# Patient Record
Sex: Female | Born: 1945 | Race: White | Hispanic: No | Smoking: Current every day smoker
Health system: Southern US, Community
[De-identification: ages and names within clinical notes are randomized; demographics above are authoritative.]

## PROBLEM LIST (undated history)

## (undated) DIAGNOSIS — R7303 Prediabetes: Secondary | ICD-10-CM

## (undated) DIAGNOSIS — E785 Hyperlipidemia, unspecified: Secondary | ICD-10-CM

## (undated) DIAGNOSIS — K219 Gastro-esophageal reflux disease without esophagitis: Secondary | ICD-10-CM

## (undated) DIAGNOSIS — K220 Achalasia of cardia: Secondary | ICD-10-CM

## (undated) DIAGNOSIS — C49A4 Gastrointestinal stromal tumor of large intestine: Secondary | ICD-10-CM

## (undated) DIAGNOSIS — Z803 Family history of malignant neoplasm of breast: Secondary | ICD-10-CM

## (undated) DIAGNOSIS — R0600 Dyspnea, unspecified: Secondary | ICD-10-CM

## (undated) DIAGNOSIS — J40 Bronchitis, not specified as acute or chronic: Secondary | ICD-10-CM

## (undated) DIAGNOSIS — Z8049 Family history of malignant neoplasm of other genital organs: Secondary | ICD-10-CM

## (undated) DIAGNOSIS — Z800 Family history of malignant neoplasm of digestive organs: Secondary | ICD-10-CM

## (undated) DIAGNOSIS — Z801 Family history of malignant neoplasm of trachea, bronchus and lung: Secondary | ICD-10-CM

## (undated) DIAGNOSIS — M1990 Unspecified osteoarthritis, unspecified site: Secondary | ICD-10-CM

## (undated) DIAGNOSIS — N6021 Fibroadenosis of right breast: Secondary | ICD-10-CM

## (undated) HISTORY — DX: Gastrointestinal stromal tumor of large intestine: C49.A4

## (undated) HISTORY — PX: COLONOSCOPY: SHX174

## (undated) HISTORY — DX: Family history of malignant neoplasm of digestive organs: Z80.0

## (undated) HISTORY — DX: Family history of malignant neoplasm of other genital organs: Z80.49

## (undated) HISTORY — DX: Achalasia of cardia: K22.0

## (undated) HISTORY — DX: Unspecified osteoarthritis, unspecified site: M19.90

## (undated) HISTORY — PX: OTHER SURGICAL HISTORY: SHX169

## (undated) HISTORY — DX: Hyperlipidemia, unspecified: E78.5

## (undated) HISTORY — DX: Gastro-esophageal reflux disease without esophagitis: K21.9

## (undated) HISTORY — PX: UMBILICAL HERNIA REPAIR: SHX196

## (undated) HISTORY — PX: ESOPHAGOGASTRODUODENOSCOPY: SHX1529

## (undated) HISTORY — DX: Fibroadenosis of right breast: N60.21

## (undated) HISTORY — DX: Family history of malignant neoplasm of breast: Z80.3

## (undated) HISTORY — DX: Family history of malignant neoplasm of trachea, bronchus and lung: Z80.1

---

## 2013-01-28 HISTORY — PX: COLONOSCOPY: SHX174

## 2013-01-28 HISTORY — PX: ESOPHAGOGASTRODUODENOSCOPY: SHX1529

## 2017-03-14 HISTORY — PX: CATARACT EXTRACTION: SUR2

## 2017-03-28 HISTORY — PX: CATARACT EXTRACTION: SUR2

## 2018-08-30 HISTORY — PX: OTHER SURGICAL HISTORY: SHX169

## 2019-04-24 DIAGNOSIS — C49A4 Gastrointestinal stromal tumor of large intestine: Secondary | ICD-10-CM

## 2019-04-24 HISTORY — PX: UPPER ESOPHAGEAL ENDOSCOPIC ULTRASOUND (EUS): SHX6562

## 2019-04-24 HISTORY — DX: Gastrointestinal stromal tumor of large intestine: C49.A4

## 2019-04-24 HISTORY — PX: ESOPHAGOGASTRODUODENOSCOPY: SHX1529

## 2019-06-11 HISTORY — PX: OTHER SURGICAL HISTORY: SHX169

## 2019-06-14 LAB — PULMONARY FUNCTION TEST

## 2019-06-19 HISTORY — PX: HELLER MYOTOMY: SHX5259

## 2019-06-19 HISTORY — PX: OTHER SURGICAL HISTORY: SHX169

## 2019-09-06 HISTORY — PX: BREAST BIOPSY: SHX20

## 2019-11-14 ENCOUNTER — Encounter: Payer: Self-pay | Admitting: Internal Medicine

## 2020-01-01 ENCOUNTER — Encounter: Payer: Self-pay | Admitting: Gastroenterology

## 2020-01-16 NOTE — Progress Notes (Signed)
Referring Provider: Leonie Douglas, MD Primary Care Physician:  Leonie Douglas, MD Primary Gastroenterologist:  Dr. Gala Romney  Chief Complaint  Patient presents with  . Colonoscopy    HPI:   Cassandra Fowler is a 75 y.o. female presenting today at the request of Leonie Douglas, MD for consult colonoscopy, history of achalasia, and history of GIST.  Patient recently moved from [place] to [place], [place] in October 2021.  All prior GI work-up was completed in [place] either at [facility], [place] [facility], or [facility].  She reports having dysphagia to foods and liquids with sensation of items getting hung in the upper esophagus requiring regurgitation at times.  She has had EGD, swallowing study, manometry, hiatal hernia repair/Niesen fundoplication for GERD, and what sounds like esophageal myotomy.  After esophageal myotomy, she had improvement in dysphagia for couple of months, but symptoms returned.  She has never had esophageal dilation.  She was told her esophagus moves slowly.  Prior to Nissen fundoplication, she had significant acid reflux.  This is much improved with occasional breakthrough symptoms that are well controlled with Tums as needed.  Regarding history of GIST, this was an incidental finding.  States she had 2 GIST tumors in her stomach that were resected laparoscopically in June 2021 at [facility] in [place].  Reports one tumor was 7 cm and the second tumor was 2 cm.  She is following with oncology in [place] who had recommended routine "blood draws and scans" 4 times per year x1 year, 3 times per year x1 year, 2 times per year x1 year, and so long.  She was due for her first scan in October 2021, but she was moving at that time so she was unable to complete this.  She needs referral to oncology locally.  Denies nausea, vomiting, abdominal pain.  Occasional constipation which is well managed with fig bars.  No  diarrhea.  No BRBPR or melena.   Last colonoscopy was at [facility] in [place].  Thinks this may have been 4-5 years ago.  Not sure if she had polyps and does not remember when she was to have repeat colonoscopy.  No unintentional weight loss. Trying to lose weight.  Reports she has lost about 30 pounds over the last year or so.  Limiting fried foods. Eating smaller portions. States she is follow her daughters diet but is not able to give me much information on this. Not eating chocolate daily as she had been.   Past Medical History:  Diagnosis Date  . Achalasia   . Arthritis   . GERD (gastroesophageal reflux disease)   . GIST (gastrointestinal stroma tumor), malignant, colon [facility])    s/p resection of 2 tumors in June 2021 at [facility] in [place]  . HLD (hyperlipidemia)     Past Surgical History:  Procedure Laterality Date  . COLONOSCOPY    . ESOPHAGOGASTRODUODENOSCOPY    . GIST tumor resection  06/2019  . left hand surgery    . tubal ligation    . UMBILICAL HERNIA REPAIR     1970s    Current Outpatient Medications  Medication Sig Dispense Refill  . acetaminophen (TYLENOL) 500 MG tablet Take 500 mg by mouth as needed.    . Ascorbic Acid (VITAMIN C) 1000 MG tablet Take 1,000 mg by mouth daily.    . Cholecalciferol (D3) 50 MCG (2000 UT) TABS Take 1 tablet by mouth daily.    . Ibuprofen 200 MG CAPS Take by mouth  as needed.    . imatinib (GLEEVEC) 400 MG tablet Take 400 mg by mouth daily. Take with meals and large glass of water.Caution:Chemotherapy.    . rosuvastatin (CRESTOR) 10 MG tablet Take 10 mg by mouth daily.     No current facility-administered medications for this visit.    Allergies as of 01/17/2020 - Review Complete 01/17/2020  Allergen Reaction Noted  . Indocin [indomethacin]  01/17/2020  . Tolectin [tolmetin]  01/17/2020  . Wound dressing adhesive  01/17/2020  . Penicillins Swelling and Rash 01/17/2020    Family History  Problem  Relation Age of Onset  . Stomach cancer Mother   . Lung cancer Brother   . Colon cancer Paternal Grandmother        in 89s    Social History   Socioeconomic History  . Marital status: Single    Spouse name: Not on file  . Number of children: Not on file  . Years of education: Not on file  . Highest education level: Not on file  Occupational History  . Not on file  Tobacco Use  . Smoking status: Current Every Day Smoker    Types: Cigarettes  . Smokeless tobacco: Never Used  Vaping Use  . Vaping Use: Never used  Substance and Sexual Activity  . Alcohol use: Yes    Comment: glass of wine once a month   . Drug use: Never  . Sexual activity: Not on file  Other Topics Concern  . Not on file  Social History Narrative  . Not on file   Social Determinants of Health   Financial Resource Strain: Not on file  Food Insecurity: Not on file  Transportation Needs: Not on file  Physical Activity: Not on file  Stress: Not on file  Social Connections: Not on file  Intimate Partner Violence: Not on file    Review of Systems: Gen: Denies any fever, chills, cold or flulike symptoms, lightheadedness, dizziness, presyncope, syncope. CV: Denies chest pain or palpitations. Resp: Denies shortness of breath or cough. GI: See HPI GU : Denies urinary burning, urinary frequency, urinary hesitancy MS: Diffuse intermittent joint pain related to arthritis. Derm: Denies rash Heme: See HPI  Physical Exam: BP 136/80   Pulse 78   Temp (!) 97.3 F (36.3 C) (Temporal)   Ht 4' 10"  (1.473 m)   Wt 170 lb 12.8 oz (77.5 kg)   BMI 35.70 kg/m  General:   Alert and oriented. Pleasant and cooperative. Well-nourished and well-developed.  Head:  Normocephalic and atraumatic. Eyes:  Without icterus, sclera clear and conjunctiva pink.  Ears: Decreased auditory acuity. Lungs:  Clear to auscultation bilaterally. No wheezes, rales, or rhonchi. No distress.  Heart:  S1, S2 present without murmurs  appreciated.  Abdomen:  +BS, soft, non-tender and non-distended. No HSM noted. No guarding or rebound. No masses appreciated.  Rectal:  Deferred  Msk:  Symmetrical without gross deformities. Normal posture. Extremities:  Without edema. Neurologic:  Alert and  oriented x4;  grossly normal neurologically. Skin:  Intact without significant lesions or rashes. Psych: Normal mood and affect.

## 2020-01-17 ENCOUNTER — Encounter: Payer: Self-pay | Admitting: Gastroenterology

## 2020-01-17 ENCOUNTER — Ambulatory Visit (INDEPENDENT_AMBULATORY_CARE_PROVIDER_SITE_OTHER): Payer: Medicare Other | Admitting: Gastroenterology

## 2020-01-17 ENCOUNTER — Other Ambulatory Visit: Payer: Self-pay

## 2020-01-17 DIAGNOSIS — C49A4 Gastrointestinal stromal tumor of large intestine: Secondary | ICD-10-CM | POA: Diagnosis not present

## 2020-01-17 DIAGNOSIS — Z1211 Encounter for screening for malignant neoplasm of colon: Secondary | ICD-10-CM | POA: Diagnosis not present

## 2020-01-17 DIAGNOSIS — K219 Gastro-esophageal reflux disease without esophagitis: Secondary | ICD-10-CM | POA: Insufficient documentation

## 2020-01-17 DIAGNOSIS — R1310 Dysphagia, unspecified: Secondary | ICD-10-CM | POA: Diagnosis not present

## 2020-01-17 HISTORY — DX: Dysphagia, unspecified: R13.10

## 2020-01-17 NOTE — Assessment & Plan Note (Addendum)
Patient reports dysphagia to foods and liquids with history of what sounds like achalasia with prior evaluation and intervention in [place].  No records to review today.  She reports having EGD, swallowing study, esophageal manometry, hiatal hernia repair/Nissen fundoplication, and what sounds like esophageal myotomy last year.  Reports dysphagia symptoms initially improved after myotomy for couple of months, but she had return of symptoms.  Interestingly, she describes the sensation of foods or liquids getting stuck in her upper esophagus rather than lower esophagus and requiring regurgitation. No prior esophageal dilation.   Plan: BPE for further evaluation. Requesting records from [place] for review. Advised to avoid tough textures All meats should be chopped finely Eat slowly, take small bites, chew thoroughly, and drink liquids throughout meals. Do not lie down within 3 hours of eating. Further recommendations to follow.

## 2020-01-17 NOTE — Patient Instructions (Signed)
Will refer you to oncology to follow-up on history of GIST.  We will arrange for her to have a swallowing study at [facility].  I recommend you avoid tough textures. All meats should be chopped finely. Eat slowly, take small bites, chew thoroughly, and drink liquids throughout your meals. Sit upright for 3 hours after meals. If something were to get hung in your esophagus and not come up or go down, proceed to the emergency room.  Follow a GERD diet:  Avoid fried, fatty, greasy, spicy, citrus foods. Avoid caffeine and carbonated beverages. Avoid chocolate. Try eating 4-6 small meals a day rather than 3 large meals. Do not eat within 3 hours of laying down. Prop head of bed up on wood or bricks to create a 6 inch incline.  Continue using Tums as needed for acid reflux.  We are requesting records from [place].  Upon reviewing these, we will have additional recommendations.  It was nice meeting you today!  Aliene Altes, PA-C [facility]

## 2020-01-17 NOTE — Assessment & Plan Note (Addendum)
75 year old female reporting history of 2 GIST tumors found incidentally in her stomach that were resected in June 2021 at [facility] in [place].  Currently on Gleevec.  Reports she was following with oncology in [place] and advised to repeat "scans and blood draws" 4 times per year x1, 3 times per year x1, 2 times per year x1, and so on.  States she was due for her first scan in October 2021, but she was moving from [place] to [place], [place] at that time.  She needs to establish with an oncologist locally.  In general, she is doing well from a GI standpoint other than ongoing dysphagia as discussed above.   We will refer her to oncology. Also requesting records from [place].

## 2020-01-17 NOTE — Assessment & Plan Note (Addendum)
75 year old female referred to [place] today for consult colonoscopy.  Reports her last colonoscopy was a few years ago in [place].  Unclear if she had any colon polyps or when she was to have repeat exam.  Notes occasional constipation that she manages with fig bars.  No other significant lower GI symptoms.  No alarm symptoms.  Paternal grandmother with history of colon cancer in her 22s.  Plan: Request records from [place]. Discussed Benefiber or Metamucil daily to help with bowel regularity.  Patient states she is pleased with how fig bars are working for her.  Further recommendations to follow review of records.

## 2020-01-17 NOTE — Assessment & Plan Note (Signed)
History of GERD.  Patient reports undergoing hernia repair/Niesen fundoplication last year.  GERD symptoms have essentially resolved.  Occasional breakthrough symptoms that are managed well with Tums.  Advise she continue to use Tums as needed and counseled on GERD diet/lifestyle.

## 2020-01-22 ENCOUNTER — Ambulatory Visit (HOSPITAL_COMMUNITY)
Admission: RE | Admit: 2020-01-22 | Discharge: 2020-01-22 | Disposition: A | Discharge status: Home / Self Care | Payer: Medicare Other | Source: Ambulatory Visit | Attending: Gastroenterology | Admitting: Gastroenterology

## 2020-01-22 ENCOUNTER — Other Ambulatory Visit: Payer: Self-pay

## 2020-01-22 DIAGNOSIS — R1310 Dysphagia, unspecified: Secondary | ICD-10-CM | POA: Diagnosis present

## 2020-01-23 ENCOUNTER — Encounter: Payer: Self-pay | Admitting: Gastroenterology

## 2020-01-27 ENCOUNTER — Telehealth: Payer: Self-pay | Admitting: Gastroenterology

## 2020-01-27 DIAGNOSIS — K7581 Nonalcoholic steatohepatitis (NASH): Secondary | ICD-10-CM

## 2020-01-27 NOTE — Telephone Encounter (Signed)
Received and reviewed several results from [facility] in [place], [place] as described below.  EUS with fine-needle biopsy, through-the-scope balloon dilation, and cold forceps biopsy 04/24/2019. Indication: Gastric submucosal mass, dysphagia. Endoscopic findings: Esophagus: The examined esophagus was endoscopically tortuous, dilated with a through-the-scope balloon along the entire esophagus to 18 mm with no effect.  Biopsies from mid and distal esophagus for eosinophilic esophagitis.  No evidence of Barrett's esophagus on my exam. Stomach: The examined stomach had a slightly visible submucosal mass at the fundus. Duodenum: The examined duodenum had endoscopically normal mucosa.  There was a diverticulum near the ampulla. Endosonographic findings: - In the fundus, there is a 2.4 cm x 2.4 cm submucosal mass arising from the muscularis.  It abutted the left liver lobe, but did not appear to invade it.  Fine-needle biopsy was performed. -No endosonographic abnormality in the ampulla. -No sign of endosonographic abnormality in the CBD, common hepatic duct, bifurcation of the common hepatic duct, intrahepatic ducts, or cystic.  CBD measured 5 mm. -Hyperechoic, shadowing structures were seen in the gallbladder, consistent with cholelithiasis. -No sign of significant endosonographic abnormality in the visualized portion of the liver. -No sign of significant endosonographic abnormality in the pancreas head,genu of the pancreas, pancreatic body, pancreatic tail, uncinate process of the pancreas, and pancreatic neck.  Main pancreatic duct measures 1 mm in size in the head. -No lymphadenopathy. Final impression: 1.  2.4 cm x 2.4 cm gastric submucosal mass in the fundus of the stomach.  Likely a GIST.  About of the liver.  Fine-needle biopsy performed and final pathology results pending. 2.  Cholelithiasis. 3.  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  Biopsies obtained and dilation  performed.  Duodenal diverticulum. Pathology: Fine-needle aspiration of gastric submucosal mass: Gastrointestinal stromal tumor, immunohistochemical stains supportive of the diagnosis.  Cores of hepatic parenchyma with:   Steatosis, moderate to severe, predominantly macrovesicular.   Changes consistent with chronic steatohepatitis, mild.   Focal pericellular and sinusoidal fibrosis, consistent with mild steato-fibrosis. Esophagus, distal: Compatible with reflux, no EOE or intraepithelial eosinophils identified. Esophagus, mid: Compatible with reflux, no EOE or intraepithelial eosinophils identified.  Esophagram 05/20/2019: Findings: There are predominant tertiary contractions in the mid and distal esophagus.  There was relative stasis of the contrast media in the esophagus.  There is a small hiatal hernia.  There is no mass or stricture.  There was spontaneous gastroesophageal reflux. Impression: There are tertiary contractions in the mid and distal esophagus and there is a small hiatal hernia.  There is gastroesophageal reflux.  CT chest/abdomen/pelvis 05/30/2019 Impression: 1.  4 x 2 x 2 centimeter exophytic mass arising from the fundal region of the stomach. 2.  No evidence of metastatic disease within the abdomen or pelvis. 3.  There is a 6 mm solitary pulmonary nodule in the left upper lobe which is nonspecific in appearance.  Short-term follow-up of this nodule is suggested. 4.  CBD is dilated..  Recommended correlating with LFTs.  Per findings, evidence of fatty liver, normal spleen, CBD measures 10 mm.  Gallbladder is contracted.  No evidence of pancreatic mass.  States the bile duct dilation may be related to postinflammatory stricture at the ampulla.  06/19/2019: Patient underwent robotic gastric tumor resection of the stomach x2, robotic gastric mobilization with takedown of short gastrics with vessel sealer, hiatal hernia repair, Heller myotomy, dor fundoplication, and EGD.  Repeat EGD  at the end of surgery states endoscope was inserted under direct facilitation into the esophagus and  advanced to the stomach.  No appreciable hiatal hernia.  Loose dor fundoplication was intact.  Staple line was intact with no bleeding.  Mass appeared to have been completely resected. Pathology: Gastric tumor small: GIST, 0.7 cm, negative margins of resection. Gastric tumor, large: GIST, 4.7 cm, negative margins of resection.  Esophagram 06/20/2019: Findings: Esophagus is normal in course and caliber.  No obstruction or stricture.  No esophageal mucosal irregularity or diverticula.  Contrast passes unobstructed through the gastroesophageal junction into the stomach.  There is no extravasated contrast to indicate leak.  No concerning findings in the imaged portion of the proximal stomach.  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  No other findings.  Alicia: Please let patient know I have reviewed results from [facility] in regards to GIST tumor resection and achalasia.  1. Appears margins were negative at the time of GIST resection in June 2021, and she had no evidence of metastatic disease on her CT scans.  We have referred her to oncology for ongoing monitoring. No additional recommendations for history of GIST at this time.  2.  She had Heller myotomy for achalasia.  I ordered BPE at office visit which revealed esophageal dysmotility and slight dilation of lower esophagus.  I have sent message to Dr. Laural Golden to inquire about additional options for management of achalasia.  Further recommendations to follow. 3.  Appears some liver tissue was obtained at the time of biopsying GIST tumor.  Liver pathology with chronic steatohepatitis and mild fibrosis. Suspect this is likely secondary to chronic fatty liver. To follow-up on this, I would like to obtain abdominal US with elastography and update labs including CBC and CMP.  4. On her CT scan in May, her common bile duct was dilated. We will be able to  reassess this with the [place].   Alicia: Please arrange CBC and CMP. Dx: steatohepatitis and mild steato-fibrosis on liver biopsy.  [facility] Clinical Pool: Please arrange US abdomen complete with elastography. Dx: steatohepatitis and mild steato-fibrosis on liver biopsy.   Manuela Schwartz: We have not received colonoscopy records. Patient reported colonoscopy was completed at [facility] in [place]. Did we request these results?

## 2020-01-29 ENCOUNTER — Other Ambulatory Visit: Payer: Self-pay

## 2020-01-29 DIAGNOSIS — D214 Benign neoplasm of connective and other soft tissue of abdomen: Secondary | ICD-10-CM

## 2020-01-29 DIAGNOSIS — K760 Fatty (change of) liver, not elsewhere classified: Secondary | ICD-10-CM

## 2020-01-29 NOTE — Telephone Encounter (Signed)
Noted  

## 2020-01-29 NOTE — Telephone Encounter (Signed)
Spoke with pt. Pt was notified that results were reviewed from [facility]. Pt is going to complete labs as directed at [facility] hospital and will complete the U/s abd complete with electrography. Pt doesn't want to have u/s this week due to the weather. Lab orders faxed to [facility] lab

## 2020-01-29 NOTE — Telephone Encounter (Signed)
[place] scheduled for 1/25 at 10:30am, arrival 10:15am, npo midnight Called pt, left detailed message on VM with appt details. Also provided CS # to r/s if needed.

## 2020-01-29 NOTE — Addendum Note (Signed)
Addended by: Cheron Every on: 01/29/2020 12:03 PM   Modules accepted: Orders

## 2020-01-29 NOTE — Telephone Encounter (Signed)
I have faxed another release of records for her last TCS and path from [facility].

## 2020-02-04 ENCOUNTER — Other Ambulatory Visit (HOSPITAL_COMMUNITY)
Admission: RE | Admit: 2020-02-04 | Discharge: 2020-02-04 | Disposition: A | Discharge status: Home / Self Care | Payer: Medicare Other | Source: Ambulatory Visit | Attending: Gastroenterology | Admitting: Gastroenterology

## 2020-02-04 ENCOUNTER — Telehealth: Payer: Self-pay | Admitting: Gastroenterology

## 2020-02-04 ENCOUNTER — Ambulatory Visit (HOSPITAL_COMMUNITY)
Admission: RE | Admit: 2020-02-04 | Discharge: 2020-02-04 | Disposition: A | Discharge status: Home / Self Care | Payer: Medicare Other | Source: Ambulatory Visit | Attending: Gastroenterology | Admitting: Gastroenterology

## 2020-02-04 ENCOUNTER — Encounter: Payer: Self-pay | Admitting: Gastroenterology

## 2020-02-04 ENCOUNTER — Other Ambulatory Visit: Payer: Self-pay

## 2020-02-04 DIAGNOSIS — D214 Benign neoplasm of connective and other soft tissue of abdomen: Secondary | ICD-10-CM | POA: Insufficient documentation

## 2020-02-04 DIAGNOSIS — K760 Fatty (change of) liver, not elsewhere classified: Secondary | ICD-10-CM | POA: Insufficient documentation

## 2020-02-04 DIAGNOSIS — K7581 Nonalcoholic steatohepatitis (NASH): Secondary | ICD-10-CM | POA: Diagnosis not present

## 2020-02-04 LAB — COMPREHENSIVE METABOLIC PANEL
ALT: 15 U/L (ref 0–44)
AST: 14 U/L — ABNORMAL LOW (ref 15–41)
Albumin: 4.1 g/dL (ref 3.5–5.0)
Alkaline Phosphatase: 52 U/L (ref 38–126)
Anion gap: 8 (ref 5–15)
BUN: 11 mg/dL (ref 8–23)
CO2: 27 mmol/L (ref 22–32)
Calcium: 9.2 mg/dL (ref 8.9–10.3)
Chloride: 105 mmol/L (ref 98–111)
Creatinine, Ser: 0.88 mg/dL (ref 0.44–1.00)
GFR, Estimated: >60 mL/min (ref >60)
Glucose, Bld: 103 mg/dL — ABNORMAL HIGH (ref 70–99)
Potassium: 3.9 mmol/L (ref 3.5–5.1)
Sodium: 140 mmol/L (ref 135–145)
Total Bilirubin: 0.9 mg/dL (ref 0.3–1.2)
Total Protein: 6.8 g/dL (ref 6.5–8.1)

## 2020-02-04 LAB — CBC WITH DIFFERENTIAL/PLATELET
Abs Immature Granulocytes: 0.04 10*3/uL (ref 0.00–0.07)
Basophils Absolute: 0 10*3/uL (ref 0.0–0.1)
Basophils Relative: 0 %
Eosinophils Absolute: 0.2 10*3/uL (ref 0.0–0.5)
Eosinophils Relative: 2 %
HCT: 40.4 % (ref 36.0–46.0)
Hemoglobin: 13.5 g/dL (ref 12.0–15.0)
Immature Granulocytes: 0 %
Lymphocytes Relative: 18 %
Lymphs Abs: 1.7 10*3/uL (ref 0.7–4.0)
MCH: 32.8 pg (ref 26.0–34.0)
MCHC: 33.4 g/dL (ref 30.0–36.0)
MCV: 98.3 fL (ref 80.0–100.0)
Monocytes Absolute: 0.7 10*3/uL (ref 0.1–1.0)
Monocytes Relative: 7 %
Neutro Abs: 7.2 10*3/uL (ref 1.7–7.7)
Neutrophils Relative %: 73 %
Platelets: 244 10*3/uL (ref 150–400)
RBC: 4.11 MIL/uL (ref 3.87–5.11)
RDW: 14.2 % (ref 11.5–15.5)
WBC: 9.9 10*3/uL (ref 4.0–10.5)
nRBC: 0 % (ref 0.0–0.2)

## 2020-02-04 NOTE — Telephone Encounter (Signed)
Noted. Will call patient once we receive March schedule

## 2020-02-04 NOTE — Telephone Encounter (Signed)
Received and reviewed colonoscopy report from [facility] in [place], [place]  Colonoscopy 01/28/2013 by Dr. Ok Edwards Indication: Screening Summary: Normal cecum, normal transverse colon, normal descending colon, normal rectum, single flat polyp found in the mid ascending colon removed, single polyp in the mid sigmoid colon removed, 2 polyps in the distal sigmoid removed mild diverticulosis in the sigmoid colon, single polyp in the colon. Recommendations: Repeat colonoscopy in 3 years  Pathology: Sigmoid colon polyp: Hyperplastic colonic polyp Right colon polyp: Sessile serrated adenoma Sigmoid colon, sessile serrated adenoma Polyps, distal sigmoid colon: Diminutive tubular adenoma with low-grade dysplasia, sessile; excised in its entirety.  A hyperplastic colonic polyp with some features suggesting possible development of sessile serrated adenoma; however, these features are not well-developed.  EGD 01/28/2013 Indication: Intermittent pharyngeal esophageal dysphagia Summary: Normal proximal third of the esophagus, middle third of the esophagus, and distal third of the esophagus.  A hiatus hernia was found at the GE junction.  Normal antrum, body of stomach, fundus, and pylorus.  Normal duodenal bulb, second portion of the duodenum, and third portion of the duodenum.  Does not appear dilation was performed.  I discussed case with Dr. Gala Romney who states we can offer patient EGD with large bore dilation although not sure if this would be helpful and certainly not definitive therapy. Did not recommend nitrates or calcium channel blockers.  Recommended further evaluation of dysphagia [facility]. He did say she would need periodic EGDs due to increased risk of esophageal cancer and GIST surveillance.  Discussed with patient. She would like to try esophageal dilation and go from there. She is over due for repeat colonoscopy and would like to have both procedures completed at the same time.  [facility]  Clinical Pool: Please arrange EGD + dilation + colonoscopy with propofol with Dr. Gala Romney. ASA II Dx: History of colon polyps, achalasia, dysphagia, history of GIST *Patient prefers to be scheduled in mid to latter part of March 2022. * Propofol due to prior procedures being completed with propofol.

## 2020-02-05 NOTE — Telephone Encounter (Signed)
PA submitted via [facility] website. Pending review. Tracking# [id number]

## 2020-02-05 NOTE — Telephone Encounter (Signed)
Called pt. She has been scheduled for 3/24, am appt. Aware she will be told by hospital time to arrive. Advised will mail prep instructions with covid test appt. Confirmed mailing address.

## 2020-02-06 NOTE — Telephone Encounter (Signed)
PA approved. Auth# [id number] DOS 04/02/2020-07/01/20

## 2020-02-10 ENCOUNTER — Inpatient Hospital Stay (HOSPITAL_COMMUNITY): Payer: Medicare Other | Admitting: Hematology

## 2020-03-05 ENCOUNTER — Other Ambulatory Visit: Payer: Self-pay

## 2020-03-05 ENCOUNTER — Inpatient Hospital Stay (HOSPITAL_COMMUNITY): Payer: Medicare Other | Attending: Hematology | Admitting: Hematology

## 2020-03-05 VITALS — BP 154/83 | HR 70 | Temp 98.1°F | Resp 17 | Ht <= 58 in | Wt 166.2 lb

## 2020-03-05 DIAGNOSIS — E785 Hyperlipidemia, unspecified: Secondary | ICD-10-CM | POA: Diagnosis not present

## 2020-03-05 DIAGNOSIS — N911 Secondary amenorrhea: Secondary | ICD-10-CM

## 2020-03-05 DIAGNOSIS — K219 Gastro-esophageal reflux disease without esophagitis: Secondary | ICD-10-CM

## 2020-03-05 DIAGNOSIS — Z79899 Other long term (current) drug therapy: Secondary | ICD-10-CM | POA: Diagnosis not present

## 2020-03-05 DIAGNOSIS — N6091 Unspecified benign mammary dysplasia of right breast: Secondary | ICD-10-CM | POA: Diagnosis not present

## 2020-03-05 DIAGNOSIS — F17210 Nicotine dependence, cigarettes, uncomplicated: Secondary | ICD-10-CM

## 2020-03-05 DIAGNOSIS — Z800 Family history of malignant neoplasm of digestive organs: Secondary | ICD-10-CM | POA: Insufficient documentation

## 2020-03-05 DIAGNOSIS — R911 Solitary pulmonary nodule: Secondary | ICD-10-CM | POA: Diagnosis not present

## 2020-03-05 DIAGNOSIS — C49A2 Gastrointestinal stromal tumor of stomach: Secondary | ICD-10-CM | POA: Insufficient documentation

## 2020-03-05 DIAGNOSIS — Z803 Family history of malignant neoplasm of breast: Secondary | ICD-10-CM | POA: Diagnosis not present

## 2020-03-05 DIAGNOSIS — Z801 Family history of malignant neoplasm of trachea, bronchus and lung: Secondary | ICD-10-CM | POA: Diagnosis not present

## 2020-03-05 DIAGNOSIS — Z8509 Personal history of malignant neoplasm of other digestive organs: Secondary | ICD-10-CM

## 2020-03-05 NOTE — Patient Instructions (Signed)
[place] at [facility] Discharge Instructions  You were seen and examined today by Dr. Delton Coombes. Dr. Delton Coombes is a medical oncologist, meaning he specializes in the management of cancer diagnoses with medication. Dr. Delton Coombes discussed your past medical history, family history of cancer and the events that led to you being here today. You were referred to Dr. Delton Coombes to establish ongoing oncology care for your known GIST.  Dr. Delton Coombes has recommended at CT of your chest, abdomen and pelvis just for surveillance. Dr. Delton Coombes also discussed genetic testing with you. This involves meeting with a genetic counselor and a blood test to see if there are any genetic markers that place you at an increased risk for cancer development. This could be helpful for future treatment options as well as helpful for your family.  Follow-up as scheduled.   Thank you for choosing [place] at [facility] to provide your oncology and hematology care.  To afford each patient quality time with our provider, please arrive at least 15 minutes before your scheduled appointment time.   If you have a lab appointment with the [facility] please come in thru the Main Entrance and check in at the main information desk.  You need to re-schedule your appointment should you arrive 10 or more minutes late.  We strive to give you quality time with our providers, and arriving late affects you and other patients whose appointments are after yours.  Also, if you no show three or more times for appointments you may be dismissed from the clinic at the providers discretion.     Again, thank you for choosing [facility].  Our hope is that these requests will decrease the amount of time that you wait before being seen by our physicians.       _____________________________________________________________  Should you have questions after your visit to [facility], please contact our office at [phone] and follow the prompts.  Our office hours are 8:00 a.m. and 4:30 p.m. Monday - Friday.  Please note that voicemails left after 4:00 p.m. may not be returned until the following business day.  We are closed weekends and major holidays.  You do have access to a nurse 24-7, just call the main number to the clinic [phone] and do not press any options, hold on the line and a nurse will answer the phone.    For prescription refill requests, have your pharmacy contact our office and allow 72 hours.    Due to Covid, you will need to wear a mask upon entering the hospital. If you do not have a mask, a mask will be given to you at the Main Entrance upon arrival. For doctor visits, patients may have 1 support person age 60 or older with them. For treatment visits, patients can not have anyone with them due to social distancing guidelines and our immunocompromised population.

## 2020-03-05 NOTE — Progress Notes (Signed)
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 75 y.o. 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 Manus Rudd. She is scheduled to have a colonoscopy with Dr. Gala Romney on 03/24. She had gastric resection in 06/2019 in [facility] in [place], subsequently followed by oncology in [place] and is currently taking Gleevec.  Today she reports feeling well. She is taking Gleevec 400 mg which she started around 06/2019 and tolerating it well. She notes having occasional leg swelling but denies having nausea. She denies having any changes in her overall health. She denies having orthopnea, PND, history of MI's or CVA. She did not go through genetic testing.  She moved to [facility] in 10/2019. She is currently retired; she used to work as a Quarry manager in [employer]. She quit smoking in 2019 and started back up on 10/2019 smoking 1 PPD. Mother had colon cancer; brother died from lung cancer; 2 maternal aunts had breast cancer; PGM had colon cancer; daughter had vaginal and uterine cancer and breast cancer.  MEDICAL HISTORY:  Past Medical History:  Diagnosis Date  . Achalasia   . Arthritis   . GERD (gastroesophageal reflux disease)   . GIST (gastrointestinal stroma tumor), malignant, colon ([place]) 04/24/2019   s/p resection of 2 tumors in June 2021 at [facility] in [place]  . HLD (hyperlipidemia)     SURGICAL HISTORY: Past Surgical History:  Procedure Laterality Date  . BREAST BIOPSY Right 09/06/2019   [facility]; evaluation of suspicious calcification of right breast; pathology with atypical ductal hyperplasia, focal cystic  dilation of ducts with columnar cell change of epithelium, presence of microcalcifications.  Marland Kitchen CATARACT EXTRACTION Right 03/28/2017   [facility]  . CATARACT EXTRACTION Left 03/14/2017   [facility]  . COLONOSCOPY  01/28/2013   Dr. Ok Edwards, [place], [place]; Hyperplastic polyp x2, sessile serrated adenoma x2, tubular adenoma with low-grade dysplasia x1.  Recommend repeat colonoscopy in 3 years.  . ESOPHAGOGASTRODUODENOSCOPY  04/24/2019   [place]; 2.4 cm gastric submucosal mass in the fundus consistent with GIST.  FNA consistent with GIST.  Marland Kitchen ESOPHAGOGASTRODUODENOSCOPY  01/28/2013   Dr. Gwen Her Devers in [place], [place]; normal esophagus, hernia at GE junction, normal examined stomach, normal examined duodenum.  Does not appear dilation was performed.  Marland Kitchen GIST tumor resection  06/19/2019   [facility], robotic assisted laparoscopic partial gastrectomy x2.  Pathology consistent with GIST  . HELLER MYOTOMY  06/19/2019   [facility]; esophageal myotomy with hiatal hernia repair and dor fundoplication  . left hand surgery    . tubal ligation    . UMBILICAL HERNIA REPAIR     1970s  . UPPER ESOPHAGEAL ENDOSCOPIC ULTRASOUND (EUS)  04/24/2019   [facility]; endoscopic findings: Tortuous esophagus s/p balloon dilation to 18 mm with no effect s/p biopsy, submucosal mass in the fundus, normal duodenum; endosonographic findings: 2.4 cm x 2.4 cm submucosal mass in the fundus s/p FNA, cholelithiasis, no hepatic, CBD, or pancreatic abnormalities.  Pathology: GIST, hepatic parenchyma with mild steato-fibrosis, reflux esophagitis  . YAG laser posterior capsulotomy Left 08/30/2018   [facility], left eye.  SOCIAL HISTORY: Social History   Socioeconomic History  . Marital status: Single    Spouse name: Not on file  . Number of children: Not on file  . Years of education: Not on file  .  Highest education level: Not on file  Occupational History  . Not on file  Tobacco Use  . Smoking status: Current Every Day Smoker    Types: Cigarettes  . Smokeless tobacco: Never Used  Vaping Use  . Vaping Use: Never used  Substance and Sexual Activity  . Alcohol use: Yes    Comment: glass of wine once a month   . Drug use: Never  . Sexual activity: Not on file  Other Topics Concern  . Not on file  Social History Narrative  . Not on file   Social Determinants of Health   Financial Resource Strain: Not on file  Food Insecurity: Not on file  Transportation Needs: No Transportation Needs  . Lack of Transportation (Medical): No  . Lack of Transportation (Non-Medical): No  Physical Activity: Inactive  . Days of Exercise per Week: 0 days  . Minutes of Exercise per Session: 0 min  Stress: Not on file  Social Connections: Not on file  Intimate Partner Violence: Not At Risk  . Fear of Current or Ex-Partner: No  . Emotionally Abused: No  . Physically Abused: No  . Sexually Abused: No    FAMILY HISTORY: Family History  Problem Relation Age of Onset  . Stomach cancer Mother   . Lung cancer Brother   . Colon cancer Paternal Grandmother        in 90s    ALLERGIES:  is allergic to tape, indomethacin, tolectin [tolmetin], wound dressing adhesive, and penicillins.  MEDICATIONS:  Current Outpatient Medications  Medication Sig Dispense Refill  . acetaminophen (TYLENOL) 500 MG tablet Take 500 mg by mouth as needed.    . calcium carbonate (OSCAL) 1500 (600 Ca) MG TABS tablet 1 tablet with meals    . Cholecalciferol (D3) 50 MCG (2000 UT) TABS Take 1 tablet by mouth daily.    . Ibuprofen 200 MG CAPS Take by mouth as needed.    . imatinib (GLEEVEC) 400 MG tablet Take 400 mg by mouth daily. Take with meals and large glass of water.Caution:Chemotherapy.    . rosuvastatin (CRESTOR) 10 MG tablet Take 10 mg by mouth daily.    . Ascorbic Acid (VITAMIN C) 1000 MG tablet Take 1,000 mg by  mouth daily. (Patient not taking: Reported on 03/05/2020)     No current facility-administered medications for this visit.    REVIEW OF SYSTEMS:   Review of Systems  Constitutional: Positive for fatigue (50%). Negative for appetite change.  Cardiovascular: Positive for leg swelling (occasional).  Gastrointestinal: Negative for nausea.  Psychiatric/Behavioral: Positive for depression.  All other systems reviewed and are negative.    PHYSICAL EXAMINATION: ECOG PERFORMANCE STATUS: 0 - Asymptomatic  Vitals:   03/05/20 1355  BP: (!) 154/83  Pulse: 70  Resp: 17  Temp: 98.1 F (36.7 C)  SpO2: 99%   Filed Weights   03/05/20 1355  Weight: 166 lb 3 oz (75.4 kg)   Physical Exam Vitals reviewed.  Constitutional:      Appearance: Normal appearance. She is obese.  Cardiovascular:     Rate and Rhythm: Normal rate and regular rhythm.     Pulses: Normal pulses.     Heart sounds: Normal heart sounds.  Pulmonary:     Effort: Pulmonary effort is normal.  Breath sounds: Normal breath sounds.  Chest:  Breasts:     Right: No axillary adenopathy or supraclavicular adenopathy.     Left: No axillary adenopathy or supraclavicular adenopathy.    Abdominal:     Palpations: Abdomen is soft. There is no hepatomegaly, splenomegaly or mass.     Tenderness: There is no abdominal tenderness.     Hernia: No hernia is present.  Musculoskeletal:     Right lower leg: No edema.     Left lower leg: No edema.  Lymphadenopathy:     Cervical: No cervical adenopathy.     Upper Body:     Right upper body: No supraclavicular, axillary or pectoral adenopathy.     Left upper body: No supraclavicular, axillary or pectoral adenopathy.  Neurological:     General: No focal deficit present.     Mental Status: She is alert and oriented to person, place, and time.  Psychiatric:        Mood and Affect: Mood normal.        Behavior: Behavior normal.      LABORATORY DATA:  I have reviewed the data as  listed CBC Latest Ref Rng & Units 02/04/2020  WBC 4.0 - 10.5 K/uL 9.9  Hemoglobin 12.0 - 15.0 g/dL 13.5  Hematocrit 36.0 - 46.0 % 40.4  Platelets 150 - 400 K/uL 244   CMP Latest Ref Rng & Units 02/04/2020  Glucose 70 - 99 mg/dL 103(H)  BUN 8 - 23 mg/dL 11  Creatinine 0.44 - 1.00 mg/dL 0.88  Sodium 135 - 145 mmol/L 140  Potassium 3.5 - 5.1 mmol/L 3.9  Chloride 98 - 111 mmol/L 105  CO2 22 - 32 mmol/L 27  Calcium 8.9 - 10.3 mg/dL 9.2  Total Protein 6.5 - 8.1 g/dL 6.8  Total Bilirubin 0.3 - 1.2 mg/dL 0.9  Alkaline Phos 38 - 126 U/L 52  AST 15 - 41 U/L 14(L)  ALT 0 - 44 U/L 15    RADIOGRAPHIC STUDIES: I have personally reviewed the radiological images as listed and agreed with the findings in the report. No results found.  ASSESSMENT:  1.  Multifocal gastric GIST tumors: -[facility] CAP [facility] [place] shows 4 x 2 x 2 cm mass in fundus with no metastatic disease.  6 mm solitary pulmonary nodule in the left upper lobe. -On 06/19/2019-partial gastrectomy x2, hiatal hernia repair, esophageal myotomy -Pathology shows 2 gastrointestinal stromal tumors, smaller lesion measuring 0.7 cm and larger lesion measuring 4.7 cm, mitotic rate less than 5/33m2 for both lesions, low-grade, very low risk, no lymph nodes found (PT 1 and PT 2 N0). -[facility] 400 mg daily.  2.  Social/family history: -She is a retired [employer] manager  She quit smoking 3 years ago, smoked 1 pack/day. -Mother had colon cancer.  Brother had lung cancer.  2 maternal aunts had breast cancer.  Paternal grandmother had colon cancer.  Daughter had breast cancer, vaginal and uterine cancers.  3.  Right breast atypical ductal hyperplasia: -Status post biopsy on 09/06/2019 of indeterminate microcalcifications 7-8:00 consistent with atypical ductal hyperplasia.   PLAN:  1.  Multifocal gastric GIST tumors: -[facility] -I do not have the mutational status reports. -She appears to be in the very low risk range with a 1.9% metastatic rate.  This prognostic assessment  applies best to KIT negative or PDGFR positive GISTs, whereas SDH deficient GISTs are more unpredictable.  Unsure why she was started on imatinib.  It is possible she was part of a clinical trial. -She seems to be tolerating Gleevec without any major problems. -I have reviewed her recent labs from 02/04/2020.  I have recommended CT CAP with contrast for restaging. -Because of her extensive family history, I have also recommended genetic testing. -We will see her back after the scans.   All questions were answered. The patient knows to call the clinic with any problems, questions or concerns.   Derek Jack, MD, 03/05/20 2:35 PM  [place] [phone]   I, Milinda Antis, am acting as a scribe for Dr. Sanda Linger.  I, Derek Jack MD, have reviewed the above documentation for accuracy and completeness, and I agree with the above.

## 2020-03-10 ENCOUNTER — Other Ambulatory Visit: Payer: Self-pay

## 2020-03-10 ENCOUNTER — Encounter (HOSPITAL_COMMUNITY): Payer: Self-pay | Admitting: Emergency Medicine

## 2020-03-10 ENCOUNTER — Inpatient Hospital Stay (HOSPITAL_COMMUNITY)
Admission: EM | Admit: 2020-03-10 | Discharge: 2020-03-13 | DRG: 446 | Disposition: A | Discharge status: Home / Self Care | Payer: Medicare Other | Attending: Internal Medicine | Admitting: Internal Medicine

## 2020-03-10 ENCOUNTER — Emergency Department (HOSPITAL_COMMUNITY): Payer: Medicare Other

## 2020-03-10 DIAGNOSIS — Z8719 Personal history of other diseases of the digestive system: Secondary | ICD-10-CM

## 2020-03-10 DIAGNOSIS — F17210 Nicotine dependence, cigarettes, uncomplicated: Secondary | ICD-10-CM | POA: Diagnosis present

## 2020-03-10 DIAGNOSIS — Z880 Allergy status to penicillin: Secondary | ICD-10-CM

## 2020-03-10 DIAGNOSIS — Z800 Family history of malignant neoplasm of digestive organs: Secondary | ICD-10-CM | POA: Diagnosis not present

## 2020-03-10 DIAGNOSIS — R109 Unspecified abdominal pain: Secondary | ICD-10-CM | POA: Diagnosis present

## 2020-03-10 DIAGNOSIS — Z91048 Other nonmedicinal substance allergy status: Secondary | ICD-10-CM

## 2020-03-10 DIAGNOSIS — Z20822 Contact with and (suspected) exposure to covid-19: Secondary | ICD-10-CM | POA: Diagnosis present

## 2020-03-10 DIAGNOSIS — Z903 Acquired absence of stomach [part of]: Secondary | ICD-10-CM | POA: Diagnosis not present

## 2020-03-10 DIAGNOSIS — K220 Achalasia of cardia: Secondary | ICD-10-CM | POA: Diagnosis present

## 2020-03-10 DIAGNOSIS — K5730 Diverticulosis of large intestine without perforation or abscess without bleeding: Secondary | ICD-10-CM | POA: Diagnosis present

## 2020-03-10 DIAGNOSIS — E785 Hyperlipidemia, unspecified: Secondary | ICD-10-CM | POA: Diagnosis present

## 2020-03-10 DIAGNOSIS — K838 Other specified diseases of biliary tract: Principal | ICD-10-CM

## 2020-03-10 DIAGNOSIS — Z6834 Body mass index (BMI) 34.0-34.9, adult: Secondary | ICD-10-CM | POA: Diagnosis not present

## 2020-03-10 DIAGNOSIS — Z801 Family history of malignant neoplasm of trachea, bronchus and lung: Secondary | ICD-10-CM | POA: Diagnosis not present

## 2020-03-10 DIAGNOSIS — R1310 Dysphagia, unspecified: Secondary | ICD-10-CM | POA: Diagnosis present

## 2020-03-10 DIAGNOSIS — E669 Obesity, unspecified: Secondary | ICD-10-CM | POA: Diagnosis present

## 2020-03-10 DIAGNOSIS — R1011 Right upper quadrant pain: Secondary | ICD-10-CM

## 2020-03-10 DIAGNOSIS — M1990 Unspecified osteoarthritis, unspecified site: Secondary | ICD-10-CM | POA: Diagnosis present

## 2020-03-10 DIAGNOSIS — M62838 Other muscle spasm: Secondary | ICD-10-CM | POA: Diagnosis present

## 2020-03-10 DIAGNOSIS — K2100 Gastro-esophageal reflux disease with esophagitis, without bleeding: Secondary | ICD-10-CM | POA: Diagnosis present

## 2020-03-10 DIAGNOSIS — R10A1 Flank pain, right side: Secondary | ICD-10-CM | POA: Diagnosis present

## 2020-03-10 DIAGNOSIS — D490 Neoplasm of unspecified behavior of digestive system: Secondary | ICD-10-CM | POA: Diagnosis present

## 2020-03-10 DIAGNOSIS — Z9221 Personal history of antineoplastic chemotherapy: Secondary | ICD-10-CM | POA: Diagnosis not present

## 2020-03-10 DIAGNOSIS — Z888 Allergy status to other drugs, medicaments and biological substances status: Secondary | ICD-10-CM | POA: Diagnosis not present

## 2020-03-10 DIAGNOSIS — C49A4 Gastrointestinal stromal tumor of large intestine: Secondary | ICD-10-CM | POA: Diagnosis present

## 2020-03-10 DIAGNOSIS — R001 Bradycardia, unspecified: Secondary | ICD-10-CM | POA: Diagnosis present

## 2020-03-10 DIAGNOSIS — Z85831 Personal history of malignant neoplasm of soft tissue: Secondary | ICD-10-CM

## 2020-03-10 HISTORY — DX: Unspecified abdominal pain: R10.9

## 2020-03-10 HISTORY — DX: Hyperlipidemia, unspecified: E78.5

## 2020-03-10 HISTORY — DX: Other specified diseases of biliary tract: K83.8

## 2020-03-10 LAB — CBC WITH DIFFERENTIAL/PLATELET
Abs Immature Granulocytes: 0.03 10*3/uL (ref 0.00–0.07)
Basophils Absolute: 0 10*3/uL (ref 0.0–0.1)
Basophils Relative: 0 %
Eosinophils Absolute: 0.2 10*3/uL (ref 0.0–0.5)
Eosinophils Relative: 2 %
HCT: 43.4 % (ref 36.0–46.0)
Hemoglobin: 14.4 g/dL (ref 12.0–15.0)
Immature Granulocytes: 0 %
Lymphocytes Relative: 17 %
Lymphs Abs: 1.8 10*3/uL (ref 0.7–4.0)
MCH: 32.4 pg (ref 26.0–34.0)
MCHC: 33.2 g/dL (ref 30.0–36.0)
MCV: 97.5 fL (ref 80.0–100.0)
Monocytes Absolute: 0.8 10*3/uL (ref 0.1–1.0)
Monocytes Relative: 7 %
Neutro Abs: 8.2 10*3/uL — ABNORMAL HIGH (ref 1.7–7.7)
Neutrophils Relative %: 74 %
Platelets: 262 10*3/uL (ref 150–400)
RBC: 4.45 MIL/uL (ref 3.87–5.11)
RDW: 13.8 % (ref 11.5–15.5)
WBC: 11.1 10*3/uL — ABNORMAL HIGH (ref 4.0–10.5)
nRBC: 0 % (ref 0.0–0.2)

## 2020-03-10 LAB — URINALYSIS, ROUTINE W REFLEX MICROSCOPIC
Bilirubin Urine: NEGATIVE
Glucose, UA: NEGATIVE mg/dL
Hgb urine dipstick: NEGATIVE
Ketones, ur: NEGATIVE mg/dL
Leukocytes,Ua: NEGATIVE
Nitrite: NEGATIVE
Protein, ur: NEGATIVE mg/dL
Specific Gravity, Urine: 1.002 — ABNORMAL LOW (ref 1.005–1.030)
pH: 8 (ref 5.0–8.0)

## 2020-03-10 LAB — COMPREHENSIVE METABOLIC PANEL
ALT: 15 U/L (ref 0–44)
AST: 17 U/L (ref 15–41)
Albumin: 4.5 g/dL (ref 3.5–5.0)
Alkaline Phosphatase: 57 U/L (ref 38–126)
Anion gap: 9 (ref 5–15)
BUN: 11 mg/dL (ref 8–23)
CO2: 27 mmol/L (ref 22–32)
Calcium: 9.5 mg/dL (ref 8.9–10.3)
Chloride: 105 mmol/L (ref 98–111)
Creatinine, Ser: 0.76 mg/dL (ref 0.44–1.00)
GFR, Estimated: >60 mL/min (ref >60)
Glucose, Bld: 104 mg/dL — ABNORMAL HIGH (ref 70–99)
Potassium: 4 mmol/L (ref 3.5–5.1)
Sodium: 141 mmol/L (ref 135–145)
Total Bilirubin: 1 mg/dL (ref 0.3–1.2)
Total Protein: 7.4 g/dL (ref 6.5–8.1)

## 2020-03-10 LAB — RESP PANEL BY RT-PCR (FLU A&B, COVID) ARPGX2
Influenza A by PCR: NEGATIVE
Influenza B by PCR: NEGATIVE
SARS Coronavirus 2 by RT PCR: NEGATIVE

## 2020-03-10 LAB — LIPASE, BLOOD: Lipase: 92 U/L — ABNORMAL HIGH (ref 11–51)

## 2020-03-10 MED ORDER — IOHEXOL 300 MG/ML  SOLN
100.0000 mL | Freq: Once | INTRAMUSCULAR | Status: AC | PRN
  Administered 2020-03-10: 100 mL via INTRAVENOUS

## 2020-03-10 MED ORDER — ONDANSETRON HCL 4 MG/2ML IJ SOLN: 4.0000 mg | Freq: Four times a day (QID) | INTRAMUSCULAR | Status: DC | PRN

## 2020-03-10 MED ORDER — MORPHINE SULFATE (PF) 2 MG/ML IV SOLN
2.0000 mg | Freq: Once | INTRAVENOUS | Status: AC
  Administered 2020-03-10: 2 mg via INTRAVENOUS
  Filled 2020-03-10: qty 1

## 2020-03-10 MED ORDER — LACTATED RINGERS IV SOLN: INTRAVENOUS | Status: DC

## 2020-03-10 MED ORDER — ACETAMINOPHEN 650 MG RE SUPP: 650.0000 mg | Freq: Four times a day (QID) | RECTAL | Status: DC | PRN

## 2020-03-10 MED ORDER — POLYETHYLENE GLYCOL 3350 17 G PO PACK: 17.0000 g | PACK | Freq: Every day | ORAL | Status: DC | PRN

## 2020-03-10 MED ORDER — ROSUVASTATIN CALCIUM 10 MG PO TABS
10.0000 mg | ORAL_TABLET | Freq: Every day | ORAL | Status: DC
  Administered 2020-03-11 – 2020-03-13 (×3): 10 mg via ORAL
  Filled 2020-03-10 (×3): qty 1

## 2020-03-10 MED ORDER — HYDROCODONE-ACETAMINOPHEN 5-325 MG PO TABS: 1.0000 | ORAL_TABLET | ORAL | Status: DC | PRN

## 2020-03-10 MED ORDER — SODIUM CHLORIDE 0.9 % IV BOLUS
500.0000 mL | Freq: Once | INTRAVENOUS | Status: AC
  Administered 2020-03-10: 500 mL via INTRAVENOUS

## 2020-03-10 MED ORDER — BISACODYL 5 MG PO TBEC: 5.0000 mg | DELAYED_RELEASE_TABLET | Freq: Every day | ORAL | Status: DC | PRN

## 2020-03-10 MED ORDER — MORPHINE SULFATE (PF) 2 MG/ML IV SOLN: 2.0000 mg | INTRAVENOUS | Status: DC | PRN

## 2020-03-10 MED ORDER — ACETAMINOPHEN 325 MG PO TABS
650.0000 mg | ORAL_TABLET | Freq: Four times a day (QID) | ORAL | Status: DC | PRN
  Administered 2020-03-11: 650 mg via ORAL
  Filled 2020-03-10: qty 2

## 2020-03-10 MED ORDER — HYDRALAZINE HCL 20 MG/ML IJ SOLN: 5.0000 mg | INTRAMUSCULAR | Status: DC | PRN

## 2020-03-10 MED ORDER — ONDANSETRON HCL 4 MG PO TABS: 4.0000 mg | ORAL_TABLET | Freq: Four times a day (QID) | ORAL | Status: DC | PRN

## 2020-03-10 MED ORDER — HEPARIN SODIUM (PORCINE) 5000 UNIT/ML IJ SOLN
5000.0000 [IU] | Freq: Three times a day (TID) | INTRAMUSCULAR | Status: DC
  Administered 2020-03-10 – 2020-03-13 (×8): 5000 [IU] via SUBCUTANEOUS
  Filled 2020-03-10 (×8): qty 1

## 2020-03-10 MED ORDER — DOCUSATE SODIUM 100 MG PO CAPS
100.0000 mg | ORAL_CAPSULE | Freq: Two times a day (BID) | ORAL | Status: DC
  Administered 2020-03-10 – 2020-03-13 (×6): 100 mg via ORAL
  Filled 2020-03-10 (×6): qty 1

## 2020-03-10 NOTE — ED Triage Notes (Signed)
Pt c/o right sided flank pain that started this morning.

## 2020-03-10 NOTE — ED Notes (Signed)
ED TO INPATIENT HANDOFF REPORT  ED Nurse Name and Phone #:   S Name/Age/Gender Cassandra Fowler 75 y.o. female Room/Bed: APFT21/APFT21  Code Status   Code Status: Not on file  Home/SNF/Other Home Patient oriented to: self, place, time and situation Is this baseline? Yes   Triage Complete: Triage complete  Chief Complaint Common bile duct dilatation [K83.8]  Triage Note Pt c/o right sided flank pain that started this morning.     Allergies Allergies  Allergen Reactions  . Tape     Causes Rash  . Indomethacin Hives  . Tolectin [Tolmetin]   . Wound Dressing Adhesive     Adhesive tape   . Penicillins Swelling and Rash    Level of Care/Admitting Diagnosis ED Disposition    ED Disposition Condition [place] [facility]: [facility] [[id number]]  Level of Care: Med-Surg [16]  Covid Evaluation: Asymptomatic Screening Protocol (No Symptoms)  Diagnosis: Common bile duct dilatation [016010]  Admitting Physician: Karmen Bongo [[id number]]  Attending Physician: Karmen Bongo [[id number]]  Estimated length of stay: past midnight tomorrow  Certification:: I certify this patient will need inpatient services for at least 2 midnights       B Medical/Surgery History Past Medical History:  Diagnosis Date  . Achalasia   . Arthritis   . GERD (gastroesophageal reflux disease)   . GIST (gastrointestinal stroma tumor), malignant, colon ([place]) 04/24/2019   s/p resection of 2 tumors in June 2021 at [facility] in [place]  . HLD (hyperlipidemia)    Past Surgical History:  Procedure Laterality Date  . BREAST BIOPSY Right 09/06/2019   [facility]; evaluation of suspicious calcification of right breast; pathology with atypical ductal hyperplasia, focal cystic dilation of ducts with columnar cell change of epithelium, presence of microcalcifications.  Marland Kitchen CATARACT EXTRACTION Right 03/28/2017   [facility]  . CATARACT EXTRACTION Left  03/14/2017   [facility]  . COLONOSCOPY  01/28/2013   Dr. Ok Edwards, [place], [place]; Hyperplastic polyp x2, sessile serrated adenoma x2, tubular adenoma with low-grade dysplasia x1.  Recommend repeat colonoscopy in 3 years.  . ESOPHAGOGASTRODUODENOSCOPY  04/24/2019   [place]; 2.4 cm gastric submucosal mass in the fundus consistent with GIST.  FNA consistent with GIST.  Marland Kitchen ESOPHAGOGASTRODUODENOSCOPY  01/28/2013   Dr. Gwen Her Devers in [place], [place]; normal esophagus, hernia at GE junction, normal examined stomach, normal examined duodenum.  Does not appear dilation was performed.  Marland Kitchen GIST tumor resection  06/19/2019   [facility], robotic assisted laparoscopic partial gastrectomy x2.  Pathology consistent with GIST  . HELLER MYOTOMY  06/19/2019   [facility]; esophageal myotomy with hiatal hernia repair and dor fundoplication  . left hand surgery    . tubal ligation    . UMBILICAL HERNIA REPAIR     1970s  . UPPER ESOPHAGEAL ENDOSCOPIC ULTRASOUND (EUS)  04/24/2019   [facility]; endoscopic findings: Tortuous esophagus s/p balloon dilation to 18 mm with no effect s/p biopsy, submucosal mass in the fundus, normal duodenum; endosonographic findings: 2.4 cm x 2.4 cm submucosal mass in the fundus s/p FNA, cholelithiasis, no hepatic, CBD, or pancreatic abnormalities.  Pathology: GIST, hepatic parenchyma with mild steato-fibrosis, reflux esophagitis  . YAG laser posterior capsulotomy Left 08/30/2018   [facility], left eye.     A IV Location/Drains/Wounds Patient Lines/Drains/Airways Status    Active Line/Drains/Airways    Name Placement date Placement time Site Days   Peripheral IV 03/10/20 Right 03/10/20  1327  --  less than 1          Intake/Output Last 24 hours  Intake/Output Summary (Last 24 hours) at 03/10/2020 1900 Last data filed at 03/10/2020 1713 Gross per 24 hour  Intake 500 ml  Output  --  Net 500 ml    Labs/Imaging Results for orders placed or performed during the hospital encounter of 03/10/20 (from the past 48 hour(s))  CBC with Differential     Status: Abnormal   Collection Time: 03/10/20  1:28 PM  Result Value Ref Range   WBC 11.1 (H) 4.0 - 10.5 K/uL   RBC 4.45 3.87 - 5.11 MIL/uL   Hemoglobin 14.4 12.0 - 15.0 g/dL   HCT 43.4 36.0 - 46.0 %   MCV 97.5 80.0 - 100.0 fL   MCH 32.4 26.0 - 34.0 pg   MCHC 33.2 30.0 - 36.0 g/dL   RDW 13.8 11.5 - 15.5 %   Platelets 262 150 - 400 K/uL   nRBC 0.0 0.0 - 0.2 %   Neutrophils Relative % 74 %   Neutro Abs 8.2 (H) 1.7 - 7.7 K/uL   Lymphocytes Relative 17 %   Lymphs Abs 1.8 0.7 - 4.0 K/uL   Monocytes Relative 7 %   Monocytes Absolute 0.8 0.1 - 1.0 K/uL   Eosinophils Relative 2 %   Eosinophils Absolute 0.2 0.0 - 0.5 K/uL   Basophils Relative 0 %   Basophils Absolute 0.0 0.0 - 0.1 K/uL   Immature Granulocytes 0 %   Abs Immature Granulocytes 0.03 0.00 - 0.07 K/uL    Comment: Performed at [facility], [address] [place], [place] [address]  Comprehensive metabolic panel     Status: Abnormal   Collection Time: 03/10/20  1:28 PM  Result Value Ref Range   Sodium 141 135 - 145 mmol/L   Potassium 4.0 3.5 - 5.1 mmol/L   Chloride 105 98 - 111 mmol/L   CO2 27 22 - 32 mmol/L   Glucose, Bld 104 (H) 70 - 99 mg/dL    Comment: Glucose reference range applies only to samples taken after fasting for at least 8 hours.   BUN 11 8 - 23 mg/dL   Creatinine, Ser 0.76 0.44 - 1.00 mg/dL   Calcium 9.5 8.9 - 10.3 mg/dL   Total Protein 7.4 6.5 - 8.1 g/dL   Albumin 4.5 3.5 - 5.0 g/dL   AST 17 15 - 41 U/L   ALT 15 0 - 44 U/L   Alkaline Phosphatase 57 38 - 126 U/L   Total Bilirubin 1.0 0.3 - 1.2 mg/dL   GFR, Estimated >60 >60 mL/min    Comment: (NOTE) Calculated using the CKD-EPI Creatinine Equation (2021)    Anion gap 9 5 - 15    Comment: Performed at [facility], [address], [place], [place] [address]  Lipase, blood     Status:  Abnormal   Collection Time: 03/10/20  1:28 PM  Result Value Ref Range   Lipase 92 (H) 11 - 51 U/L    Comment: Performed at [facility], [address], [place], [place] [address]  Urinalysis, Routine w reflex microscopic Urine, Clean Catch     Status: Abnormal   Collection Time: 03/10/20  1:28 PM  Result Value Ref Range   Color, Urine STRAW (A) YELLOW   APPearance CLEAR CLEAR   Specific Gravity, Urine 1.002 (L) 1.005 - 1.030   pH 8.0 5.0 - 8.0   Glucose, UA NEGATIVE NEGATIVE mg/dL  Hgb urine dipstick NEGATIVE NEGATIVE   Bilirubin Urine NEGATIVE NEGATIVE   Ketones, ur NEGATIVE NEGATIVE mg/dL   Protein, ur NEGATIVE NEGATIVE mg/dL   Nitrite NEGATIVE NEGATIVE   Leukocytes,Ua NEGATIVE NEGATIVE    Comment: Performed at [facility], [address] [place], [place] [address]  Resp Panel by RT-PCR (Flu A&B, Covid) Nasopharyngeal Swab     Status: None   Collection Time: 03/10/20  6:07 PM   Specimen: Nasopharyngeal Swab; Nasopharyngeal(NP) swabs in vial transport medium  Result Value Ref Range   SARS Coronavirus 2 by RT PCR NEGATIVE NEGATIVE    Comment: (NOTE) SARS-CoV-2 target nucleic acids are NOT DETECTED.  The SARS-CoV-2 RNA is generally detectable in upper respiratory specimens during the acute phase of infection. The lowest concentration of SARS-CoV-2 viral copies this assay can detect is 138 copies/mL. A negative result does not preclude SARS-Cov-2 infection and should not be used as the sole basis for treatment or other patient management decisions. A negative result may occur with  improper specimen collection/handling, submission of specimen other than nasopharyngeal swab, presence of viral mutation(s) within the areas targeted by this assay, and inadequate number of viral copies(<138 copies/mL). A negative result must be combined with clinical observations, patient history, and epidemiological information. The expected result is Negative.  Fact Sheet for Patients:   EntrepreneurPulse.com.au  Fact Sheet for Healthcare Providers:  IncredibleEmployment.be  This test is no t yet approved or cleared by the [place] FDA and  has been authorized for detection and/or diagnosis of SARS-CoV-2 by FDA under an Emergency Use Authorization (EUA). This EUA will remain  in effect (meaning this test can be used) for the duration of the COVID-19 declaration under Section 564(b)(1) of the Act, 21 U.S.C.section 360bbb-3(b)(1), unless the authorization is terminated  or revoked sooner.       Influenza A by PCR NEGATIVE NEGATIVE   Influenza B by PCR NEGATIVE NEGATIVE    Comment: (NOTE) The Xpert Xpress SARS-CoV-2/FLU/RSV plus assay is intended as an aid in the diagnosis of influenza from Nasopharyngeal swab specimens and should not be used as a sole basis for treatment. Nasal washings and aspirates are unacceptable for Xpert Xpress SARS-CoV-2/FLU/RSV testing.  Fact Sheet for Patients: EntrepreneurPulse.com.au  Fact Sheet for Healthcare Providers: IncredibleEmployment.be  This test is not yet approved or cleared by the [place] FDA and has been authorized for detection and/or diagnosis of SARS-CoV-2 by FDA under an Emergency Use Authorization (EUA). This EUA will remain in effect (meaning this test can be used) for the duration of the COVID-19 declaration under Section 564(b)(1) of the Act, 21 U.S.C. section 360bbb-3(b)(1), unless the authorization is terminated or revoked.  Performed at [facility], [address] [place], [place] [address]    CT ABDOMEN PELVIS W CONTRAST  Result Date: 03/10/2020 CLINICAL DATA:  Right lower quadrant pain. EXAM: CT ABDOMEN AND PELVIS WITH CONTRAST TECHNIQUE: Multidetector CT imaging of the abdomen and pelvis was performed using the standard protocol following bolus administration of intravenous contrast. CONTRAST:  132m OMNIPAQUE IOHEXOL 300  MG/ML  SOLN COMPARISON:  None. FINDINGS: Lower chest: Unremarkable. Hepatobiliary: No suspicious focal abnormality within the liver parenchyma. Despite lack of substantial intrahepatic biliary duct dilatation. The extrahepatic common bile duct in the head of pancreas is dilated up to 11 mm. Pancreas: No focal mass lesion. No dilatation of the main duct. No intraparenchymal cyst. No peripancreatic edema. Spleen: No splenomegaly. No focal mass lesion. Adrenals/Urinary Tract: No adrenal nodule or mass. 4.2 cm cyst  noted upper pole left kidney additional scattered tiny hypodensities in the left kidney are too small to characterize but likely benign cysts. No evidence for hydroureter. The urinary bladder appears normal for the degree of distention. Stomach/Bowel: Surgical changes are noted in the region of the proximal stomach. Duodenum is normally positioned as is the ligament of Treitz. No small bowel wall thickening. No small bowel dilatation. The terminal ileum is normal. The appendix is not well visualized, but there is no edema or inflammation in the region of the cecum. No gross colonic mass. No colonic wall thickening. Diverticular changes are noted in the left colon without evidence of diverticulitis. Vascular/Lymphatic: There is abdominal aortic atherosclerosis without aneurysm. There is no gastrohepatic or hepatoduodenal ligament lymphadenopathy. No retroperitoneal or mesenteric lymphadenopathy. No pelvic sidewall lymphadenopathy. Reproductive: The uterus is surgically absent. There is no adnexal mass. Other: No intraperitoneal free fluid. Musculoskeletal: No worrisome lytic or sclerotic osseous abnormality. IMPRESSION: 1. No acute findings in the abdomen or pelvis. Specifically, no findings to explain the patient's history of right lower quadrant pain. 2. Common bile duct dilatation up to 11 mm diameter. No obstructing stone or mass lesion evident and no associated dilatation of the main pancreatic duct. MRCP  could be used to further evaluate as clinically warranted. 3. Left colonic diverticulosis without diverticulitis. 4. Aortic Atherosclerosis (ICD10-I70.0). Electronically Signed   By: Misty Stanley M.D.   On: 03/10/2020 15:38   US Abdomen Limited RUQ (LIVER/GB)  Result Date: 03/10/2020 CLINICAL DATA:  Right upper quadrant pain EXAM: ULTRASOUND ABDOMEN LIMITED RIGHT UPPER QUADRANT COMPARISON:  CT 03/10/2020.  Ultrasound 02/04/2020. FINDINGS: Gallbladder: Gallbladder wall calcifications again noted along the anterior gallbladder wall with comet tail artifact. Common bile duct: Diameter: Prominent measuring up to 11 mm as seen on earlier CT. No visible ductal stones. The distal duct is obscured by overlying bowel gas. Liver: No focal lesion identified. Within normal limits in parenchymal echogenicity. Portal vein is patent on color Doppler imaging with normal direction of blood flow towards the liver. Other: None. IMPRESSION: Prominent common bile duct measuring up to 11 mm. No visible ductal stones although the distal duct is obscured by overlying bowel gas. 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 Electronically Signed   By: Rolm Baptise M.D.   On: 03/10/2020 16:59    Pending Labs Unresulted Labs (From admission, onward)         None      Vitals/Pain Today's Vitals   03/10/20 1233 03/10/20 1329 03/10/20 1350 03/10/20 1611  BP: (!) 143/118  (!) 126/99 130/79  Pulse: 71  68 64  Resp: 20  18 16   Temp: 97.6 F (36.4 C)     TempSrc: Oral     SpO2: 98%  99% 96%  Weight:      Height:      PainSc:  5   9     Isolation Precautions No active isolations  Medications Medications  morphine 2 MG/ML injection 2 mg (2 mg Intravenous Given 03/10/20 1343)  sodium chloride 0.9 % bolus 500 mL (0 mLs Intravenous Stopped 03/10/20 1713)  iohexol (OMNIPAQUE) 300 MG/ML solution  100 mL (100 mLs Intravenous Contrast Given 03/10/20 1443)    Mobility walks Low fall risk   Focused Assessments    R Recommendations: See Admitting Provider Note  Report given to:   Additional Notes:

## 2020-03-10 NOTE — ED Notes (Signed)
Ultrasound at bedside

## 2020-03-10 NOTE — ED Notes (Signed)
Attempted report x1. 

## 2020-03-10 NOTE — ED Notes (Signed)
Patient transported to CT 

## 2020-03-10 NOTE — ED Provider Notes (Signed)
[place] Provider Note   CSN: [id number] Arrival date & time: 03/10/20  1225     History Chief Complaint  Patient presents with  . Flank Pain    Cassandra Fowler is a 75 y.o. female history of achalasia, GERD, colon tumor, hyperlipidemia  Patient presents today for right flank pain.  Symptoms have been intermittent for past several months, no clear inciting event.  She reports right flank pain occasionally radiates to her umbilical area intermittent sharp/throbbing moderate intensity no clear aggravating or alleviating factors.  She reports one episode of nausea without vomiting  Denies fever/chills, chest pain/shortness of breath, cough, vomiting, diarrhea, dysuria/hematuria, numbness/weakness, tingling or any additional concerns HPI     Past Medical History:  Diagnosis Date  . Achalasia   . Arthritis   . GERD (gastroesophageal reflux disease)   . 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 HLD (hyperlipidemia)     Patient Active Problem List   Diagnosis Date Noted  . Dysphagia 01/17/2020  . GIST (gastrointestinal stroma tumor), malignant, colon ([place]) 01/17/2020  . Colon cancer screening 01/17/2020  . GERD (gastroesophageal reflux disease) 01/17/2020    Past Surgical History:  Procedure Laterality Date  . BREAST BIOPSY Right 09/06/2019   [facility]; evaluation of suspicious calcification of right breast; pathology with atypical ductal hyperplasia, focal cystic dilation of ducts with columnar cell change of epithelium, presence of microcalcifications.  Marland Kitchen CATARACT EXTRACTION Right 03/28/2017   [facility]  . CATARACT EXTRACTION Left 03/14/2017   [facility]  . COLONOSCOPY  01/28/2013   Dr. Ok Edwards, [place], [place]; Hyperplastic polyp x2, sessile serrated adenoma x2, tubular adenoma with  low-grade dysplasia x1.  Recommend repeat colonoscopy in 3 years.  . ESOPHAGOGASTRODUODENOSCOPY  04/24/2019   [facility]; 2.4 cm gastric submucosal mass in the fundus consistent with GIST.  FNA consistent with GIST.  Marland Kitchen ESOPHAGOGASTRODUODENOSCOPY  01/28/2013   Dr. Gwen Her Devers in [place], [place]; normal esophagus, hernia at GE junction, normal examined stomach, normal examined duodenum.  Does not appear dilation was performed.  Marland Kitchen GIST tumor resection  06/19/2019   [facility], robotic assisted laparoscopic partial gastrectomy x2.  Pathology consistent with GIST  . HELLER MYOTOMY  06/19/2019   [facility]; esophageal myotomy with hiatal hernia repair and dor fundoplication  . left hand surgery    . tubal ligation    . UMBILICAL HERNIA REPAIR     1970s  . UPPER ESOPHAGEAL ENDOSCOPIC ULTRASOUND (EUS)  04/24/2019   [facility]; endoscopic findings: Tortuous esophagus s/p balloon dilation to 18 mm with no effect s/p biopsy, submucosal mass in the fundus, normal duodenum; endosonographic findings: 2.4 cm x 2.4 cm submucosal mass in the fundus s/p FNA, cholelithiasis, no hepatic, CBD, or pancreatic abnormalities.  Pathology: GIST, hepatic parenchyma with mild steato-fibrosis, reflux esophagitis  . YAG laser posterior capsulotomy Left 08/30/2018   [facility], left eye.     OB History   No obstetric history on file.     Family History  Problem Relation Age of Onset  . Stomach cancer Mother   . Lung cancer Brother   . Colon cancer Paternal Grandmother        in 71s    Social History   Tobacco Use  . Smoking status: Current Every Day Smoker    Types: Cigarettes  . Smokeless tobacco: Never Used  Vaping Use  . Vaping Use: Never used  Substance Use Topics  . Alcohol use: Yes    Comment: glass of wine once a month   . Drug use: Never    Home Medications Prior to Admission medications   Medication Sig Start Date  End Date Taking? Authorizing Provider  acetaminophen (TYLENOL) 500 MG tablet Take 500 mg by mouth as needed.    [provider]  Ascorbic Acid (VITAMIN C) 1000 MG tablet Take 1,000 mg by mouth daily. Patient not taking: Reported on 03/05/2020    [provider]  calcium carbonate (OSCAL) 1500 (600 Ca) MG TABS tablet 1 tablet with meals    [provider]  Cholecalciferol (D3) 50 MCG (2000 UT) TABS Take 1 tablet by mouth daily.    [provider]  Ibuprofen 200 MG CAPS Take by mouth as needed.    [provider]  imatinib (GLEEVEC) 400 MG tablet Take 400 mg by mouth daily. Take with meals and large glass of water.Caution:Chemotherapy.    [provider]  rosuvastatin (CRESTOR) 10 MG tablet Take 10 mg by mouth daily.    [provider]    Allergies    Tape, Indomethacin, Tolectin [tolmetin], Wound dressing adhesive, and Penicillins  Review of Systems   Review of Systems Ten systems are reviewed and are negative for acute change except as noted in the HPI  Physical Exam Updated Vital Signs BP 130/79 (BP Location: Right Arm)   Pulse 64   Temp 97.6 F (36.4 C) (Oral)   Resp 16   Ht 4' 10"  (1.473 m)   Wt 75.4 kg   SpO2 96%   BMI 34.74 kg/m   Physical Exam Constitutional:      General: She is not in acute distress.    Appearance: Normal appearance. She is well-developed. She is not ill-appearing or diaphoretic.  HENT:     Head: Normocephalic and atraumatic.  Eyes:     General: Vision grossly intact. Gaze aligned appropriately.     Pupils: Pupils are equal, round, and reactive to light.  Neck:     Trachea: Trachea and phonation normal.  Pulmonary:     Effort: Pulmonary effort is normal. No respiratory distress.  Abdominal:     General: There is no distension.     Palpations: Abdomen is soft.     Tenderness: There is abdominal tenderness in the right upper quadrant, right lower quadrant and periumbilical area. There is  no guarding or rebound. Positive signs include McBurney's sign. Negative signs include Murphy's sign.  Musculoskeletal:        General: Normal range of motion.     Cervical back: Normal range of motion.  Skin:    General: Skin is warm and dry.  Neurological:     Mental Status: She is alert.     GCS: GCS eye subscore is 4. GCS verbal subscore is 5. GCS motor subscore is 6.     Comments: Speech is clear and goal oriented, follows commands Major Cranial nerves without deficit, no facial droop Moves extremities without ataxia, coordination intact  Psychiatric:        Behavior: Behavior normal.     ED Results / Procedures / Treatments   Labs (all labs ordered are listed, but only abnormal results are displayed) Labs Reviewed  CBC WITH DIFFERENTIAL/PLATELET - Abnormal; Notable for the following components:      Result Value   WBC 11.1 (*)    Neutro Abs 8.2 (*)  All other components within normal limits  COMPREHENSIVE METABOLIC PANEL - Abnormal; Notable for the following components:   Glucose, Bld 104 (*)    All other components within normal limits  LIPASE, BLOOD - Abnormal; Notable for the following components:   Lipase 92 (*)    All other components within normal limits  URINALYSIS, ROUTINE W REFLEX MICROSCOPIC - Abnormal; Notable for the following components:   Color, Urine STRAW (*)    Specific Gravity, Urine 1.002 (*)    All other components within normal limits  RESP PANEL BY RT-PCR (FLU A&B, COVID) [id number]    EKG None  Radiology CT ABDOMEN PELVIS W CONTRAST  Result Date: 03/10/2020 CLINICAL DATA:  Right lower quadrant pain. EXAM: CT ABDOMEN AND PELVIS WITH CONTRAST TECHNIQUE: Multidetector CT imaging of the abdomen and pelvis was performed using the standard protocol following bolus administration of intravenous contrast. CONTRAST:  156m OMNIPAQUE IOHEXOL 300 MG/ML  SOLN COMPARISON:  None. FINDINGS: Lower chest: Unremarkable. Hepatobiliary: No suspicious focal  abnormality within the liver parenchyma. Despite lack of substantial intrahepatic biliary duct dilatation. The extrahepatic common bile duct in the head of pancreas is dilated up to 11 mm. Pancreas: No focal mass lesion. No dilatation of the main duct. No intraparenchymal cyst. No peripancreatic edema. Spleen: No splenomegaly. No focal mass lesion. Adrenals/Urinary Tract: No adrenal nodule or mass. 4.2 cm cyst noted upper pole left kidney additional scattered tiny hypodensities in the left kidney are too small to characterize but likely benign cysts. No evidence for hydroureter. The urinary bladder appears normal for the degree of distention. Stomach/Bowel: Surgical changes are noted in the region of the proximal stomach. Duodenum is normally positioned as is the ligament of Treitz. No small bowel wall thickening. No small bowel dilatation. The terminal ileum is normal. The appendix is not well visualized, but there is no edema or inflammation in the region of the cecum. No gross colonic mass. No colonic wall thickening. Diverticular changes are noted in the left colon without evidence of diverticulitis. Vascular/Lymphatic: There is abdominal aortic atherosclerosis without aneurysm. There is no gastrohepatic or hepatoduodenal ligament lymphadenopathy. No retroperitoneal or mesenteric lymphadenopathy. No pelvic sidewall lymphadenopathy. Reproductive: The uterus is surgically absent. There is no adnexal mass. Other: No intraperitoneal free fluid. Musculoskeletal: No worrisome lytic or sclerotic osseous abnormality. IMPRESSION: 1. No acute findings in the abdomen or pelvis. Specifically, no findings to explain the patient's history of right lower quadrant pain. 2. Common bile duct dilatation up to 11 mm diameter. No obstructing stone or mass lesion evident and no associated dilatation of the main pancreatic duct. MRCP could be used to further evaluate as clinically warranted. 3. Left colonic diverticulosis without  diverticulitis. 4. Aortic Atherosclerosis (ICD10-I70.0). Electronically Signed   By: EMisty StanleyM.D.   On: 03/10/2020 15:38   [place]dom[place] Limited RUQ (LIVER/GB)  Result Date: 03/10/2020 CLINICAL DATA:  Right upper quadrant pain EXAM: ULTRASOUND ABDOMEN LIMITED RIGHT UPPER QUADRANT COMPARISON:  CT 03/10/2020.  Ultrasound 02/04/2020. FINDINGS: Gallbladder: Gallbladder wall calcifications again noted along the anterior gallbladder wall with comet tail artifact. Common bile duct: Diameter: Prominent measuring up to 11 mm as seen on earlier CT. No visible ductal stones. The distal duct is obscured by overlying bowel gas. Liver: No focal lesion identified. Within normal limits in parenchymal echogenicity. Portal vein is patent on color Doppler imaging with normal direction of blood flow towards the liver. Other: None. IMPRESSION: Prominent common bile duct measuring up to 11 mm. No visible ductal stones  although the distal duct is obscured by overlying bowel gas. 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 Electronically Signed   By: Rolm Baptise M.D.   On: 03/10/2020 16:59    Procedures Procedures   Medications Ordered in ED Medications  morphine 2 MG/ML injection 2 mg (2 mg Intravenous Given 03/10/20 1343)  sodium chloride 0.9 % bolus 500 mL (0 mLs Intravenous Stopped 03/10/20 1713)  iohexol (OMNIPAQUE) 300 MG/ML solution 100 mL (100 mLs Intravenous Contrast Given 03/10/20 1443)    ED Course  I have reviewed the triage vital signs and the nursing notes.  Pertinent labs & imaging results that were available during my care of the patient were reviewed by me and considered in my medical decision making (see chart for details).  Clinical Course as of 03/10/20 1730  Tue Mar 10, 2020  1715 Dr. Jenetta Downer [BM]    Clinical Course User Index [BM] Gari Crown   MDM  Rules/Calculators/A&P                         Additional history obtained from: 1. Nursing notes from this visit. 2. EMR. ---------------  75 year old female presented for intermittent right side pain rating down to her abdomen.  Pain present for several months but worsening since last night.  She is significantly tender with palpation of the right upper quadrant right lower quadrant and periumbilical area.  Will obtain blood work and CT abdomen pelvis.  Pain medication and IV fluids ordered - I ordered, reviewed and interpreted labs which include: CBC shows mild leukocytosis of 11.1, no anemia. CMP shows no emergent electrolyte derangement, AKI, LFT elevations or gap. Urinalysis without evidence of infection or hemoglobin to suggest kidney stone disease Lipase elevated 92, no priors to compare.  CTAP:  IMPRESSION:  1. No acute findings in the abdomen or pelvis. Specifically, no  findings to explain the patient's history of right lower quadrant  pain.  2. Common bile duct dilatation up to 11 mm diameter. No obstructing  stone or mass lesion evident and no associated dilatation of the  main pancreatic duct. MRCP could be used to further evaluate as  clinically warranted.  3. Left colonic diverticulosis without diverticulitis.  4. Aortic Atherosclerosis (ICD10-I70.0).  - Consult was called to gastroenterology, Dr. Abbey Chatters who recommended right upper quadrant ultrasound and then call back with results to on-call GI as Dr. Abbey Chatters is not on-call today. - Patient was reassessed she is sleeping comfortably after receiving morphine, no acute distress.  She is arousable to voice, her daughter is at bedside.  They say understanding of care plan and are agreeable to ultrasound.  RUQ [place]:  IMPRESSION:  Prominent common bile duct measuring up to 11 mm. No visible ductal  stones although the distal duct is obscured by overlying bowel gas.    Gallbladder wall calcifications along the anterior  gallbladder wall  again noted as seen on prior ultrasound., tail artifact from the  calcifications suggests adenomyomatosis.  - Consult with gastroenterologist Dr. Jenetta Downer, advises his team will see patient after medical admission for MRCP - Patient reassessed, no acute distress vital signs stable patient and her daughter are agreeable for admission.  5:42 PM: Consult with Dr. Lorin Mercy, patient accepted to hospitalist service.   Note: Portions of this report may have been transcribed using voice recognition software. Every effort was made to ensure accuracy;  however, inadvertent computerized transcription errors may still be present. Final Clinical Impression(s) / ED Diagnoses Final diagnoses:  RUQ abdominal pain  Common bile duct dilation    Rx / DC Orders ED Discharge Orders    None       Gari Crown 03/10/20 1742    Fredia Sorrow, MD 03/18/20 [phone]

## 2020-03-10 NOTE — H&P (Signed)
History and Physical    Nohemi Nicklaus RWE:[id number] DOB: 13-Oct-1945 DOA: 03/10/2020  PCP: Leonie Douglas, MD Consultants:  Delton Coombes - oncology; Rourk - GI Patient coming from:  Home - lives with daughter; Donald Prose:  Daughter, [phone]  Chief Complaint:  R flank pain  HPI: Neeya Prigmore is a 75 y.o. female with medical history significant of GIST; HLD; and GERD presenting with R flank pain.  She reports that her side hurt.  Her R side has been bothering her for over a year.  This AM, it caused sharp pains and she had difficulty standing up.  It radiated to her umbilicus.  Nothing makes it worse or better.  Does not change with eating.  No N/V/D.  No chills or fever.  Last BM was yesterday and was normal.  She had GIST in June.  She had 2 tumors at that time.  She had partial gastrectomy.  She is supposed to have 4 scans and 4 blood tests a year.  She is still on chemotherapy (Gleevac).  She is due for colonoscopy on 3/24.  She was last seen by Dr. Delton Coombes for this on 2/24.  Partial gastrectomy was done on 06/19/19 and pathology showed low-grade, very low risk GIST with no + lymph nodes - which means a 1.9% metastatic rate.  She is scheduled for repeat C/A/P with contrast CT.   ED Course: R flank pain for months, worse yesterday.  Nausea without vomiting.  CT with dilated CBD.  GI consulted, recommended US - GB wall calcifications, adenomyomatosis, CBD dilation.  Pain controlled with morphine.  Recommend admission with MRCP and GI consult.  Review of Systems: As per HPI; otherwise review of systems reviewed and negative.   Ambulatory Status:  Ambulates without assistance  COVID Vaccine Status:  Complete plus booster  Past Medical History:  Diagnosis Date  . Achalasia   . Arthritis   . GERD (gastroesophageal reflux disease)   . GIST (gastrointestinal stroma tumor), malignant, colon ([place]) 04/24/2019   s/p resection of 2 tumors in June 2021 at [facility] in [place]   . HLD (hyperlipidemia)     Past Surgical History:  Procedure Laterality Date  . BREAST BIOPSY Right 09/06/2019   [facility]; evaluation of suspicious calcification of right breast; pathology with atypical ductal hyperplasia, focal cystic dilation of ducts with columnar cell change of epithelium, presence of microcalcifications.  Marland Kitchen CATARACT EXTRACTION Right 03/28/2017   [facility]  . CATARACT EXTRACTION Left 03/14/2017   [facility]  . COLONOSCOPY  01/28/2013   Dr. Ok Edwards, [place], [place]; Hyperplastic polyp x2, sessile serrated adenoma x2, tubular adenoma with low-grade dysplasia x1.  Recommend repeat colonoscopy in 3 years.  . ESOPHAGOGASTRODUODENOSCOPY  04/24/2019   [place]; 2.4 cm gastric submucosal mass in the fundus consistent with GIST.  FNA consistent with GIST.  Marland Kitchen ESOPHAGOGASTRODUODENOSCOPY  01/28/2013   Dr. Gwen Her Devers in [place], [place]; normal esophagus, hernia at GE junction, normal examined stomach, normal examined duodenum.  Does not appear dilation was performed.  Marland Kitchen GIST tumor resection  06/19/2019   [facility], robotic assisted laparoscopic partial gastrectomy x2.  Pathology consistent with GIST  . HELLER MYOTOMY  06/19/2019   [facility]; esophageal myotomy with hiatal hernia repair and dor fundoplication  . left hand surgery    . tubal ligation    . UMBILICAL HERNIA REPAIR     1970s  . UPPER ESOPHAGEAL ENDOSCOPIC ULTRASOUND (EUS)  04/24/2019   [facility]; endoscopic  findings: Tortuous esophagus s/p balloon dilation to 18 mm with no effect s/p biopsy, submucosal mass in the fundus, normal duodenum; endosonographic findings: 2.4 cm x 2.4 cm submucosal mass in the fundus s/p FNA, cholelithiasis, no hepatic, CBD, or pancreatic abnormalities.  Pathology: GIST, hepatic parenchyma with mild steato-fibrosis, reflux esophagitis  . YAG laser posterior capsulotomy Left  08/30/2018   [facility], left eye.    Social History   Socioeconomic History  . Marital status: Single    Spouse name: Not on file  . Number of children: Not on file  . Years of education: Not on file  . Highest education level: Not on file  Occupational History  . Occupation: retired  Tobacco Use  . Smoking status: Current Every Day Smoker    Packs/day: 1.00    Years: 47.00    Pack years: 47.00    Types: Cigarettes  . Smokeless tobacco: Never Used  Vaping Use  . Vaping Use: Never used  Substance and Sexual Activity  . Alcohol use: Yes    Comment: glass of wine once or twice a week  . Drug use: Never  . Sexual activity: Not on file  Other Topics Concern  . Not on file  Social History Narrative  . Not on file   Social Determinants of Health   Financial Resource Strain: Not on file  Food Insecurity: Not on file  Transportation Needs: No Transportation Needs  . Lack of Transportation (Medical): No  . Lack of Transportation (Non-Medical): No  Physical Activity: Inactive  . Days of Exercise per Week: 0 days  . Minutes of Exercise per Session: 0 min  Stress: Not on file  Social Connections: Not on file  Intimate Partner Violence: Not At Risk  . Fear of Current or Ex-Partner: No  . Emotionally Abused: No  . Physically Abused: No  . Sexually Abused: No    Allergies  Allergen Reactions  . Tape     Causes Rash  . Indomethacin Hives  . Tolectin [Tolmetin]   . Wound Dressing Adhesive     Adhesive tape   . Penicillins Swelling and Rash    Family History  Problem Relation Age of Onset  . Stomach cancer Mother   . Lung cancer Brother   . Colon cancer Paternal Grandmother        in 18s    Prior to Admission medications   Medication Sig Start Date End Date Taking? Authorizing Provider  acetaminophen (TYLENOL) 500 MG tablet Take 500 mg by mouth as needed.    [provider]  Ascorbic Acid (VITAMIN C) 1000 MG tablet Take 1,000 mg by  mouth daily. Patient not taking: Reported on 03/05/2020    [provider]  calcium carbonate (OSCAL) 1500 (600 Ca) MG TABS tablet 1 tablet with meals    [provider]  Cholecalciferol (D3) 50 MCG (2000 UT) TABS Take 1 tablet by mouth daily.    [provider]  Ibuprofen 200 MG CAPS Take by mouth as needed.    [provider]  imatinib (GLEEVEC) 400 MG tablet Take 400 mg by mouth daily. Take with meals and large glass of water.Caution:Chemotherapy.    [provider]  rosuvastatin (CRESTOR) 10 MG tablet Take 10 mg by mouth daily.    [provider]    Physical Exam: Vitals:   03/10/20 1231 03/10/20 1233 03/10/20 1350 03/10/20 1611  BP:  (!) 143/118 (!) 126/99 130/79  Pulse:  71 68 64  Resp:  20 18 16   Temp:  97.6 F (36.4 C)    TempSrc:  Oral    SpO2:  98% 99% 96%  Weight: 75.4 kg     Height: 4' 10"  (1.473 m)        . General:  Appears calm and comfortable and is in NAD . Eyes:  PERRL, EOMI, normal lids, iris . ENT:  grossly normal hearing, lips & tongue, mmm; edentulous . Neck:  no LAD, masses or thyromegaly . Cardiovascular:  RRR, no m/r/g. No LE edema.  Marland Kitchen Respiratory:   CTA bilaterally with no wheezes/rales/rhonchi.  Normal respiratory effort. . Abdomen:  soft, TTP in RLQ, ND . Back:   normal alignment, R flank TTP with radiation along RLQ . Skin:  no rash or induration seen on limited exam . Musculoskeletal:  grossly normal tone BUE/BLE, good ROM, no bony abnormality . Psychiatric:  grossly normal mood and affect, speech fluent and appropriate, AOx3 . Neurologic:  CN 2-12 grossly intact, moves all extremities in coordinated fashion    Radiological Exams on Admission: Independently reviewed - see discussion in A/P where applicable  CT ABDOMEN PELVIS W CONTRAST  Result Date: 03/10/2020 CLINICAL DATA:  Right lower quadrant pain. EXAM: CT ABDOMEN AND PELVIS WITH CONTRAST TECHNIQUE: Multidetector CT imaging of the  abdomen and pelvis was performed using the standard protocol following bolus administration of intravenous contrast. CONTRAST:  142m OMNIPAQUE IOHEXOL 300 MG/ML  SOLN COMPARISON:  None. FINDINGS: Lower chest: Unremarkable. Hepatobiliary: No suspicious focal abnormality within the liver parenchyma. Despite lack of substantial intrahepatic biliary duct dilatation. The extrahepatic common bile duct in the head of pancreas is dilated up to 11 mm. Pancreas: No focal mass lesion. No dilatation of the main duct. No intraparenchymal cyst. No peripancreatic edema. Spleen: No splenomegaly. No focal mass lesion. Adrenals/Urinary Tract: No adrenal nodule or mass. 4.2 cm cyst noted upper pole left kidney additional scattered tiny hypodensities in the left kidney are too small to characterize but likely benign cysts. No evidence for hydroureter. The urinary bladder appears normal for the degree of distention. Stomach/Bowel: Surgical changes are noted in the region of the proximal stomach. Duodenum is normally positioned as is the ligament of Treitz. No small bowel wall thickening. No small bowel dilatation. The terminal ileum is normal. The appendix is not well visualized, but there is no edema or inflammation in the region of the cecum. No gross colonic mass. No colonic wall thickening. Diverticular changes are noted in the left colon without evidence of diverticulitis. Vascular/Lymphatic: There is abdominal aortic atherosclerosis without aneurysm. There is no gastrohepatic or hepatoduodenal ligament lymphadenopathy. No retroperitoneal or mesenteric lymphadenopathy. No pelvic sidewall lymphadenopathy. Reproductive: The uterus is surgically absent. There is no adnexal mass. Other: No intraperitoneal free fluid. Musculoskeletal: No worrisome lytic or sclerotic osseous abnormality. IMPRESSION: 1. No acute findings in the abdomen or pelvis. Specifically, no findings to explain the patient's history of right lower quadrant pain. 2.  Common bile duct dilatation up to 11 mm diameter. No obstructing stone or mass lesion evident and no associated dilatation of the main pancreatic duct. MRCP could be used to further evaluate as clinically warranted. 3. Left colonic diverticulosis without diverticulitis. 4. Aortic Atherosclerosis (ICD10-I70.0). Electronically Signed   By: EMisty StanleyM.D.   On: 03/10/2020 15:38   [place]domen Limited RUQ (LIVER/GB)  Result Date: 03/10/2020 CLINICAL DATA:  Right upper quadrant pain EXAM: ULTRASOUND ABDOMEN LIMITED RIGHT UPPER QUADRANT COMPARISON:  CT 03/10/2020.  Ultrasound 02/04/2020. FINDINGS: Gallbladder: Gallbladder wall  calcifications again noted along the anterior gallbladder wall with comet tail artifact. Common bile duct: Diameter: Prominent measuring up to 11 mm as seen on earlier CT. No visible ductal stones. The distal duct is obscured by overlying bowel gas. Liver: No focal lesion identified. Within normal limits in parenchymal echogenicity. Portal vein is patent on color Doppler imaging with normal direction of blood flow towards the liver. Other: None. IMPRESSION: Prominent common bile duct measuring up to 11 mm. No visible ductal stones although the distal duct is obscured by overlying bowel gas. 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 Electronically Signed   By: Rolm Baptise M.D.   On: 03/10/2020 16:59    EKG: not done   Labs on Admission: I have personally reviewed the available labs and imaging studies at the time of the admission.  Pertinent labs:   Glucose 104 Lipase 92 WBC 11.1 UA unremarkable   Assessment/Plan Principal Problem:   Acute right flank pain Active Problems:   GIST (gastrointestinal stroma tumor), malignant, colon ([facility])   Common bile duct dilatation   Dyslipidemia   Acute R flank pain -Patient presenting with acute onset of R  flank pain -Labs and imaging unremarkable other than CBD dilatation (see below) -That may be related or may be an incidental finding -If incidental, this pain may be MSK related -Pain is improved with morphine, will continue for now  CBD Dilatation  -Patient with h/o recent GIST tumors s/p resection -Now with possible incidental finding of CBD dilatation to 11 mm -Needs further evaluation -MRCP ordered -Based on results of MRCP, ERCP may be needed with brushings -GI consult is pending  GIST -Patient with 2 tumors that were resected last June with clear margins, nodes negative -Anticipated metastatic rate is 1.9% -She is on Gleevac -There are lots of GI side effects noted with this medication, so it is possible that CBD dilatation is related to this -Will hold Gleevac for now pending further evaluation -Oncology consult may be needed depending on results of MRCP  HLD -Continue Crestor  Obesity Body mass index is 34.74 kg/m.  -Weight loss should be encouraged -Outpatient PCP/bariatric medicine f/u encouraged     Note: This patient has been tested and is negative for the novel coronavirus COVID-19. The patient has been fully vaccinated against COVID-19.   Level of care: Med-Surg DVT prophylaxis: Heparin SQ Code Status:  Full - confirmed with patient/family Family Communication: Daughter was present throughout evaluation Disposition Plan:  The patient is from: home  Anticipated d/c is to: home without [facility] services  Anticipated d/c date will depend on clinical response to treatment, but likely 2-3 days  Patient is currently: acutely ill Consults called: GI Admission status:  Admit - It is my clinical opinion that admission to [place] is reasonable and necessary because of the expectation that this patient will require hospital care that crosses at least 2 midnights to treat this condition based on the medical complexity of the problems presented.  Given the aforementioned  information, the predictability of an adverse outcome is felt to be significant.    Karmen Bongo MD [facility]   How to contact the [facility] Attending or Consulting provider [place] or covering provider during after hours [place], for this patient?  1. Check the care team in [facility] and look for a) attending/consulting [facility] provider listed and b) the [facility] team listed 2. Log into  [email or web address] and use [place]'s universal password to access. If you do not have the password, please contact the hospital operator. 3. Locate the [facility] provider you are looking for under [facility] and page to a number that you can be directly reached. 4. If you still have difficulty reaching the provider, please page the [facility] (Director on Call) for the Hospitalists listed on amion for assistance.   03/10/2020, 7:35 PM

## 2020-03-11 ENCOUNTER — Inpatient Hospital Stay (HOSPITAL_COMMUNITY): Payer: Medicare Other

## 2020-03-11 DIAGNOSIS — K838 Other specified diseases of biliary tract: Principal | ICD-10-CM

## 2020-03-11 DIAGNOSIS — R109 Unspecified abdominal pain: Secondary | ICD-10-CM | POA: Diagnosis not present

## 2020-03-11 DIAGNOSIS — R1011 Right upper quadrant pain: Secondary | ICD-10-CM

## 2020-03-11 DIAGNOSIS — C49A4 Gastrointestinal stromal tumor of large intestine: Secondary | ICD-10-CM | POA: Diagnosis not present

## 2020-03-11 LAB — CBC
HCT: 36.3 % (ref 36.0–46.0)
Hemoglobin: 11.9 g/dL — ABNORMAL LOW (ref 12.0–15.0)
MCH: 32.2 pg (ref 26.0–34.0)
MCHC: 32.8 g/dL (ref 30.0–36.0)
MCV: 98.1 fL (ref 80.0–100.0)
Platelets: 219 10*3/uL (ref 150–400)
RBC: 3.7 MIL/uL — ABNORMAL LOW (ref 3.87–5.11)
RDW: 13.8 % (ref 11.5–15.5)
WBC: 11.8 10*3/uL — ABNORMAL HIGH (ref 4.0–10.5)
nRBC: 0 % (ref 0.0–0.2)

## 2020-03-11 LAB — BASIC METABOLIC PANEL
Anion gap: 8 (ref 5–15)
BUN: 14 mg/dL (ref 8–23)
CO2: 25 mmol/L (ref 22–32)
Calcium: 8.6 mg/dL — ABNORMAL LOW (ref 8.9–10.3)
Chloride: 107 mmol/L (ref 98–111)
Creatinine, Ser: 0.67 mg/dL (ref 0.44–1.00)
GFR, Estimated: >60 mL/min (ref >60)
Glucose, Bld: 105 mg/dL — ABNORMAL HIGH (ref 70–99)
Potassium: 3.3 mmol/L — ABNORMAL LOW (ref 3.5–5.1)
Sodium: 140 mmol/L (ref 135–145)

## 2020-03-11 LAB — GLUCOSE, CAPILLARY
Glucose-Capillary: 90 mg/dL (ref 70–99)
Glucose-Capillary: 90 mg/dL (ref 70–99)
Glucose-Capillary: 82 mg/dL (ref 70–99)

## 2020-03-11 MED ORDER — POTASSIUM CHLORIDE CRYS ER 20 MEQ PO TBCR
40.0000 meq | EXTENDED_RELEASE_TABLET | Freq: Once | ORAL | Status: AC
  Administered 2020-03-11: 40 meq via ORAL
  Filled 2020-03-11: qty 2

## 2020-03-11 MED ORDER — GADOBUTROL 1 MMOL/ML IV SOLN
7.0000 mL | Freq: Once | INTRAVENOUS | Status: AC | PRN
  Administered 2020-03-11: 7 mL via INTRAVENOUS

## 2020-03-11 MED ORDER — GABAPENTIN 100 MG PO CAPS
100.0000 mg | ORAL_CAPSULE | Freq: Two times a day (BID) | ORAL | Status: DC
  Administered 2020-03-11 – 2020-03-12 (×2): 100 mg via ORAL
  Filled 2020-03-11 (×2): qty 1

## 2020-03-11 NOTE — Consult Note (Addendum)
Referring Provider: [facility] Primary Care Physician:  Leonie Douglas, MD Primary Gastroenterologist:  Dr. Gala Romney   Date of Admission: 03/10/20 Date of Consultation: 03/11/20  Reason for Consultation:  CBD dilation   HPI:  Cassandra Fowler is a 75 y.o. year old female with a history of multifocal gastric GIST, undergoing  resection in June 2021 in [place], achalasia, colon polyps in 2015, presenting to the ED yesterday with worsening acute on chronic right flank pain. CT yesterday with contrast showed CBD dilation up to 11 mm, US abdomen limited with gallbladder wall calcifications along anterior gallbladder, suggestion of adenomyomatosis. LFTs normal. Lipase non-specifically elevated at 92. Hgb 11.9 today, down from 14.4. MRCP pending. As of note, colonoscopy/EGD/dilation had been scheduled as outpatient 04/02/20 ([place]).    States she has had at least a year of right flank pain, radiating at times around to front of abdomen, triggered by movement such as getting in the car, lifting her legs, going upstairs. No association with bowel movements. No overt GI bleeding. No N/V. Good appetite. Intermittent dysphagia.   Underwent Heller myotomy last year.   Past Medical History:  Diagnosis Date  . Achalasia   . Arthritis   . GERD (gastroesophageal reflux disease)   . GIST (gastrointestinal stroma tumor), malignant, colon ([place]) 04/24/2019   s/p resection of 2 tumors in June 2021 at [facility] in [place]  . HLD (hyperlipidemia)     Past Surgical History:  Procedure Laterality Date  . BREAST BIOPSY Right 09/06/2019   [facility]; evaluation of suspicious calcification of right breast; pathology with atypical ductal hyperplasia, focal cystic dilation of ducts with columnar cell change of epithelium, presence of microcalcifications.  Marland Kitchen CATARACT EXTRACTION Right 03/28/2017   [facility]  . CATARACT EXTRACTION Left 03/14/2017   [facility]  . COLONOSCOPY  01/28/2013   Dr. Ok Edwards, [place], [place]; Hyperplastic polyp x2, sessile serrated adenoma x2, tubular adenoma with low-grade dysplasia x1.  Recommend repeat colonoscopy in 3 years.  . ESOPHAGOGASTRODUODENOSCOPY  04/24/2019   [place]; 2.4 cm gastric submucosal mass in the fundus consistent with GIST.  FNA consistent with GIST.  Marland Kitchen ESOPHAGOGASTRODUODENOSCOPY  01/28/2013   Dr. Gwen Her Devers in [place], [place]; normal esophagus, hernia at GE junction, normal examined stomach, normal examined duodenum.  Does not appear dilation was performed.  Marland Kitchen GIST tumor resection  06/19/2019   [facility], robotic assisted laparoscopic partial gastrectomy x2.  Pathology consistent with GIST  . HELLER MYOTOMY  06/19/2019   [facility]; esophageal myotomy with hiatal hernia repair and dor fundoplication  . left hand surgery    . tubal ligation    . UMBILICAL HERNIA REPAIR     1970s  . UPPER ESOPHAGEAL ENDOSCOPIC ULTRASOUND (EUS)  04/24/2019   [facility]; endoscopic findings: Tortuous esophagus s/p balloon dilation to 18 mm with no effect s/p biopsy, submucosal mass in the fundus, normal duodenum; endosonographic findings: 2.4 cm x 2.4 cm submucosal mass in the fundus s/p FNA, cholelithiasis, no hepatic, CBD, or pancreatic abnormalities.  Pathology: GIST, hepatic parenchyma with mild steato-fibrosis, reflux esophagitis  . YAG laser posterior capsulotomy Left 08/30/2018   [facility], left eye.    Prior to Admission medications   Medication Sig Start Date End Date Taking? Authorizing Provider  acetaminophen (TYLENOL) 500 MG tablet Take 500 mg by mouth as needed.   Yes [provider]  Ascorbic Acid (VITAMIN C) 100 MG tablet Take 100 mg by mouth daily.  Yes [provider]  calcium carbonate (OSCAL) 1500 (600 Ca) MG TABS tablet Take 1,500 mg by mouth daily with breakfast.   Yes [provider]  Cholecalciferol (D3) 50 MCG (2000 UT) TABS Take 1 tablet by mouth daily.   Yes [provider]  Ibuprofen 200 MG CAPS Take by mouth as needed.   Yes [provider]  imatinib (GLEEVEC) 400 MG tablet Take 400 mg by mouth daily. Take with meals and large glass of water.Caution:Chemotherapy.   Yes [provider]  rosuvastatin (CRESTOR) 10 MG tablet Take 10 mg by mouth daily.   Yes [provider]    Current Facility-Administered Medications  Medication Dose Route Frequency Provider Last Rate Last Admin  . acetaminophen (TYLENOL) tablet 650 mg  650 mg Oral Q6H PRN Karmen Bongo, MD       Or  . acetaminophen (TYLENOL) suppository 650 mg  650 mg Rectal Q6H PRN Karmen Bongo, MD      . bisacodyl (DULCOLAX) EC tablet 5 mg  5 mg Oral Daily PRN Karmen Bongo, MD      . docusate sodium (COLACE) capsule 100 mg  100 mg Oral BID Karmen Bongo, MD   100 mg at 03/11/20 9528  . heparin injection 5,000 Units  5,000 Units Subcutaneous Lynne Logan, MD   5,000 Units at 03/11/20 4132  . hydrALAZINE (APRESOLINE) injection 5 mg  5 mg Intravenous Q4H PRN Karmen Bongo, MD      . HYDROcodone-acetaminophen (NORCO/VICODIN) 5-325 MG per tablet 1-2 tablet  1-2 tablet Oral Q4H PRN Karmen Bongo, MD      . lactated ringers infusion   Intravenous Continuous Karmen Bongo, MD 75 mL/hr at 03/11/20 0700 Infusion Verify at 03/11/20 0700  . morphine 2 MG/ML injection 2 mg  2 mg Intravenous Q2H PRN Karmen Bongo, MD      . ondansetron [facility]) tablet 4 mg  4 mg Oral Q6H PRN Karmen Bongo, MD       Or  . ondansetron [facility]) injection 4 mg  4 mg Intravenous Q6H PRN Karmen Bongo, MD      . polyethylene glycol (MIRALAX / GLYCOLAX) packet 17 g  17 g Oral Daily PRN Karmen Bongo, MD      . rosuvastatin (CRESTOR) tablet 10 mg  10 mg Oral Daily Karmen Bongo, MD   10 mg at 03/11/20 0955    Allergies as of 03/10/2020 - Review Complete 03/10/2020   Allergen Reaction Noted  . Tape  03/05/2020  . Indomethacin Hives 01/17/2020  . Tolectin [tolmetin]  01/17/2020  . Wound dressing adhesive  01/17/2020  . Penicillins Swelling and Rash 01/17/2020    Family History  Problem Relation Age of Onset  . Stomach cancer Mother   . Lung cancer Brother   . Colon cancer Paternal Grandmother        in 78s    Social History   Socioeconomic History  . Marital status: Single    Spouse name: Not on file  . Number of children: Not on file  . Years of education: Not on file  . Highest education level: Not on file  Occupational History  . Occupation: retired  Tobacco Use  . Smoking status: Current Every Day Smoker    Packs/day: 1.00    Years: 47.00    Pack years: 47.00    Types: Cigarettes  . Smokeless tobacco: Never Used  Vaping Use  . Vaping Use: Never used  Substance and Sexual Activity  . Alcohol use:  Yes    Comment: glass of wine once or twice a week  . Drug use: Never  . Sexual activity: Not on file  Other Topics Concern  . Not on file  Social History Narrative  . Not on file   Social Determinants of Health   Financial Resource Strain: Not on file  Food Insecurity: Not on file  Transportation Needs: No Transportation Needs  . Lack of Transportation (Medical): No  . Lack of Transportation (Non-Medical): No  Physical Activity: Inactive  . Days of Exercise per Week: 0 days  . Minutes of Exercise per Session: 0 min  Stress: Not on file  Social Connections: Not on file  Intimate Partner Violence: Not At Risk  . Fear of Current or Ex-Partner: No  . Emotionally Abused: No  . Physically Abused: No  . Sexually Abused: No    Review of Systems: Gen: Denies fever, chills, loss of appetite, change in weight or weight loss CV: Denies chest pain, heart palpitations, syncope, edema  Resp: Denies shortness of breath with rest, cough, wheezing GI: see HPI GU : Denies urinary burning, urinary frequency, urinary incontinence.   MS: see HPI Derm: Denies rash, itching, dry skin Psych: Denies depression, anxiety,confusion, or memory loss Heme: Denies bruising, bleeding, and enlarged lymph nodes.  Physical Exam: Vital signs in last 24 hours: Temp:  [97.6 F (36.4 C)-98.3 F (36.8 C)] 98.3 F (36.8 C) (03/02 0802) Pulse Rate:  [59-76] 64 (03/02 0802) Resp:  [16-20] 20 (03/02 0802) BP: (98-145)/(64-118) 117/67 (03/02 0802) SpO2:  [93 %-99 %] 93 % (03/02 0802) FiO2 (%):  [21 %] 21 % (03/01 1926) Weight:  [75.4 kg] 75.4 kg (03/02 0119) Last BM Date: 03/09/20 General:   Alert,  Well-developed, well-nourished, pleasant and cooperative in NAD Head:  Normocephalic and atraumatic. Eyes:  Sclera clear, no icterus.   Conjunctiva pink. Ears:  Normal auditory acuity. Nose:  No deformity, discharge,  or lesions. Mouth:  No deformity or lesions, dentition normal. Lungs:  Scattered rhonchi Heart:  S1 S2 present Abdomen:  Soft, +BS, point TTP RLQ specifically over right hip, no rebound or guarding Rectal:  Deferred  Msk:  Symmetrical without gross deformities. Normal posture. Extremities:  Without  edema. Neurologic:  Alert and  oriented x4 Psych:  Alert and cooperative. Normal mood and affect.  Intake/Output from previous day: 03/01 0701 - 03/02 0700 In: 1580 [P.O.:480; I.V.:600; IV Piggyback:500] Out: -  Intake/Output this shift: No intake/output data recorded.  Lab Results: Recent Labs    03/10/20 1328 03/11/20 0546  WBC 11.1* 11.8*  HGB 14.4 11.9*  HCT 43.4 36.3  PLT 262 219   BMET Recent Labs    03/10/20 1328 03/11/20 0546  NA 141 140  K 4.0 3.3*  CL 105 107  CO2 27 25  GLUCOSE 104* 105*  BUN 11 14  CREATININE 0.76 0.67  CALCIUM 9.5 8.6*   LFT Recent Labs    03/10/20 1328  PROT 7.4  ALBUMIN 4.5  AST 17  ALT 15  ALKPHOS 57  BILITOT 1.0   Studies/Results: CT ABDOMEN PELVIS W CONTRAST  Result Date: 03/10/2020 CLINICAL DATA:  Right lower quadrant pain. EXAM: CT ABDOMEN AND PELVIS  WITH CONTRAST TECHNIQUE: Multidetector CT imaging of the abdomen and pelvis was performed using the standard protocol following bolus administration of intravenous contrast. CONTRAST:  174m OMNIPAQUE IOHEXOL 300 MG/ML  SOLN COMPARISON:  None. FINDINGS: Lower chest: Unremarkable. Hepatobiliary: No suspicious focal abnormality within the liver parenchyma. Despite lack of  substantial intrahepatic biliary duct dilatation. The extrahepatic common bile duct in the head of pancreas is dilated up to 11 mm. Pancreas: No focal mass lesion. No dilatation of the main duct. No intraparenchymal cyst. No peripancreatic edema. Spleen: No splenomegaly. No focal mass lesion. Adrenals/Urinary Tract: No adrenal nodule or mass. 4.2 cm cyst noted upper pole left kidney additional scattered tiny hypodensities in the left kidney are too small to characterize but likely benign cysts. No evidence for hydroureter. The urinary bladder appears normal for the degree of distention. Stomach/Bowel: Surgical changes are noted in the region of the proximal stomach. Duodenum is normally positioned as is the ligament of Treitz. No small bowel wall thickening. No small bowel dilatation. The terminal ileum is normal. The appendix is not well visualized, but there is no edema or inflammation in the region of the cecum. No gross colonic mass. No colonic wall thickening. Diverticular changes are noted in the left colon without evidence of diverticulitis. Vascular/Lymphatic: There is abdominal aortic atherosclerosis without aneurysm. There is no gastrohepatic or hepatoduodenal ligament lymphadenopathy. No retroperitoneal or mesenteric lymphadenopathy. No pelvic sidewall lymphadenopathy. Reproductive: The uterus is surgically absent. There is no adnexal mass. Other: No intraperitoneal free fluid. Musculoskeletal: No worrisome lytic or sclerotic osseous abnormality. IMPRESSION: 1. No acute findings in the abdomen or pelvis. Specifically, no findings to  explain the patient's history of right lower quadrant pain. 2. Common bile duct dilatation up to 11 mm diameter. No obstructing stone or mass lesion evident and no associated dilatation of the main pancreatic duct. MRCP could be used to further evaluate as clinically warranted. 3. Left colonic diverticulosis without diverticulitis. 4. Aortic Atherosclerosis (ICD10-I70.0). Electronically Signed   By: Misty Stanley M.D.   On: 03/10/2020 15:38   US Abdomen Limited RUQ (LIVER/GB)  Result Date: 03/10/2020 CLINICAL DATA:  Right upper quadrant pain EXAM: ULTRASOUND ABDOMEN LIMITED RIGHT UPPER QUADRANT COMPARISON:  CT 03/10/2020.  Ultrasound 02/04/2020. FINDINGS: Gallbladder: Gallbladder wall calcifications again noted along the anterior gallbladder wall with comet tail artifact. Common bile duct: Diameter: Prominent measuring up to 11 mm as seen on earlier CT. No visible ductal stones. The distal duct is obscured by overlying bowel gas. Liver: No focal lesion identified. Within normal limits in parenchymal echogenicity. Portal vein is patent on color Doppler imaging with normal direction of blood flow towards the liver. Other: None. IMPRESSION: Prominent common bile duct measuring up to 11 mm. No visible ductal stones although the distal duct is obscured by overlying bowel gas. 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 Electronically Signed   By: Rolm Baptise M.D.   On: 03/10/2020 16:59    Impression: Very pleasant 75 year old female presenting with at least a year long history of right flank/RLQ pain worsening, precipitated by movement/exertion but no other associated precipitating or relieving factors, with incidental finding of CBD dilation up to 11 mm diameter. MRCP ordered for today and final read pending. LFTs normal. Lipase nonspecifically mild elevation, not indicative of  pancreatitis.   Prior GI history significant for multifocal GIST s/p gastric resection June 2021 in [place], achalasia, colon polyps in 2015 and surveillance due, prior Heller myotomy last year, and plans for colonoscopy/EGD/dilation as outpatient upcoming. Dysphagia at baseline and noted intermittently. Keep plans for outpatient procedures as planned.  Query musculoskeletal etiology but awaiting MRI/MRCP final report.   Normocytic anemia: Hgb 14.4 on admission, down to 11.9 now. No overt GI  bleeding. Follow serially.    Plan: Follow-up on MRI/MRCP Keep outpatient colonoscopy/EGD/dilaton for 3/24 [place] with suggestion of adenomyomatosis: serial follow-up recommended Further recommendations to follow   Annitta Needs, PhD, ANP-BC [facility]     LOS: 1 day    03/11/2020, 10:10 AM

## 2020-03-11 NOTE — Progress Notes (Signed)
PROGRESS NOTE    Cassandra Fowler  WKG:[id number] DOB: 1946-01-05 DOA: 03/10/2020 PCP: Leonie Douglas, MD    Brief Narrative:  Cassandra Fowler is a 75 y.o. female with medical history significant of GIST; HLD; and GERD presenting with R flank pain.  She reports that her side hurt.  Her R side has been bothering her for over a year.  This AM, it caused sharp pains and she had difficulty standing up.  It radiated to her umbilicus.  Nothing makes it worse or better.  Does not change with eating.  No N/V/D.  No chills or fever.  Last BM was yesterday and was normal.  She had GIST in June.  She had 2 tumors at that time.  She had partial gastrectomy.  She is supposed to have 4 scans and 4 blood tests a year.  She is still on chemotherapy (Gleevac).  She is due for colonoscopy on 3/24.  She was last seen by Dr. Delton Coombes for this on 2/24.  Partial gastrectomy was done on 06/19/19 and pathology showed low-grade, very low risk GIST with no + lymph nodes - which means a 1.9% metastatic rate.  She is scheduled for repeat C/A/P with contrast CT.   Assessment & Plan:   Principal Problem:   Acute right flank pain Active Problems:   GIST (gastrointestinal stroma tumor), malignant, colon (HCC)   Common bile duct dilatation   Dyslipidemia   Right flank pain -Unclear etiology at this time -Does not appear to be an obvious infectious process -MRI of the abdomen unrevealing -Concerns for underlying neuropathic pain -Check MRI of thoracic and lumbar spine  CBD dilatation -Appreciate GI input -Transaminases are not elevated -She will need repeat LFTs in the next 2 to 3 months for surveillance  Prior history of GIST -Status post resection of 2 tumors in the past -Currently on Gleevec -Continue to follow-up with oncology  Hyperlipidemia -Continue statin   DVT prophylaxis: heparin injection 5,000 Units Start: 03/10/20 2200  Code Status: Full code Family Communication: Discussed with  daughter at the bedside Disposition Plan: Status is: Inpatient  Remains inpatient appropriate because:Ongoing active pain requiring inpatient pain management   Dispo: The patient is from: Home              Anticipated d/c is to: Home              Patient currently is not medically stable to d/c.   Difficult to place patient No         Consultants:   Gastroenterology  Procedures:     Antimicrobials:       Subjective: Continues to have pain in her right side, right right lower quadrant.  Describes as a sharp shooting pain, worse with motion  Objective: Vitals:   03/11/20 0430 03/11/20 0802 03/11/20 1411 03/11/20 2013  BP: 98/64 117/67 94/76 (!) 144/71  Pulse: 76 64 75 64  Resp:  20  18  Temp: 97.9 F (36.6 C) 98.3 F (36.8 C) 98.4 F (36.9 C) 98.4 F (36.9 C)  TempSrc: Oral Oral Oral Oral  SpO2: 94% 93% 96% 98%  Weight:      Height:        Intake/Output Summary (Last 24 hours) at 03/11/2020 2131 Last data filed at 03/11/2020 1700 Gross per 24 hour  Intake 1560 ml  Output -  Net 1560 ml   Filed Weights   03/10/20 1231 03/11/20 0119  Weight: 75.4 kg 75.4 kg    Examination:  General  exam: Appears calm and comfortable  Respiratory system: Clear to auscultation. Respiratory effort normal. Cardiovascular system: S1 & S2 heard, RRR. No JVD, murmurs, rubs, gallops or clicks. No pedal edema. Gastrointestinal system: Abdomen is nondistended, soft and tender in right flank and rlq. No organomegaly or masses felt. Normal bowel sounds heard. Central nervous system: Alert and oriented. No focal neurological deficits. Extremities: Symmetric 5 x 5 power. Skin: No rashes, lesions or ulcers Psychiatry: Judgement and insight appear normal. Mood & affect appropriate.     Data Reviewed: I have personally reviewed following labs and imaging studies  CBC: Recent Labs  Lab 03/10/20 1328 03/11/20 0546  WBC 11.1* 11.8*  NEUTROABS 8.2*  --   HGB 14.4 11.9*  HCT  43.4 36.3  MCV 97.5 98.1  PLT 262 182   Basic Metabolic Panel: Recent Labs  Lab 03/10/20 1328 03/11/20 0546  NA 141 140  K 4.0 3.3*  CL 105 107  CO2 27 25  GLUCOSE 104* 105*  BUN 11 14  CREATININE 0.76 0.67  CALCIUM 9.5 8.6*   GFR: Estimated Creatinine Clearance: 54.1 mL/min (by C-G formula based on SCr of 0.67 mg/dL). Liver Function Tests: Recent Labs  Lab 03/10/20 1328  AST 17  ALT 15  ALKPHOS 57  BILITOT 1.0  PROT 7.4  ALBUMIN 4.5   Recent Labs  Lab 03/10/20 1328  LIPASE 92*   No results for input(s): AMMONIA in the last 168 hours. Coagulation Profile: No results for input(s): INR, PROTIME in the last 168 hours. Cardiac Enzymes: No results for input(s): CKTOTAL, CKMB, CKMBINDEX, TROPONINI in the last 168 hours. BNP (last 3 results) No results for input(s): PROBNP in the last 8760 hours. HbA1C: No results for input(s): HGBA1C in the last 72 hours. CBG: Recent Labs  Lab 03/11/20 0733 03/11/20 1116 03/11/20 1601  GLUCAP 90 90 82   Lipid Profile: No results for input(s): CHOL, HDL, LDLCALC, TRIG, CHOLHDL, LDLDIRECT in the last 72 hours. Thyroid Function Tests: No results for input(s): TSH, T4TOTAL, FREET4, T3FREE, THYROIDAB in the last 72 hours. Anemia Panel: No results for input(s): VITAMINB12, FOLATE, FERRITIN, TIBC, IRON, RETICCTPCT in the last 72 hours. Sepsis Labs: No results for input(s): PROCALCITON, LATICACIDVEN in the last 168 hours.  Recent Results (from the past 240 hour(s))  Resp Panel by RT-PCR (Flu A&B, Covid) Nasopharyngeal Swab     Status: None   Collection Time: 03/10/20  6:07 PM   Specimen: Nasopharyngeal Swab; Nasopharyngeal(NP) swabs in vial transport medium  Result Value Ref Range Status   SARS Coronavirus 2 by RT PCR NEGATIVE NEGATIVE Final    Comment: (NOTE) SARS-CoV-2 target nucleic acids are NOT DETECTED.  The SARS-CoV-2 RNA is generally detectable in upper respiratory specimens during the acute phase of infection. The  lowest concentration of SARS-CoV-2 viral copies this assay can detect is 138 copies/mL. A negative result does not preclude SARS-Cov-2 infection and should not be used as the sole basis for treatment or other patient management decisions. A negative result may occur with  improper specimen collection/handling, submission of specimen other than nasopharyngeal swab, presence of viral mutation(s) within the areas targeted by this assay, and inadequate number of viral copies(<138 copies/mL). A negative result must be combined with clinical observations, patient history, and epidemiological information. The expected result is Negative.  Fact Sheet for Patients:  EntrepreneurPulse.com.au  Fact Sheet for Healthcare Providers:  IncredibleEmployment.be  This test is no t yet approved or cleared by the [place] FDA and  has been  authorized for detection and/or diagnosis of SARS-CoV-2 by FDA under an Emergency Use Authorization (EUA). This EUA will remain  in effect (meaning this test can be used) for the duration of the COVID-19 declaration under Section 564(b)(1) of the Act, 21 U.S.C.section 360bbb-3(b)(1), unless the authorization is terminated  or revoked sooner.       Influenza A by PCR NEGATIVE NEGATIVE Final   Influenza B by PCR NEGATIVE NEGATIVE Final    Comment: (NOTE) The Xpert Xpress SARS-CoV-2/FLU/RSV plus assay is intended as an aid in the diagnosis of influenza from Nasopharyngeal swab specimens and should not be used as a sole basis for treatment. Nasal washings and aspirates are unacceptable for Xpert Xpress SARS-CoV-2/FLU/RSV testing.  Fact Sheet for Patients: EntrepreneurPulse.com.au  Fact Sheet for Healthcare Providers: IncredibleEmployment.be  This test is not yet approved or cleared by the [place] FDA and has been authorized for detection and/or diagnosis of SARS-CoV-2 by FDA under  an Emergency Use Authorization (EUA). This EUA will remain in effect (meaning this test can be used) for the duration of the COVID-19 declaration under Section 564(b)(1) of the Act, 21 U.S.C. section 360bbb-3(b)(1), unless the authorization is terminated or revoked.  Performed at [facility], [address], [place], [place] [address]          Radiology Studies: CT ABDOMEN PELVIS W CONTRAST  Result Date: 03/10/2020 CLINICAL DATA:  Right lower quadrant pain. EXAM: CT ABDOMEN AND PELVIS WITH CONTRAST TECHNIQUE: Multidetector CT imaging of the abdomen and pelvis was performed using the standard protocol following bolus administration of intravenous contrast. CONTRAST:  118m OMNIPAQUE IOHEXOL 300 MG/ML  SOLN COMPARISON:  None. FINDINGS: Lower chest: Unremarkable. Hepatobiliary: No suspicious focal abnormality within the liver parenchyma. Despite lack of substantial intrahepatic biliary duct dilatation. The extrahepatic common bile duct in the head of pancreas is dilated up to 11 mm. Pancreas: No focal mass lesion. No dilatation of the main duct. No intraparenchymal cyst. No peripancreatic edema. Spleen: No splenomegaly. No focal mass lesion. Adrenals/Urinary Tract: No adrenal nodule or mass. 4.2 cm cyst noted upper pole left kidney additional scattered tiny hypodensities in the left kidney are too small to characterize but likely benign cysts. No evidence for hydroureter. The urinary bladder appears normal for the degree of distention. Stomach/Bowel: Surgical changes are noted in the region of the proximal stomach. Duodenum is normally positioned as is the ligament of Treitz. No small bowel wall thickening. No small bowel dilatation. The terminal ileum is normal. The appendix is not well visualized, but there is no edema or inflammation in the region of the cecum. No gross colonic mass. No colonic wall thickening. Diverticular changes are noted in the left colon without evidence of diverticulitis.  Vascular/Lymphatic: There is abdominal aortic atherosclerosis without aneurysm. There is no gastrohepatic or hepatoduodenal ligament lymphadenopathy. No retroperitoneal or mesenteric lymphadenopathy. No pelvic sidewall lymphadenopathy. Reproductive: The uterus is surgically absent. There is no adnexal mass. Other: No intraperitoneal free fluid. Musculoskeletal: No worrisome lytic or sclerotic osseous abnormality. IMPRESSION: 1. No acute findings in the abdomen or pelvis. Specifically, no findings to explain the patient's history of right lower quadrant pain. 2. Common bile duct dilatation up to 11 mm diameter. No obstructing stone or mass lesion evident and no associated dilatation of the main pancreatic duct. MRCP could be used to further evaluate as clinically warranted. 3. Left colonic diverticulosis without diverticulitis. 4. Aortic Atherosclerosis (ICD10-I70.0). Electronically Signed   By: EMisty StanleyM.D.   On: 03/10/2020 15:38  MR 3D Recon At Scanner  Result Date: 03/11/2020 CLINICAL DATA:  Right upper quadrant abdominal pain. EXAM: MRI ABDOMEN WITHOUT AND WITH CONTRAST (INCLUDING MRCP) TECHNIQUE: Multiplanar multisequence MR imaging of the abdomen was performed both before and after the administration of intravenous contrast. Heavily T2-weighted images of the biliary and pancreatic ducts were obtained, and three-dimensional MRCP images were rendered by post processing. CONTRAST:  18m GADAVIST GADOBUTROL 1 MMOL/ML IV SOLN COMPARISON:  Multiple exams, including ultrasound and CT examinations from 03/10/2020 FINDINGS: Lower chest: Unremarkable Hepatobiliary: No significant abnormal enhancing liver lesion. Somewhat low position of the cystic duct attachment to the common hepatic duct. The common hepatic duct is up to 1.1 cm in diameter, with the common bile duct up to 0.9 cm in diameter tapering distally in a conical fashion without obvious filling defect. No abnormal wall enhancement in the biliary tree.  There is only borderline prominence of the intrahepatic biliary tree. The region of adenomyosis along the gallbladder is poorly seen on today's MRI. Pancreas:  Unremarkable Spleen:  Unremarkable Adrenals/Urinary Tract: Benign-appearing left renal cysts. Adrenal glands unremarkable. Stomach/Bowel: Descending and sigmoid colon diverticulosis. Vascular/Lymphatic:  Aortoiliac atherosclerotic vascular disease. Other:  No supplemental non-categorized findings. Musculoskeletal: Lumbar spondylosis and degenerative disc disease. IMPRESSION: 1. There is mild extrahepatic biliary dilatation with borderline prominence of the intrahepatic biliary tree. No filling defect in the common bile duct to suggest choledocholithiasis. 2. The region of adenomyosis along the gallbladder is poorly seen on today's MRI. 3. Lumbar spondylosis and degenerative disc disease. 4. Descending and sigmoid colon diverticulosis. 5. Benign-appearing left renal cysts. Electronically Signed   By: WVan ClinesM.D.   On: 03/11/2020 10:42   MR ABDOMEN MRCP W WO CONTAST  Result Date: 03/11/2020 CLINICAL DATA:  Right upper quadrant abdominal pain. EXAM: MRI ABDOMEN WITHOUT AND WITH CONTRAST (INCLUDING MRCP) TECHNIQUE: Multiplanar multisequence MR imaging of the abdomen was performed both before and after the administration of intravenous contrast. Heavily T2-weighted images of the biliary and pancreatic ducts were obtained, and three-dimensional MRCP images were rendered by post processing. CONTRAST:  762mGADAVIST GADOBUTROL 1 MMOL/ML IV SOLN COMPARISON:  Multiple exams, including ultrasound and CT examinations from 03/10/2020 FINDINGS: Lower chest: Unremarkable Hepatobiliary: No significant abnormal enhancing liver lesion. Somewhat low position of the cystic duct attachment to the common hepatic duct. The common hepatic duct is up to 1.1 cm in diameter, with the common bile duct up to 0.9 cm in diameter tapering distally in a conical fashion without  obvious filling defect. No abnormal wall enhancement in the biliary tree. There is only borderline prominence of the intrahepatic biliary tree. The region of adenomyosis along the gallbladder is poorly seen on today's MRI. Pancreas:  Unremarkable Spleen:  Unremarkable Adrenals/Urinary Tract: Benign-appearing left renal cysts. Adrenal glands unremarkable. Stomach/Bowel: Descending and sigmoid colon diverticulosis. Vascular/Lymphatic:  Aortoiliac atherosclerotic vascular disease. Other:  No supplemental non-categorized findings. Musculoskeletal: Lumbar spondylosis and degenerative disc disease. IMPRESSION: 1. There is mild extrahepatic biliary dilatation with borderline prominence of the intrahepatic biliary tree. No filling defect in the common bile duct to suggest choledocholithiasis. 2. The region of adenomyosis along the gallbladder is poorly seen on today's MRI. 3. Lumbar spondylosis and degenerative disc disease. 4. Descending and sigmoid colon diverticulosis. 5. Benign-appearing left renal cysts. Electronically Signed   By: WaVan Clines.D.   On: 03/11/2020 10:42   USKoreabdomen Limited RUQ (LIVER/GB)  Result Date: 03/10/2020 CLINICAL DATA:  Right upper quadrant pain EXAM: ULTRASOUND ABDOMEN LIMITED RIGHT UPPER QUADRANT  COMPARISON:  CT 03/10/2020.  Ultrasound 02/04/2020. FINDINGS: Gallbladder: Gallbladder wall calcifications again noted along the anterior gallbladder wall with comet tail artifact. Common bile duct: Diameter: Prominent measuring up to 11 mm as seen on earlier CT. No visible ductal stones. The distal duct is obscured by overlying bowel gas. Liver: No focal lesion identified. Within normal limits in parenchymal echogenicity. Portal vein is patent on color Doppler imaging with normal direction of blood flow towards the liver. Other: None. IMPRESSION: Prominent common bile duct measuring up to 11 mm. No visible ductal stones although the distal duct is obscured by overlying bowel gas.  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 Electronically Signed   By: Rolm Baptise M.D.   On: 03/10/2020 16:59        Scheduled Meds: . docusate sodium  100 mg Oral BID  . gabapentin  100 mg Oral BID  . heparin  5,000 Units Subcutaneous Q8H  . potassium chloride  40 mEq Oral Once  . rosuvastatin  10 mg Oral Daily   Continuous Infusions: . lactated ringers 75 mL/hr at 03/11/20 0700     LOS: 1 day    Time spent: 24mns    JKathie Dike MD [facility]   If 7PM-7AM, please contact night-coverage [email or web address]  03/11/2020, 9:31 PM

## 2020-03-11 NOTE — Progress Notes (Signed)
Initial Nutrition Assessment  DOCUMENTATION CODES:   Obesity unspecified  INTERVENTION:  Follow diet advancement and make further recommendations   NUTRITION DIAGNOSIS:   Inadequate oral intake related to dysphagia as evidenced by NPO status.  GOAL:   Patient will meet greater than or equal to 90% of their needs  MONITOR:   Diet advancement,Labs,Weight trends,PO intake  REASON FOR ASSESSMENT:   Consult     ASSESSMENT: Patient is a 75 yo female with a history of GIST and is s/p gastric resection 6/21. Patient is hard of hearing-bilaterally. She presents with right flank pain and nausea. GI consulted and MRCP pending.  Currently NPO. Complains that sometimes food gets stuck in her throat. Avoids spicy foods and takes a daily vitamin D+ calcium and C supplement. Drinks slim fast to support nutrition intake.   Medications reviewed and include: colace, crestor.  IV lactated ringers @ 75 ml/hr  Current wt 75.4 kg. Patient reports weight in June at ~200 lb  Labs: BMP Latest Ref Rng & Units 03/11/2020 03/10/2020 02/04/2020  Glucose 70 - 99 mg/dL 105(H) 104(H) 103(H)  BUN 8 - 23 mg/dL 14 11 11   Creatinine 0.44 - 1.00 mg/dL 0.67 0.76 0.88  Sodium 135 - 145 mmol/L 140 141 140  Potassium 3.5 - 5.1 mmol/L 3.3(L) 4.0 3.9  Chloride 98 - 111 mmol/L 107 105 105  CO2 22 - 32 mmol/L 25 27 27   Calcium 8.9 - 10.3 mg/dL 8.6(L) 9.5 9.2     Diet Order:   Diet Order            Diet NPO time specified  Diet effective midnight                 EDUCATION NEEDS:  Education needs have been addressed  Skin:  Skin Assessment: Reviewed RN Assessment  Last BM:  2/28  Height:   Ht Readings from Last 1 Encounters:  03/11/20 4' 10"  (1.473 m)    Weight:   Wt Readings from Last 1 Encounters:  03/11/20 75.4 kg    Ideal Body Weight:   45 kg  BMI:  Body mass index is 34.74 kg/m.  Estimated Nutritional Needs:   Kcal:  1400-1600  Protein:  70-80 gr  Fluid:  >1400 ml  daily   Colman Cater MS,RD,CSG,LDN Pager: Shea Evans

## 2020-03-11 NOTE — Progress Notes (Signed)
Referral from Nursing today regarding assisting patient in creating her Living Will and HCPOA. Made visit to her and her daughter Cassandra Fowler and had a long conversation about her life, relationships, spiritual heritage, illness story and EOL wishes. She did fill out the majority of the paperwork today but wanted to think more about adding a second person to her document before completing it. PC gave [name] information on how to have document notarized and witnessed outside of the hospital due to her possible discharge in the next 24 hours. Chaplain provided prayer bedside and spiritual support. Will remain available as needed or requested in order to provide spiritual support and to assess for spiritual need.

## 2020-03-12 ENCOUNTER — Encounter: Payer: Self-pay | Admitting: Internal Medicine

## 2020-03-12 ENCOUNTER — Inpatient Hospital Stay (HOSPITAL_COMMUNITY): Payer: Medicare Other

## 2020-03-12 ENCOUNTER — Telehealth: Payer: Self-pay | Admitting: Gastroenterology

## 2020-03-12 DIAGNOSIS — E785 Hyperlipidemia, unspecified: Secondary | ICD-10-CM | POA: Diagnosis not present

## 2020-03-12 DIAGNOSIS — R109 Unspecified abdominal pain: Secondary | ICD-10-CM | POA: Diagnosis not present

## 2020-03-12 DIAGNOSIS — C49A4 Gastrointestinal stromal tumor of large intestine: Secondary | ICD-10-CM | POA: Diagnosis not present

## 2020-03-12 DIAGNOSIS — K838 Other specified diseases of biliary tract: Secondary | ICD-10-CM | POA: Diagnosis not present

## 2020-03-12 MED ORDER — METHYLPREDNISOLONE SODIUM SUCC 125 MG IJ SOLR
60.0000 mg | Freq: Every day | INTRAMUSCULAR | Status: DC
  Administered 2020-03-12 – 2020-03-13 (×2): 60 mg via INTRAVENOUS
  Filled 2020-03-12 (×2): qty 2

## 2020-03-12 MED ORDER — BACLOFEN 10 MG PO TABS
5.0000 mg | ORAL_TABLET | Freq: Two times a day (BID) | ORAL | Status: DC
  Administered 2020-03-12 – 2020-03-13 (×2): 5 mg via ORAL
  Filled 2020-03-12 (×2): qty 1

## 2020-03-12 MED ORDER — PREGABALIN 25 MG PO CAPS: 25.0000 mg | ORAL_CAPSULE | Freq: Two times a day (BID) | ORAL | Status: DC

## 2020-03-12 NOTE — Telephone Encounter (Signed)
Needs hospital follow up with Cyril Mourning or Vicente Males in 2 months (recheck LFTs at that time).

## 2020-03-12 NOTE — Progress Notes (Addendum)
PT Cancellation Note  Patient Details Name: Cassandra Fowler MRN: [id number] DOB: [birth year]   Cancelled Treatment:    Reason Eval/Treat Not Completed: PT screened, no needs identified, will sign off. Patient on commode in bathroom when PT arrived stating she walked into bathroom by herself with IV pole. Stated she had been going to the bathroom by herself for 2 days now. Patient managed hygiene including washing her hands without upper extremity support, independently. Patient able to get into bed and arrange bedding in standing position independently. PT signing off. Thank you for the referral.  Floria Raveling. Hartnett-Rands, MS, PT Per [facility] #[id number] 03/12/2020, 12:38 PM

## 2020-03-12 NOTE — TOC Initial Note (Signed)
Transition of Care [facility]) - Initial/Assessment Note   Patient Details  Name: Cassandra Fowler MRN: [id number] Date of Birth: 03-28-1945  [facility] CM/SW Contact:    Sherie Don, LCSW Phone Number: 03/12/2020, 4:15 PM  Clinical Narrative: Patient is a 75 year old female who was admitted for acute right flank pain. TOC notified patient's daughter, Concepcion Elk, had questions about help at home for the patient. CSW spoke with patient's daughter. CSW explained PT did not recommend HH or OPPT services, so the family would need to consider private pay for [facility] or a sitter while the patient is home alone. Per daughter, the patient has Medicaid. CSW recommended daughter called the patient's Medicaid caseworker to determine how many, if any, hours of PCS she can be approved for per week. Daughter to reach out to [facility] caseworker.  Expected Discharge Plan: Home/Self Care Barriers to Discharge: Continued Medical Work up  Patient Goals and CMS Choice Patient states their goals for this hospitalization and ongoing recovery are:: Return home CMS Medicare.gov Compare Post Acute Care list provided to:: Patient Represenative (must comment) Choice offered to / list presented to : Adult Children,Patient  Expected Discharge Plan and Services Expected Discharge Plan: Home/Self Care In-house Referral: Clinical Social Work Post Acute Care Choice: NA Living arrangements for the past 2 months: Single Family Home  Prior Living Arrangements/Services Living arrangements for the past 2 months: Single Family Home Lives with:: Adult Children Patient language and need for interpreter reviewed:: Yes Do you feel safe going back to the place where you live?: Yes      Need for Family Participation in Patient Care: Yes (Comment) Care giver support system in place?: Yes (comment) Criminal Activity/Legal Involvement Pertinent to Current Situation/Hospitalization: No - Comment as needed  Activities  of Daily Living Home Assistive Devices/Equipment: Eyeglasses,Hearing aid ADL Screening (condition at time of admission) Patient's cognitive ability adequate to safely complete daily activities?: Yes Is the patient deaf or have difficulty hearing?: Yes Does the patient have difficulty seeing, even when wearing glasses/contacts?: Yes Does the patient have difficulty concentrating, remembering, or making decisions?: No Patient able to express need for assistance with ADLs?: Yes Does the patient have difficulty dressing or bathing?: No Independently performs ADLs?: Yes (appropriate for developmental age) Does the patient have difficulty walking or climbing stairs?: No Weakness of Legs: None Weakness of Arms/Hands: Both  Emotional Assessment Appearance:: Appears stated age Orientation: : Oriented to Self,Oriented to Place,Oriented to  Time,Oriented to Situation Alcohol / Substance Use: Not Applicable Psych Involvement: No (comment)  Admission diagnosis:  RUQ abdominal pain [R10.11] Common bile duct dilatation [K83.8] Common bile duct dilation [K83.8] Patient Active Problem List   Diagnosis Date Noted  . Common bile duct dilatation 03/10/2020  . Dyslipidemia 03/10/2020  . Acute right flank pain 03/10/2020  . Dysphagia 01/17/2020  . GIST (gastrointestinal stroma tumor), malignant, colon ([place]) 01/17/2020  . Colon cancer screening 01/17/2020  . GERD (gastroesophageal reflux disease) 01/17/2020   PCP:  Leonie Douglas, MD Pharmacy:   [facility] [place], [place] [address] [place] [address] Phone: [phone] Fax: [phone]  Readmission Risk Interventions No flowsheet data found.

## 2020-03-12 NOTE — Progress Notes (Signed)
PROGRESS NOTE    Collen Hostler  SHF:[id number] DOB: 13-Nov-1945 DOA: 03/10/2020 PCP: Leonie Douglas, MD    Brief Narrative:  Cassandra Fowler is a 75 y.o. female with medical history significant of GIST; HLD; and GERD presenting with R flank pain.  She reports that her side hurt.  Her R side has been bothering her for over a year.  This AM, it caused sharp pains and she had difficulty standing up.  It radiated to her umbilicus.  Nothing makes it worse or better.  Does not change with eating.  No N/V/D.  No chills or fever.  Last BM was yesterday and was normal.  She had GIST in June.  She had 2 tumors at that time.  She had partial gastrectomy.  She is supposed to have 4 scans and 4 blood tests a year.  She is still on chemotherapy (Gleevac).  She is due for colonoscopy on 3/24.  She was last seen by Dr. Delton Coombes for this on 2/24.  Partial gastrectomy was done on 06/19/19 and pathology showed low-grade, very low risk GIST with no + lymph nodes - which means a 1.9% metastatic rate.  She is scheduled for repeat C/A/P with contrast CT.   Assessment & Plan:   Principal Problem:   Acute right flank pain Active Problems:   GIST (gastrointestinal stroma tumor), malignant, colon (HCC)   Common bile duct dilatation   Dyslipidemia   Right flank pain -Unclear etiology at this time -Does not appear to be an obvious infectious process -MRI of the abdomen unrevealing -Concerns for underlying neuropathic pain -MRI of the thoracic spine unrevealing -MRI of the lumbar spine shows severe spinal canal stenosis at multiple levels with mild to moderate bilateral neural foraminal narrowing -Discussed with neurosurgery, it was felt that it would be reasonable for her to follow-up in clinic -She does not have any sciatic type pain, and therefore steroids may not be very helpful -We will give a trial of baclofen for any component of muscle spasm  CBD dilatation -Appreciate GI  input -Transaminases are not elevated -She will need repeat LFTs in the next 2 to 3 months for surveillance  Prior history of GIST -Status post resection of 2 tumors in the past -Currently on Gleevec -Continue to follow-up with oncology  Hyperlipidemia -Continue statin   DVT prophylaxis: heparin injection 5,000 Units Start: 03/10/20 2200  Code Status: Full code Family Communication: Discussed with daughter at the bedside Disposition Plan: Status is: Inpatient  Remains inpatient appropriate because:Ongoing active pain requiring inpatient pain management   Dispo: The patient is from: Home              Anticipated d/c is to: Home              Patient currently is not medically stable to d/c.   Difficult to place patient No         Consultants:   Gastroenterology  Procedures:     Antimicrobials:       Subjective: Continues to have pain in her right side, right lower abdomen  Objective: Vitals:   03/12/20 0500 03/12/20 1105 03/12/20 1415 03/12/20 2026  BP: (!) 148/119  (!) 108/50 (!) 149/76  Pulse: 64 71 72 67  Resp: 15  20 17   Temp: 98.1 F (36.7 C)  98.1 F (36.7 C) 98 F (36.7 C)  TempSrc: Oral  Oral   SpO2: 91% 95%  95%  Weight:      Height:  Intake/Output Summary (Last 24 hours) at 03/12/2020 2046 Last data filed at 03/12/2020 1300 Gross per 24 hour  Intake 960 ml  Output -  Net 960 ml   Filed Weights   03/10/20 1231 03/11/20 0119  Weight: 75.4 kg 75.4 kg    Examination:  General exam: Alert, awake, oriented x 3 Respiratory system: Clear to auscultation. Respiratory effort normal. Cardiovascular system:RRR. No murmurs, rubs, gallops. Gastrointestinal system: Abdomen is nondistended, soft and nontender. No organomegaly or masses felt. Normal bowel sounds heard. Central nervous system: Alert and oriented. No focal neurological deficits. Extremities: No C/C/E, +pedal pulses Skin: No rashes, lesions or ulcers Psychiatry: Judgement  and insight appear normal. Mood & affect appropriate.    Data Reviewed: I have personally reviewed following labs and imaging studies  CBC: Recent Labs  Lab 03/10/20 1328 03/11/20 0546  WBC 11.1* 11.8*  NEUTROABS 8.2*  --   HGB 14.4 11.9*  HCT 43.4 36.3  MCV 97.5 98.1  PLT 262 324   Basic Metabolic Panel: Recent Labs  Lab 03/10/20 1328 03/11/20 0546  NA 141 140  K 4.0 3.3*  CL 105 107  CO2 27 25  GLUCOSE 104* 105*  BUN 11 14  CREATININE 0.76 0.67  CALCIUM 9.5 8.6*   GFR: Estimated Creatinine Clearance: 54.1 mL/min (by C-G formula based on SCr of 0.67 mg/dL). Liver Function Tests: Recent Labs  Lab 03/10/20 1328  AST 17  ALT 15  ALKPHOS 57  BILITOT 1.0  PROT 7.4  ALBUMIN 4.5   Recent Labs  Lab 03/10/20 1328  LIPASE 92*   No results for input(s): AMMONIA in the last 168 hours. Coagulation Profile: No results for input(s): INR, PROTIME in the last 168 hours. Cardiac Enzymes: No results for input(s): CKTOTAL, CKMB, CKMBINDEX, TROPONINI in the last 168 hours. BNP (last 3 results) No results for input(s): PROBNP in the last 8760 hours. HbA1C: No results for input(s): HGBA1C in the last 72 hours. CBG: Recent Labs  Lab 03/11/20 0733 03/11/20 1116 03/11/20 1601  GLUCAP 90 90 82   Lipid Profile: No results for input(s): CHOL, HDL, LDLCALC, TRIG, CHOLHDL, LDLDIRECT in the last 72 hours. Thyroid Function Tests: No results for input(s): TSH, T4TOTAL, FREET4, T3FREE, THYROIDAB in the last 72 hours. Anemia Panel: No results for input(s): VITAMINB12, FOLATE, FERRITIN, TIBC, IRON, RETICCTPCT in the last 72 hours. Sepsis Labs: No results for input(s): PROCALCITON, LATICACIDVEN in the last 168 hours.  Recent Results (from the past 240 hour(s))  Resp Panel by RT-PCR (Flu A&B, Covid) Nasopharyngeal Swab     Status: None   Collection Time: 03/10/20  6:07 PM   Specimen: Nasopharyngeal Swab; Nasopharyngeal(NP) swabs in vial transport medium  Result Value Ref  Range Status   SARS Coronavirus 2 by RT PCR NEGATIVE NEGATIVE Final    Comment: (NOTE) SARS-CoV-2 target nucleic acids are NOT DETECTED.  The SARS-CoV-2 RNA is generally detectable in upper respiratory specimens during the acute phase of infection. The lowest concentration of SARS-CoV-2 viral copies this assay can detect is 138 copies/mL. A negative result does not preclude SARS-Cov-2 infection and should not be used as the sole basis for treatment or other patient management decisions. A negative result may occur with  improper specimen collection/handling, submission of specimen other than nasopharyngeal swab, presence of viral mutation(s) within the areas targeted by this assay, and inadequate number of viral copies(<138 copies/mL). A negative result must be combined with clinical observations, patient history, and epidemiological information. The expected result is Negative.  Fact Sheet for Patients:  EntrepreneurPulse.com.au  Fact Sheet for Healthcare Providers:  IncredibleEmployment.be  This test is no t yet approved or cleared by the [place] FDA and  has been authorized for detection and/or diagnosis of SARS-CoV-2 by FDA under an Emergency Use Authorization (EUA). This EUA will remain  in effect (meaning this test can be used) for the duration of the COVID-19 declaration under Section 564(b)(1) of the Act, 21 U.S.C.section 360bbb-3(b)(1), unless the authorization is terminated  or revoked sooner.       Influenza A by PCR NEGATIVE NEGATIVE Final   Influenza B by PCR NEGATIVE NEGATIVE Final    Comment: (NOTE) The Xpert Xpress SARS-CoV-2/FLU/RSV plus assay is intended as an aid in the diagnosis of influenza from Nasopharyngeal swab specimens and should not be used as a sole basis for treatment. Nasal washings and aspirates are unacceptable for Xpert Xpress SARS-CoV-2/FLU/RSV testing.  Fact Sheet for  Patients: EntrepreneurPulse.com.au  Fact Sheet for Healthcare Providers: IncredibleEmployment.be  This test is not yet approved or cleared by the [place] FDA and has been authorized for detection and/or diagnosis of SARS-CoV-2 by FDA under an Emergency Use Authorization (EUA). This EUA will remain in effect (meaning this test can be used) for the duration of the COVID-19 declaration under Section 564(b)(1) of the Act, 21 U.S.C. section 360bbb-3(b)(1), unless the authorization is terminated or revoked.  Performed at [facility], [address] [place], [place] [address]          Radiology Studies: MR THORACIC SPINE WO CONTRAST  Result Date: 03/12/2020 CLINICAL DATA:  Mid back pain. Low back pain. EXAM: MRI THORACIC AND LUMBAR SPINE WITHOUT CONTRAST TECHNIQUE: Multiplanar and multiecho pulse sequences of the thoracic and lumbar spine were obtained without intravenous contrast. COMPARISON:  None. FINDINGS: MRI THORACIC SPINE FINDINGS Alignment:  Physiologic. Vertebrae: No fracture, evidence of discitis, or bone lesion. Cord:  Normal signal and morphology. Paraspinal and other soft tissues: Left renal cyst. Disc levels: Small posterior disc protrusions are seen along the thoracic spine causing minimal indentation on the thecal sac without significant spinal canal or neural foraminal stenosis. MRI LUMBAR SPINE FINDINGS Segmentation:  Standard. Alignment: Small anterolisthesis of L3 over L4 and L4 over L5. Minimal retrolisthesis of L5 over S1. Vertebrae: No fracture, evidence of discitis, or bone lesion. Endplate degenerative changes at L5-S1. Conus medullaris and cauda equina: Conus extends to the L1 level. Conus and cauda equina appear normal. Paraspinal and other soft tissues: Left renal cysts, the largest measuring 4.3 cm. Disc levels: T12-L1: Disc bulge with superimposed right posterolateral disc protrusion causing indentation of the thecal sac and  resulting mild bilateral neural foraminal narrowing. No significant spinal canal stenosis. L1-2: Shallow disc bulge and mild facet degenerative changes resulting in mild left neural foraminal narrowing. No spinal canal stenosis. L2-3: Disc bulge, hypertrophic facet degenerative changes and ligamentum flavum redundancy resulting in severe spinal canal stenosis and moderate bilateral neural foraminal narrowing. L3-4: Disc bulge, hypertrophic facet degenerative changes ligamentum flavum redundancy resulting in moderate spinal canal stenosis and mild bilateral neural foraminal narrowing. L4-5: Disc bulge, prominent hypertrophic facet degenerative changes ligamentum flavum redundancy resulting in severe spinal canal stenosis and mild bilateral neural foraminal narrowing. L5-S1: Loss of disc height, disc bulge with associated osteophytic component, which is more prominent in the bilateral far lateral regions, and superimposed central disc protrusion abutting the bilateral descending S1 nerve roots. Moderate facet degenerative changes, left greater than right. Mild bilateral neural foraminal narrowing. IMPRESSION: 1. Mild thoracic  degenerative disc disease without significant spinal canal or neural foraminal stenosis at any thoracic level. 2. Severe spinal canal stenosis and moderate bilateral neural foraminal narrowing at L2-3. 3. Severe spinal canal stenosis and mild bilateral neural foraminal narrowing at L4-5. 4. Moderate spinal canal stenosis and mild bilateral neural foraminal narrowing at L3-4. 5. central disc protrusion at L5-S1 abutting the bilateral descending S1 nerve roots. Mild bilateral neural foraminal narrowing at this level. Electronically Signed   By: Pedro Earls M.D.   On: 03/12/2020 15:15   MR LUMBAR SPINE WO CONTRAST  Result Date: 03/12/2020 CLINICAL DATA:  Mid back pain. Low back pain. EXAM: MRI THORACIC AND LUMBAR SPINE WITHOUT CONTRAST TECHNIQUE: Multiplanar and multiecho pulse  sequences of the thoracic and lumbar spine were obtained without intravenous contrast. COMPARISON:  None. FINDINGS: MRI THORACIC SPINE FINDINGS Alignment:  Physiologic. Vertebrae: No fracture, evidence of discitis, or bone lesion. Cord:  Normal signal and morphology. Paraspinal and other soft tissues: Left renal cyst. Disc levels: Small posterior disc protrusions are seen along the thoracic spine causing minimal indentation on the thecal sac without significant spinal canal or neural foraminal stenosis. MRI LUMBAR SPINE FINDINGS Segmentation:  Standard. Alignment: Small anterolisthesis of L3 over L4 and L4 over L5. Minimal retrolisthesis of L5 over S1. Vertebrae: No fracture, evidence of discitis, or bone lesion. Endplate degenerative changes at L5-S1. Conus medullaris and cauda equina: Conus extends to the L1 level. Conus and cauda equina appear normal. Paraspinal and other soft tissues: Left renal cysts, the largest measuring 4.3 cm. Disc levels: T12-L1: Disc bulge with superimposed right posterolateral disc protrusion causing indentation of the thecal sac and resulting mild bilateral neural foraminal narrowing. No significant spinal canal stenosis. L1-2: Shallow disc bulge and mild facet degenerative changes resulting in mild left neural foraminal narrowing. No spinal canal stenosis. L2-3: Disc bulge, hypertrophic facet degenerative changes and ligamentum flavum redundancy resulting in severe spinal canal stenosis and moderate bilateral neural foraminal narrowing. L3-4: Disc bulge, hypertrophic facet degenerative changes ligamentum flavum redundancy resulting in moderate spinal canal stenosis and mild bilateral neural foraminal narrowing. L4-5: Disc bulge, prominent hypertrophic facet degenerative changes ligamentum flavum redundancy resulting in severe spinal canal stenosis and mild bilateral neural foraminal narrowing. L5-S1: Loss of disc height, disc bulge with associated osteophytic component, which is more  prominent in the bilateral far lateral regions, and superimposed central disc protrusion abutting the bilateral descending S1 nerve roots. Moderate facet degenerative changes, left greater than right. Mild bilateral neural foraminal narrowing. IMPRESSION: 1. Mild thoracic degenerative disc disease without significant spinal canal or neural foraminal stenosis at any thoracic level. 2. Severe spinal canal stenosis and moderate bilateral neural foraminal narrowing at L2-3. 3. Severe spinal canal stenosis and mild bilateral neural foraminal narrowing at L4-5. 4. Moderate spinal canal stenosis and mild bilateral neural foraminal narrowing at L3-4. 5. central disc protrusion at L5-S1 abutting the bilateral descending S1 nerve roots. Mild bilateral neural foraminal narrowing at this level. Electronically Signed   By: Pedro Earls M.D.   On: 03/12/2020 15:15   MR 3D Recon At Scanner  Result Date: 03/11/2020 CLINICAL DATA:  Right upper quadrant abdominal pain. EXAM: MRI ABDOMEN WITHOUT AND WITH CONTRAST (INCLUDING MRCP) TECHNIQUE: Multiplanar multisequence MR imaging of the abdomen was performed both before and after the administration of intravenous contrast. Heavily T2-weighted images of the biliary and pancreatic ducts were obtained, and three-dimensional MRCP images were rendered by post processing. CONTRAST:  33m GADAVIST GADOBUTROL 1 MMOL/ML IV  SOLN COMPARISON:  Multiple exams, including ultrasound and CT examinations from 03/10/2020 FINDINGS: Lower chest: Unremarkable Hepatobiliary: No significant abnormal enhancing liver lesion. Somewhat low position of the cystic duct attachment to the common hepatic duct. The common hepatic duct is up to 1.1 cm in diameter, with the common bile duct up to 0.9 cm in diameter tapering distally in a conical fashion without obvious filling defect. No abnormal wall enhancement in the biliary tree. There is only borderline prominence of the intrahepatic biliary tree.  The region of adenomyosis along the gallbladder is poorly seen on today's MRI. Pancreas:  Unremarkable Spleen:  Unremarkable Adrenals/Urinary Tract: Benign-appearing left renal cysts. Adrenal glands unremarkable. Stomach/Bowel: Descending and sigmoid colon diverticulosis. Vascular/Lymphatic:  Aortoiliac atherosclerotic vascular disease. Other:  No supplemental non-categorized findings. Musculoskeletal: Lumbar spondylosis and degenerative disc disease. IMPRESSION: 1. There is mild extrahepatic biliary dilatation with borderline prominence of the intrahepatic biliary tree. No filling defect in the common bile duct to suggest choledocholithiasis. 2. The region of adenomyosis along the gallbladder is poorly seen on today's MRI. 3. Lumbar spondylosis and degenerative disc disease. 4. Descending and sigmoid colon diverticulosis. 5. Benign-appearing left renal cysts. Electronically Signed   By: Van Clines M.D.   On: 03/11/2020 10:42   MR ABDOMEN MRCP W WO CONTAST  Result Date: 03/11/2020 CLINICAL DATA:  Right upper quadrant abdominal pain. EXAM: MRI ABDOMEN WITHOUT AND WITH CONTRAST (INCLUDING MRCP) TECHNIQUE: Multiplanar multisequence MR imaging of the abdomen was performed both before and after the administration of intravenous contrast. Heavily T2-weighted images of the biliary and pancreatic ducts were obtained, and three-dimensional MRCP images were rendered by post processing. CONTRAST:  44m GADAVIST GADOBUTROL 1 MMOL/ML IV SOLN COMPARISON:  Multiple exams, including ultrasound and CT examinations from 03/10/2020 FINDINGS: Lower chest: Unremarkable Hepatobiliary: No significant abnormal enhancing liver lesion. Somewhat low position of the cystic duct attachment to the common hepatic duct. The common hepatic duct is up to 1.1 cm in diameter, with the common bile duct up to 0.9 cm in diameter tapering distally in a conical fashion without obvious filling defect. No abnormal wall enhancement in the biliary  tree. There is only borderline prominence of the intrahepatic biliary tree. The region of adenomyosis along the gallbladder is poorly seen on today's MRI. Pancreas:  Unremarkable Spleen:  Unremarkable Adrenals/Urinary Tract: Benign-appearing left renal cysts. Adrenal glands unremarkable. Stomach/Bowel: Descending and sigmoid colon diverticulosis. Vascular/Lymphatic:  Aortoiliac atherosclerotic vascular disease. Other:  No supplemental non-categorized findings. Musculoskeletal: Lumbar spondylosis and degenerative disc disease. IMPRESSION: 1. There is mild extrahepatic biliary dilatation with borderline prominence of the intrahepatic biliary tree. No filling defect in the common bile duct to suggest choledocholithiasis. 2. The region of adenomyosis along the gallbladder is poorly seen on today's MRI. 3. Lumbar spondylosis and degenerative disc disease. 4. Descending and sigmoid colon diverticulosis. 5. Benign-appearing left renal cysts. Electronically Signed   By: WVan ClinesM.D.   On: 03/11/2020 10:42        Scheduled Meds: . docusate sodium  100 mg Oral BID  . heparin  5,000 Units Subcutaneous Q8H  . methylPREDNISolone (SOLU-MEDROL) injection  60 mg Intravenous Daily  . pregabalin  25 mg Oral BID  . rosuvastatin  10 mg Oral Daily   Continuous Infusions: . lactated ringers 75 mL/hr at 03/12/20 1526     LOS: 2 days    Time spent: 318ms    JeKathie DikeMD [facility]   If 7PM-7AM, please contact night-coverage [email or web address]  03/12/2020, 8:46 PM

## 2020-03-13 DIAGNOSIS — E785 Hyperlipidemia, unspecified: Secondary | ICD-10-CM | POA: Diagnosis not present

## 2020-03-13 DIAGNOSIS — C49A4 Gastrointestinal stromal tumor of large intestine: Secondary | ICD-10-CM | POA: Diagnosis not present

## 2020-03-13 DIAGNOSIS — K838 Other specified diseases of biliary tract: Secondary | ICD-10-CM | POA: Diagnosis not present

## 2020-03-13 DIAGNOSIS — R109 Unspecified abdominal pain: Secondary | ICD-10-CM | POA: Diagnosis not present

## 2020-03-13 MED ORDER — BACLOFEN 5 MG PO TABS: 5.0000 mg | ORAL_TABLET | Freq: Two times a day (BID) | ORAL | 0 refills | Status: DC

## 2020-03-13 NOTE — Progress Notes (Signed)
Patient this morning had vitals showing bradycardia. Patient stated she had a flutter feeling in her chest that lasted a couple seconds. Patient is alert and oriented, no pain. Just stated she was tired because she didn't sleep the best. On call MD made aware. New order placed for EKG. Will continue to monitor.

## 2020-03-13 NOTE — Progress Notes (Signed)
GI not actively seeing this patient. Message has been sent to GI office for outpatient follow-up.  We will sign off for now, will remove consult order. Please contact us and enter a new consult order if we can be of further assistance.  Thank you for allowing [place] to participate in the care of [place], DNP, AGNP-C Adult & Gerontological Nurse Practitioner [facility]

## 2020-03-13 NOTE — Discharge Summary (Signed)
Physician Discharge Summary  Cassandra Fowler MBW:[id number] DOB: Mar 29, 1945 DOA: 03/10/2020  PCP: Leonie Douglas, MD  Admit date: 03/10/2020 Discharge date: 03/13/2020  Admitted From: Home Disposition: Home  Recommendations for Outpatient Follow-up:  1. Follow up with PCP in 1-2 weeks 2. Please obtain BMP/CBC in one week 3. Follow-up with gastroenterology as an outpatient 4. Follow-up with neurosurgery, Dr. Marcello Moores as an outpatient  Discharge Condition: Stable CODE STATUS: Full code Diet recommendation: Heart healthy  Brief/Interim Summary: Cassandra Thibodeauis a 75 y.o.femalewith medical history significant ofGIST; HLD; and GERD presenting with R flank pain.She reports that her side hurt. Her R side has been bothering her for over a year. This AM, it caused sharp pains and she had difficulty standing up. It radiated to her umbilicus. Nothing makes it worse or better. Does not change with eating. No N/V/D. No chills or fever. Last BM was yesterday and was normal.  She had GIST in June. She had 2 tumors at that time. She had partial gastrectomy. She is supposed to have 4 scans and 4 blood tests a year. She is still on chemotherapy (Gleevac).She is due for colonoscopy on 3/24.  She was last seen by Dr. Delton Coombes for this on 2/24. Partial gastrectomy was done on 06/19/19 and pathology showed low-grade, very low risk GIST with no + lymph nodes - which means a 1.9% metastatic rate. She is scheduled for repeat C/A/P with contrast CT.  Discharge Diagnoses:  Principal Problem:   Acute right flank pain Active Problems:   GIST (gastrointestinal stroma tumor), malignant, colon (HCC)   Common bile duct dilatation   Dyslipidemia   Right flank pain -Unclear etiology at this time -Does not appear to be an obvious infectious process -MRI of the abdomen unrevealing -Concerns for underlying neuropathic pain -MRI of the thoracic spine unrevealing -MRI of the lumbar spine  shows severe spinal canal stenosis at multiple levels with mild to moderate bilateral neural foraminal narrowing -Discussed with neurosurgery, it was felt that it would be reasonable for her to follow-up in clinic -She does not have any sciatic type pain, and therefore steroids may not be very helpful -She received baclofen and reported some improvement of her symptoms  CBD dilatation -Appreciate GI input -Transaminases are not elevated -She will need repeat LFTs in the next 2 to 3 months for surveillance  Sinus bradycardia -Appears to be transient with patient was sleeping -She was subsequently placed on telemetry noted to have normal heart rate which responded appropriately to exertion -She is currently asymptomatic -We will continue to monitor and consider further work-up if she develops symptoms  Prior history of GIST -Status post resection of 2 tumors in the past -Currently on Gleevec -Continue to follow-up with oncology  Hyperlipidemia -Continue statin  Discharge Instructions  Discharge Instructions    Diet - low sodium heart healthy   Complete by: As directed    Increase activity slowly   Complete by: As directed      Allergies as of 03/13/2020      Reactions   Tape    Causes Rash   Indomethacin Hives   Tolectin [tolmetin]    Wound Dressing Adhesive    Adhesive tape   Penicillins Swelling, Rash      Medication List    TAKE these medications   acetaminophen 500 MG tablet Commonly known as: TYLENOL Take 500 mg by mouth as needed.   Baclofen 5 MG Tabs Take 5 mg by mouth 2 (two) times daily.   calcium carbonate  1500 (600 Ca) MG Tabs tablet Commonly known as: OSCAL Take 1,500 mg by mouth daily with breakfast.   D3 50 MCG (2000 UT) Tabs Generic drug: Cholecalciferol Take 1 tablet by mouth daily.   Ibuprofen 200 MG Caps Take by mouth as needed.   imatinib 400 MG tablet Commonly known as: GLEEVEC Take 400 mg by mouth daily. Take with meals and large  glass of water.Caution:Chemotherapy.   rosuvastatin 10 MG tablet Commonly known as: CRESTOR Take 10 mg by mouth daily.   vitamin C 100 MG tablet Take 100 mg by mouth daily.       Follow-up Information    Vallarie Mare, MD. Schedule an appointment as soon as possible for a visit in 2 week(s).   Specialty: Neurosurgery Contact information: [address] [place] [address] [phone]              Allergies  Allergen Reactions  . Tape     Causes Rash  . Indomethacin Hives  . Tolectin [Tolmetin]   . Wound Dressing Adhesive     Adhesive tape   . Penicillins Swelling and Rash    Consultations:  Gastroenterology   Procedures/Studies: MR THORACIC SPINE WO CONTRAST  Result Date: 03/12/2020 CLINICAL DATA:  Mid back pain. Low back pain. EXAM: MRI THORACIC AND LUMBAR SPINE WITHOUT CONTRAST TECHNIQUE: Multiplanar and multiecho pulse sequences of the thoracic and lumbar spine were obtained without intravenous contrast. COMPARISON:  None. FINDINGS: MRI THORACIC SPINE FINDINGS Alignment:  Physiologic. Vertebrae: No fracture, evidence of discitis, or bone lesion. Cord:  Normal signal and morphology. Paraspinal and other soft tissues: Left renal cyst. Disc levels: Small posterior disc protrusions are seen along the thoracic spine causing minimal indentation on the thecal sac without significant spinal canal or neural foraminal stenosis. MRI LUMBAR SPINE FINDINGS Segmentation:  Standard. Alignment: Small anterolisthesis of L3 over L4 and L4 over L5. Minimal retrolisthesis of L5 over S1. Vertebrae: No fracture, evidence of discitis, or bone lesion. Endplate degenerative changes at L5-S1. Conus medullaris and cauda equina: Conus extends to the L1 level. Conus and cauda equina appear normal. Paraspinal and other soft tissues: Left renal cysts, the largest measuring 4.3 cm. Disc levels: T12-L1: Disc bulge with superimposed right posterolateral disc protrusion causing  indentation of the thecal sac and resulting mild bilateral neural foraminal narrowing. No significant spinal canal stenosis. L1-2: Shallow disc bulge and mild facet degenerative changes resulting in mild left neural foraminal narrowing. No spinal canal stenosis. L2-3: Disc bulge, hypertrophic facet degenerative changes and ligamentum flavum redundancy resulting in severe spinal canal stenosis and moderate bilateral neural foraminal narrowing. L3-4: Disc bulge, hypertrophic facet degenerative changes ligamentum flavum redundancy resulting in moderate spinal canal stenosis and mild bilateral neural foraminal narrowing. L4-5: Disc bulge, prominent hypertrophic facet degenerative changes ligamentum flavum redundancy resulting in severe spinal canal stenosis and mild bilateral neural foraminal narrowing. L5-S1: Loss of disc height, disc bulge with associated osteophytic component, which is more prominent in the bilateral far lateral regions, and superimposed central disc protrusion abutting the bilateral descending S1 nerve roots. Moderate facet degenerative changes, left greater than right. Mild bilateral neural foraminal narrowing. IMPRESSION: 1. Mild thoracic degenerative disc disease without significant spinal canal or neural foraminal stenosis at any thoracic level. 2. Severe spinal canal stenosis and moderate bilateral neural foraminal narrowing at L2-3. 3. Severe spinal canal stenosis and mild bilateral neural foraminal narrowing at L4-5. 4. Moderate spinal canal stenosis and mild bilateral neural foraminal narrowing at L3-4.  5. central disc protrusion at L5-S1 abutting the bilateral descending S1 nerve roots. Mild bilateral neural foraminal narrowing at this level. Electronically Signed   By: Pedro Earls M.D.   On: 03/12/2020 15:15   MR LUMBAR SPINE WO CONTRAST  Result Date: 03/12/2020 CLINICAL DATA:  Mid back pain. Low back pain. EXAM: MRI THORACIC AND LUMBAR SPINE WITHOUT CONTRAST  TECHNIQUE: Multiplanar and multiecho pulse sequences of the thoracic and lumbar spine were obtained without intravenous contrast. COMPARISON:  None. FINDINGS: MRI THORACIC SPINE FINDINGS Alignment:  Physiologic. Vertebrae: No fracture, evidence of discitis, or bone lesion. Cord:  Normal signal and morphology. Paraspinal and other soft tissues: Left renal cyst. Disc levels: Small posterior disc protrusions are seen along the thoracic spine causing minimal indentation on the thecal sac without significant spinal canal or neural foraminal stenosis. MRI LUMBAR SPINE FINDINGS Segmentation:  Standard. Alignment: Small anterolisthesis of L3 over L4 and L4 over L5. Minimal retrolisthesis of L5 over S1. Vertebrae: No fracture, evidence of discitis, or bone lesion. Endplate degenerative changes at L5-S1. Conus medullaris and cauda equina: Conus extends to the L1 level. Conus and cauda equina appear normal. Paraspinal and other soft tissues: Left renal cysts, the largest measuring 4.3 cm. Disc levels: T12-L1: Disc bulge with superimposed right posterolateral disc protrusion causing indentation of the thecal sac and resulting mild bilateral neural foraminal narrowing. No significant spinal canal stenosis. L1-2: Shallow disc bulge and mild facet degenerative changes resulting in mild left neural foraminal narrowing. No spinal canal stenosis. L2-3: Disc bulge, hypertrophic facet degenerative changes and ligamentum flavum redundancy resulting in severe spinal canal stenosis and moderate bilateral neural foraminal narrowing. L3-4: Disc bulge, hypertrophic facet degenerative changes ligamentum flavum redundancy resulting in moderate spinal canal stenosis and mild bilateral neural foraminal narrowing. L4-5: Disc bulge, prominent hypertrophic facet degenerative changes ligamentum flavum redundancy resulting in severe spinal canal stenosis and mild bilateral neural foraminal narrowing. L5-S1: Loss of disc height, disc bulge with  associated osteophytic component, which is more prominent in the bilateral far lateral regions, and superimposed central disc protrusion abutting the bilateral descending S1 nerve roots. Moderate facet degenerative changes, left greater than right. Mild bilateral neural foraminal narrowing. IMPRESSION: 1. Mild thoracic degenerative disc disease without significant spinal canal or neural foraminal stenosis at any thoracic level. 2. Severe spinal canal stenosis and moderate bilateral neural foraminal narrowing at L2-3. 3. Severe spinal canal stenosis and mild bilateral neural foraminal narrowing at L4-5. 4. Moderate spinal canal stenosis and mild bilateral neural foraminal narrowing at L3-4. 5. central disc protrusion at L5-S1 abutting the bilateral descending S1 nerve roots. Mild bilateral neural foraminal narrowing at this level. Electronically Signed   By: Pedro Earls M.D.   On: 03/12/2020 15:15   CT ABDOMEN PELVIS W CONTRAST  Result Date: 03/10/2020 CLINICAL DATA:  Right lower quadrant pain. EXAM: CT ABDOMEN AND PELVIS WITH CONTRAST TECHNIQUE: Multidetector CT imaging of the abdomen and pelvis was performed using the standard protocol following bolus administration of intravenous contrast. CONTRAST:  124m OMNIPAQUE IOHEXOL 300 MG/ML  SOLN COMPARISON:  None. FINDINGS: Lower chest: Unremarkable. Hepatobiliary: No suspicious focal abnormality within the liver parenchyma. Despite lack of substantial intrahepatic biliary duct dilatation. The extrahepatic common bile duct in the head of pancreas is dilated up to 11 mm. Pancreas: No focal mass lesion. No dilatation of the main duct. No intraparenchymal cyst. No peripancreatic edema. Spleen: No splenomegaly. No focal mass lesion. Adrenals/Urinary Tract: No adrenal nodule or mass. 4.2 cm cyst  noted upper pole left kidney additional scattered tiny hypodensities in the left kidney are too small to characterize but likely benign cysts. No evidence for  hydroureter. The urinary bladder appears normal for the degree of distention. Stomach/Bowel: Surgical changes are noted in the region of the proximal stomach. Duodenum is normally positioned as is the ligament of Treitz. No small bowel wall thickening. No small bowel dilatation. The terminal ileum is normal. The appendix is not well visualized, but there is no edema or inflammation in the region of the cecum. No gross colonic mass. No colonic wall thickening. Diverticular changes are noted in the left colon without evidence of diverticulitis. Vascular/Lymphatic: There is abdominal aortic atherosclerosis without aneurysm. There is no gastrohepatic or hepatoduodenal ligament lymphadenopathy. No retroperitoneal or mesenteric lymphadenopathy. No pelvic sidewall lymphadenopathy. Reproductive: The uterus is surgically absent. There is no adnexal mass. Other: No intraperitoneal free fluid. Musculoskeletal: No worrisome lytic or sclerotic osseous abnormality. IMPRESSION: 1. No acute findings in the abdomen or pelvis. Specifically, no findings to explain the patient's history of right lower quadrant pain. 2. Common bile duct dilatation up to 11 mm diameter. No obstructing stone or mass lesion evident and no associated dilatation of the main pancreatic duct. MRCP could be used to further evaluate as clinically warranted. 3. Left colonic diverticulosis without diverticulitis. 4. Aortic Atherosclerosis (ICD10-I70.0). Electronically Signed   By: Misty Stanley M.D.   On: 03/10/2020 15:38   MR 3D Recon At Scanner  Result Date: 03/11/2020 CLINICAL DATA:  Right upper quadrant abdominal pain. EXAM: MRI ABDOMEN WITHOUT AND WITH CONTRAST (INCLUDING MRCP) TECHNIQUE: Multiplanar multisequence MR imaging of the abdomen was performed both before and after the administration of intravenous contrast. Heavily T2-weighted images of the biliary and pancreatic ducts were obtained, and three-dimensional MRCP images were rendered by post  processing. CONTRAST:  53m GADAVIST GADOBUTROL 1 MMOL/ML IV SOLN COMPARISON:  Multiple exams, including ultrasound and CT examinations from 03/10/2020 FINDINGS: Lower chest: Unremarkable Hepatobiliary: No significant abnormal enhancing liver lesion. Somewhat low position of the cystic duct attachment to the common hepatic duct. The common hepatic duct is up to 1.1 cm in diameter, with the common bile duct up to 0.9 cm in diameter tapering distally in a conical fashion without obvious filling defect. No abnormal wall enhancement in the biliary tree. There is only borderline prominence of the intrahepatic biliary tree. The region of adenomyosis along the gallbladder is poorly seen on today's MRI. Pancreas:  Unremarkable Spleen:  Unremarkable Adrenals/Urinary Tract: Benign-appearing left renal cysts. Adrenal glands unremarkable. Stomach/Bowel: Descending and sigmoid colon diverticulosis. Vascular/Lymphatic:  Aortoiliac atherosclerotic vascular disease. Other:  No supplemental non-categorized findings. Musculoskeletal: Lumbar spondylosis and degenerative disc disease. IMPRESSION: 1. There is mild extrahepatic biliary dilatation with borderline prominence of the intrahepatic biliary tree. No filling defect in the common bile duct to suggest choledocholithiasis. 2. The region of adenomyosis along the gallbladder is poorly seen on today's MRI. 3. Lumbar spondylosis and degenerative disc disease. 4. Descending and sigmoid colon diverticulosis. 5. Benign-appearing left renal cysts. Electronically Signed   By: WVan ClinesM.D.   On: 03/11/2020 10:42   MR ABDOMEN MRCP W WO CONTAST  Result Date: 03/11/2020 CLINICAL DATA:  Right upper quadrant abdominal pain. EXAM: MRI ABDOMEN WITHOUT AND WITH CONTRAST (INCLUDING MRCP) TECHNIQUE: Multiplanar multisequence MR imaging of the abdomen was performed both before and after the administration of intravenous contrast. Heavily T2-weighted images of the biliary and pancreatic  ducts were obtained, and three-dimensional MRCP images were rendered by post  processing. CONTRAST:  6m GADAVIST GADOBUTROL 1 MMOL/ML IV SOLN COMPARISON:  Multiple exams, including ultrasound and CT examinations from 03/10/2020 FINDINGS: Lower chest: Unremarkable Hepatobiliary: No significant abnormal enhancing liver lesion. Somewhat low position of the cystic duct attachment to the common hepatic duct. The common hepatic duct is up to 1.1 cm in diameter, with the common bile duct up to 0.9 cm in diameter tapering distally in a conical fashion without obvious filling defect. No abnormal wall enhancement in the biliary tree. There is only borderline prominence of the intrahepatic biliary tree. The region of adenomyosis along the gallbladder is poorly seen on today's MRI. Pancreas:  Unremarkable Spleen:  Unremarkable Adrenals/Urinary Tract: Benign-appearing left renal cysts. Adrenal glands unremarkable. Stomach/Bowel: Descending and sigmoid colon diverticulosis. Vascular/Lymphatic:  Aortoiliac atherosclerotic vascular disease. Other:  No supplemental non-categorized findings. Musculoskeletal: Lumbar spondylosis and degenerative disc disease. IMPRESSION: 1. There is mild extrahepatic biliary dilatation with borderline prominence of the intrahepatic biliary tree. No filling defect in the common bile duct to suggest choledocholithiasis. 2. The region of adenomyosis along the gallbladder is poorly seen on today's MRI. 3. Lumbar spondylosis and degenerative disc disease. 4. Descending and sigmoid colon diverticulosis. 5. Benign-appearing left renal cysts. Electronically Signed   By: WVan ClinesM.D.   On: 03/11/2020 10:42   UKoreaAbdomen Limited RUQ (LIVER/GB)  Result Date: 03/10/2020 CLINICAL DATA:  Right upper quadrant pain EXAM: ULTRASOUND ABDOMEN LIMITED RIGHT UPPER QUADRANT COMPARISON:  CT 03/10/2020.  Ultrasound 02/04/2020. FINDINGS: Gallbladder: Gallbladder wall calcifications again noted along the anterior  gallbladder wall with comet tail artifact. Common bile duct: Diameter: Prominent measuring up to 11 mm as seen on earlier CT. No visible ductal stones. The distal duct is obscured by overlying bowel gas. Liver: No focal lesion identified. Within normal limits in parenchymal echogenicity. Portal vein is patent on color Doppler imaging with normal direction of blood flow towards the liver. Other: None. IMPRESSION: Prominent common bile duct measuring up to 11 mm. No visible ductal stones although the distal duct is obscured by overlying bowel gas. 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 Electronically Signed   By: KRolm BaptiseM.D.   On: 03/10/2020 16:59       Subjective: Patient is feeling better.  Reports that flank/abdominal pain is better today.  She is anxious to go home.  She is ambulating in the halls.  Did have transient bradycardia overnight.  Denied any chest pain or shortness of breath/dizziness during these episodes.  She was sleeping at the time.  Discharge Exam: Vitals:   03/12/20 2026 03/13/20 0458 03/13/20 0500 03/13/20 0502  BP: (!) 149/76 125/72    Pulse: 67 (!) 112 (!) 41 (!) 45  Resp: 17 16    Temp: 98 F (36.7 C) 97.7 F (36.5 C)    TempSrc: Oral     SpO2: 95% 94% 93% 94%  Weight:      Height:        General: Pt is alert, awake, not in acute distress Cardiovascular: RRR, S1/S2 +, no rubs, no gallops Respiratory: CTA bilaterally, no wheezing, no rhonchi Abdominal: Soft, NT, ND, bowel sounds + Extremities: no edema, no cyanosis    The results of significant diagnostics from this hospitalization (including imaging, microbiology, ancillary and laboratory) are listed below for reference.     Microbiology: Recent Results (from the past 240 hour(s))  Resp Panel by RT-PCR (Flu A&B, Covid) Nasopharyngeal  Swab     Status: None    Collection Time: 03/10/20  6:07 PM   Specimen: Nasopharyngeal Swab; Nasopharyngeal(NP) swabs in vial transport medium  Result Value Ref Range Status   SARS Coronavirus 2 by RT PCR NEGATIVE NEGATIVE Final    Comment: (NOTE) SARS-CoV-2 target nucleic acids are NOT DETECTED.  The SARS-CoV-2 RNA is generally detectable in upper respiratory specimens during the acute phase of infection. The lowest concentration of SARS-CoV-2 viral copies this assay can detect is 138 copies/mL. A negative result does not preclude SARS-Cov-2 infection and should not be used as the sole basis for treatment or other patient management decisions. A negative result may occur with  improper specimen collection/handling, submission of specimen other than nasopharyngeal swab, presence of viral mutation(s) within the areas targeted by this assay, and inadequate number of viral copies(<138 copies/mL). A negative result must be combined with clinical observations, patient history, and epidemiological information. The expected result is Negative.  Fact Sheet for Patients:  EntrepreneurPulse.com.au  Fact Sheet for Healthcare Providers:  IncredibleEmployment.be  This test is no t yet approved or cleared by the [place] FDA and  has been authorized for detection and/or diagnosis of SARS-CoV-2 by FDA under an Emergency Use Authorization (EUA). This EUA will remain  in effect (meaning this test can be used) for the duration of the COVID-19 declaration under Section 564(b)(1) of the Act, 21 U.S.C.section 360bbb-3(b)(1), unless the authorization is terminated  or revoked sooner.       Influenza A by PCR NEGATIVE NEGATIVE Final   Influenza B by PCR NEGATIVE NEGATIVE Final    Comment: (NOTE) The Xpert Xpress SARS-CoV-2/FLU/RSV plus assay is intended as an aid in the diagnosis of influenza from Nasopharyngeal swab specimens and should not be used as a sole basis for treatment.  Nasal washings and aspirates are unacceptable for Xpert Xpress SARS-CoV-2/FLU/RSV testing.  Fact Sheet for Patients: EntrepreneurPulse.com.au  Fact Sheet for Healthcare Providers: IncredibleEmployment.be  This test is not yet approved or cleared by the [place] FDA and has been authorized for detection and/or diagnosis of SARS-CoV-2 by FDA under an Emergency Use Authorization (EUA). This EUA will remain in effect (meaning this test can be used) for the duration of the COVID-19 declaration under Section 564(b)(1) of the Act, 21 U.S.C. section 360bbb-3(b)(1), unless the authorization is terminated or revoked.  Performed at [facility], [address], [place], [place] [address]      Labs: BNP (last 3 results) No results for input(s): BNP in the last 8760 hours. Basic Metabolic Panel: Recent Labs  Lab 03/10/20 1328 03/11/20 0546  NA 141 140  K 4.0 3.3*  CL 105 107  CO2 27 25  GLUCOSE 104* 105*  BUN 11 14  CREATININE 0.76 0.67  CALCIUM 9.5 8.6*   Liver Function Tests: Recent Labs  Lab 03/10/20 1328  AST 17  ALT 15  ALKPHOS 57  BILITOT 1.0  PROT 7.4  ALBUMIN 4.5   Recent Labs  Lab 03/10/20 1328  LIPASE 92*   No results for input(s): AMMONIA in the last 168 hours. CBC: Recent Labs  Lab 03/10/20 1328 03/11/20 0546  WBC 11.1* 11.8*  NEUTROABS 8.2*  --   HGB 14.4 11.9*  HCT 43.4 36.3  MCV 97.5 98.1  PLT 262 219   Cardiac Enzymes: No results for input(s): CKTOTAL, CKMB, CKMBINDEX, TROPONINI in the last 168 hours. BNP: Invalid input(s): POCBNP CBG: Recent Labs  Lab 03/11/20 0733 03/11/20 1116 03/11/20 1601  GLUCAP 90  90 82   D-Dimer No results for input(s): DDIMER in the last 72 hours. Hgb A1c No results for input(s): HGBA1C in the last 72 hours. Lipid Profile No results for input(s): CHOL, HDL, LDLCALC, TRIG, CHOLHDL, LDLDIRECT in the last 72 hours. Thyroid function studies No results for input(s):  TSH, T4TOTAL, T3FREE, THYROIDAB in the last 72 hours.  Invalid input(s): FREET3 Anemia work up No results for input(s): VITAMINB12, FOLATE, FERRITIN, TIBC, IRON, RETICCTPCT in the last 72 hours. Urinalysis    Component Value Date/Time   COLORURINE STRAW (A) 03/10/2020 1328   APPEARANCEUR CLEAR 03/10/2020 1328   LABSPEC 1.002 (L) 03/10/2020 1328   PHURINE 8.0 03/10/2020 1328   GLUCOSEU NEGATIVE 03/10/2020 1328   HGBUR NEGATIVE 03/10/2020 1328   BILIRUBINUR NEGATIVE 03/10/2020 1328   KETONESUR NEGATIVE 03/10/2020 1328   PROTEINUR NEGATIVE 03/10/2020 1328   NITRITE NEGATIVE 03/10/2020 [place] 03/10/2020 1328   Sepsis Labs Invalid input(s): PROCALCITONIN,  WBC,  LACTICIDVEN Microbiology Recent Results (from the past 240 hour(s))  Resp Panel by RT-PCR (Flu A&B, Covid) Nasopharyngeal Swab     Status: None   Collection Time: 03/10/20  6:07 PM   Specimen: Nasopharyngeal Swab; Nasopharyngeal(NP) swabs in vial transport medium  Result Value Ref Range Status   SARS Coronavirus 2 by RT PCR NEGATIVE NEGATIVE Final    Comment: (NOTE) SARS-CoV-2 target nucleic acids are NOT DETECTED.  The SARS-CoV-2 RNA is generally detectable in upper respiratory specimens during the acute phase of infection. The lowest concentration of SARS-CoV-2 viral copies this assay can detect is 138 copies/mL. A negative result does not preclude SARS-Cov-2 infection and should not be used as the sole basis for treatment or other patient management decisions. A negative result may occur with  improper specimen collection/handling, submission of specimen other than nasopharyngeal swab, presence of viral mutation(s) within the areas targeted by this assay, and inadequate number of viral copies(<138 copies/mL). A negative result must be combined with clinical observations, patient history, and epidemiological information. The expected result is Negative.  Fact Sheet for Patients:   EntrepreneurPulse.com.au  Fact Sheet for Healthcare Providers:  IncredibleEmployment.be  This test is no t yet approved or cleared by the [place] FDA and  has been authorized for detection and/or diagnosis of SARS-CoV-2 by FDA under an Emergency Use Authorization (EUA). This EUA will remain  in effect (meaning this test can be used) for the duration of the COVID-19 declaration under Section 564(b)(1) of the Act, 21 U.S.C.section 360bbb-3(b)(1), unless the authorization is terminated  or revoked sooner.       Influenza A by PCR NEGATIVE NEGATIVE Final   Influenza B by PCR NEGATIVE NEGATIVE Final    Comment: (NOTE) The Xpert Xpress SARS-CoV-2/FLU/RSV plus assay is intended as an aid in the diagnosis of influenza from Nasopharyngeal swab specimens and should not be used as a sole basis for treatment. Nasal washings and aspirates are unacceptable for Xpert Xpress SARS-CoV-2/FLU/RSV testing.  Fact Sheet for Patients: EntrepreneurPulse.com.au  Fact Sheet for Healthcare Providers: IncredibleEmployment.be  This test is not yet approved or cleared by the [place] FDA and has been authorized for detection and/or diagnosis of SARS-CoV-2 by FDA under an Emergency Use Authorization (EUA). This EUA will remain in effect (meaning this test can be used) for the duration of the COVID-19 declaration under Section 564(b)(1) of the Act, 21 U.S.C. section 360bbb-3(b)(1), unless the authorization is terminated or revoked.  Performed at [facility], [address] [place], [place] [address]  Time coordinating discharge: 82mns  SIGNED:   JKathie Dike MD  [facility] 03/13/2020, 8:32 PM   If 7PM-7AM, please contact night-coverage [email or web address]

## 2020-03-13 NOTE — Care Management Important Message (Signed)
Important Message  Patient Details  Name: Cassandra Fowler MRN: [id number] Date of Birth: 09-06-1945   Medicare Important Message Given:  Yes     [name] 03/13/2020, 12:31 PM

## 2020-03-13 NOTE — Progress Notes (Signed)
Pt ambulated well down the hall with no assistance. Patient's HR fluctuated between 80-110.

## 2020-03-30 ENCOUNTER — Telehealth: Payer: Self-pay | Admitting: Internal Medicine

## 2020-03-30 ENCOUNTER — Other Ambulatory Visit (HOSPITAL_COMMUNITY): Payer: Medicare Other

## 2020-03-30 ENCOUNTER — Telehealth (HOSPITAL_COMMUNITY): Payer: Self-pay | Admitting: Surgery

## 2020-03-30 NOTE — Telephone Encounter (Signed)
Patient states she was abel to get instructions on her computer. Nothing further needed

## 2020-03-30 NOTE — Telephone Encounter (Signed)
Pt is scheduled with Dr Gala Romney on Thursday 04/02/2020 and asked for me to send her prep instructions to 2 different emails. She LMOM that she isn't able to open attachment on her phone. Please call her at [phone]

## 2020-03-30 NOTE — Telephone Encounter (Signed)
Pt had left a voicemail asking about why she was having a Covid test tomorrow.    I called and spoke to the pt's daughter, per her request on the voicemail, and let her daughter know that it was a pre procedure covid test before her endoscopy on 04/02/20.  Pt's daughter verbalized understanding.

## 2020-03-31 ENCOUNTER — Inpatient Hospital Stay (HOSPITAL_COMMUNITY): Payer: Medicare Other | Attending: Hematology

## 2020-03-31 ENCOUNTER — Other Ambulatory Visit: Payer: Self-pay

## 2020-03-31 ENCOUNTER — Inpatient Hospital Stay (HOSPITAL_COMMUNITY): Payer: Medicare Other

## 2020-03-31 ENCOUNTER — Other Ambulatory Visit (HOSPITAL_COMMUNITY)
Admission: RE | Admit: 2020-03-31 | Discharge: 2020-03-31 | Disposition: A | Discharge status: Home / Self Care | Payer: Medicare Other | Source: Ambulatory Visit | Attending: Internal Medicine | Admitting: Internal Medicine

## 2020-03-31 DIAGNOSIS — Z20822 Contact with and (suspected) exposure to covid-19: Secondary | ICD-10-CM | POA: Diagnosis not present

## 2020-03-31 DIAGNOSIS — Z01812 Encounter for preprocedural laboratory examination: Secondary | ICD-10-CM | POA: Diagnosis present

## 2020-03-31 DIAGNOSIS — Z8509 Personal history of malignant neoplasm of other digestive organs: Secondary | ICD-10-CM

## 2020-03-31 DIAGNOSIS — C49A2 Gastrointestinal stromal tumor of stomach: Secondary | ICD-10-CM | POA: Insufficient documentation

## 2020-03-31 LAB — SARS CORONAVIRUS 2 (TAT 6-24 HRS): SARS Coronavirus 2: NEGATIVE

## 2020-03-31 LAB — CBC WITH DIFFERENTIAL/PLATELET
Abs Immature Granulocytes: 0.02 10*3/uL (ref 0.00–0.07)
Basophils Absolute: 0 10*3/uL (ref 0.0–0.1)
Basophils Relative: 0 %
Eosinophils Absolute: 0.3 10*3/uL (ref 0.0–0.5)
Eosinophils Relative: 3 %
HCT: 38.2 % (ref 36.0–46.0)
Hemoglobin: 12.8 g/dL (ref 12.0–15.0)
Immature Granulocytes: 0 %
Lymphocytes Relative: 20 %
Lymphs Abs: 1.9 10*3/uL (ref 0.7–4.0)
MCH: 33.2 pg (ref 26.0–34.0)
MCHC: 33.5 g/dL (ref 30.0–36.0)
MCV: 99 fL (ref 80.0–100.0)
Monocytes Absolute: 0.7 10*3/uL (ref 0.1–1.0)
Monocytes Relative: 7 %
Neutro Abs: 6.6 10*3/uL (ref 1.7–7.7)
Neutrophils Relative %: 70 %
Platelets: 228 10*3/uL (ref 150–400)
RBC: 3.86 MIL/uL — ABNORMAL LOW (ref 3.87–5.11)
RDW: 14 % (ref 11.5–15.5)
WBC: 9.6 10*3/uL (ref 4.0–10.5)
nRBC: 0 % (ref 0.0–0.2)

## 2020-03-31 LAB — COMPREHENSIVE METABOLIC PANEL
ALT: 17 U/L (ref 0–44)
AST: 16 U/L (ref 15–41)
Albumin: 4.1 g/dL (ref 3.5–5.0)
Alkaline Phosphatase: 56 U/L (ref 38–126)
Anion gap: 8 (ref 5–15)
BUN: 11 mg/dL (ref 8–23)
CO2: 27 mmol/L (ref 22–32)
Calcium: 9.2 mg/dL (ref 8.9–10.3)
Chloride: 105 mmol/L (ref 98–111)
Creatinine, Ser: 0.79 mg/dL (ref 0.44–1.00)
GFR, Estimated: >60 mL/min (ref >60)
Glucose, Bld: 102 mg/dL — ABNORMAL HIGH (ref 70–99)
Potassium: 3.8 mmol/L (ref 3.5–5.1)
Sodium: 140 mmol/L (ref 135–145)
Total Bilirubin: 1 mg/dL (ref 0.3–1.2)
Total Protein: 6.7 g/dL (ref 6.5–8.1)

## 2020-04-02 ENCOUNTER — Other Ambulatory Visit: Payer: Self-pay

## 2020-04-02 ENCOUNTER — Ambulatory Visit (HOSPITAL_COMMUNITY): Payer: Medicare Other | Admitting: Anesthesiology

## 2020-04-02 ENCOUNTER — Encounter (HOSPITAL_COMMUNITY)
Admission: RE | Disposition: A | Discharge status: Home / Self Care | Payer: Self-pay | Source: Home / Self Care | Attending: Internal Medicine

## 2020-04-02 ENCOUNTER — Encounter (HOSPITAL_COMMUNITY): Payer: Self-pay | Admitting: Internal Medicine

## 2020-04-02 ENCOUNTER — Ambulatory Visit (HOSPITAL_COMMUNITY)
Admission: RE | Admit: 2020-04-02 | Discharge: 2020-04-02 | Disposition: A | Discharge status: Home / Self Care | Payer: Medicare Other | Attending: Internal Medicine | Admitting: Internal Medicine

## 2020-04-02 DIAGNOSIS — R1310 Dysphagia, unspecified: Secondary | ICD-10-CM | POA: Diagnosis not present

## 2020-04-02 DIAGNOSIS — K621 Rectal polyp: Secondary | ICD-10-CM | POA: Diagnosis not present

## 2020-04-02 DIAGNOSIS — K5520 Angiodysplasia of colon without hemorrhage: Secondary | ICD-10-CM | POA: Diagnosis not present

## 2020-04-02 DIAGNOSIS — F17210 Nicotine dependence, cigarettes, uncomplicated: Secondary | ICD-10-CM | POA: Diagnosis not present

## 2020-04-02 DIAGNOSIS — Z888 Allergy status to other drugs, medicaments and biological substances status: Secondary | ICD-10-CM | POA: Insufficient documentation

## 2020-04-02 DIAGNOSIS — D122 Benign neoplasm of ascending colon: Secondary | ICD-10-CM | POA: Diagnosis not present

## 2020-04-02 DIAGNOSIS — Z886 Allergy status to analgesic agent status: Secondary | ICD-10-CM | POA: Diagnosis not present

## 2020-04-02 DIAGNOSIS — K635 Polyp of colon: Secondary | ICD-10-CM | POA: Diagnosis not present

## 2020-04-02 DIAGNOSIS — Z1211 Encounter for screening for malignant neoplasm of colon: Secondary | ICD-10-CM | POA: Diagnosis not present

## 2020-04-02 DIAGNOSIS — Z86010 Personal history of colonic polyps: Secondary | ICD-10-CM

## 2020-04-02 DIAGNOSIS — K2100 Gastro-esophageal reflux disease with esophagitis, without bleeding: Secondary | ICD-10-CM | POA: Diagnosis not present

## 2020-04-02 DIAGNOSIS — Z880 Allergy status to penicillin: Secondary | ICD-10-CM | POA: Insufficient documentation

## 2020-04-02 DIAGNOSIS — K5730 Diverticulosis of large intestine without perforation or abscess without bleeding: Secondary | ICD-10-CM | POA: Diagnosis not present

## 2020-04-02 HISTORY — PX: COLONOSCOPY WITH PROPOFOL: SHX5780

## 2020-04-02 HISTORY — PX: POLYPECTOMY: SHX5525

## 2020-04-02 HISTORY — PX: MALONEY DILATION: SHX5535

## 2020-04-02 HISTORY — PX: ESOPHAGOGASTRODUODENOSCOPY (EGD) WITH PROPOFOL: SHX5813

## 2020-04-02 SURGERY — COLONOSCOPY WITH PROPOFOL
Anesthesia: General

## 2020-04-02 MED ORDER — LACTATED RINGERS IV SOLN: INTRAVENOUS | Status: DC

## 2020-04-02 MED ORDER — PROPOFOL 500 MG/50ML IV EMUL
INTRAVENOUS | Status: DC | PRN
  Administered 2020-04-02: 150 ug/kg/min via INTRAVENOUS

## 2020-04-02 MED ORDER — PROPOFOL 10 MG/ML IV BOLUS
INTRAVENOUS | Status: AC
  Filled 2020-04-02: qty 60

## 2020-04-02 MED ORDER — PROPOFOL 10 MG/ML IV BOLUS
INTRAVENOUS | Status: DC | PRN
  Administered 2020-04-02: 50 mg via INTRAVENOUS

## 2020-04-02 MED ORDER — PANTOPRAZOLE SODIUM 40 MG PO TBEC: 40.0000 mg | DELAYED_RELEASE_TABLET | Freq: Every day | ORAL | 3 refills | Status: DC

## 2020-04-02 NOTE — Anesthesia Preprocedure Evaluation (Signed)
Anesthesia Evaluation  Patient identified by MRN, date of birth, ID band Patient awake    Reviewed: Allergy & Precautions, H&P , NPO status , Patient's Chart, lab work & pertinent test results, reviewed documented beta blocker date and time   Airway Mallampati: II  TM Distance: >3 FB Neck ROM: full    Dental no notable dental hx.    Pulmonary neg pulmonary ROS, Current Smoker and Patient abstained from smoking.,    Pulmonary exam normal breath sounds clear to auscultation       Cardiovascular Exercise Tolerance: Good negative cardio ROS   Rhythm:regular Rate:Normal     Neuro/Psych negative neurological ROS  negative psych ROS   GI/Hepatic negative GI ROS, Neg liver ROS, GERD  Medicated,  Endo/Other  negative endocrine ROS  Renal/GU negative Renal ROS  negative genitourinary   Musculoskeletal   Abdominal   Peds  Hematology negative hematology ROS (+)   Anesthesia Other Findings   Reproductive/Obstetrics negative OB ROS                             Anesthesia Physical Anesthesia Plan  ASA: II  Anesthesia Plan: General   Post-op Pain Management:    Induction:   PONV Risk Score and Plan: Propofol infusion  Airway Management Planned:   Additional Equipment:   Intra-op Plan:   Post-operative Plan:   Informed Consent: I have reviewed the patients History and Physical, chart, labs and discussed the procedure including the risks, benefits and alternatives for the proposed anesthesia with the patient or authorized representative who has indicated his/her understanding and acceptance.     Dental Advisory Given  Plan Discussed with: CRNA  Anesthesia Plan Comments:         Anesthesia Quick Evaluation

## 2020-04-02 NOTE — Anesthesia Postprocedure Evaluation (Signed)
Anesthesia Post Note  [name]  Procedure(s) Performed: COLONOSCOPY WITH PROPOFOL (N/A ) ESOPHAGOGASTRODUODENOSCOPY (EGD) WITH PROPOFOL (N/A ) MALONEY DILATION (N/A ) POLYPECTOMY  Patient location during evaluation: Phase II Anesthesia Type: General Level of consciousness: awake and patient cooperative Pain management: pain level controlled Vital Signs Assessment: post-procedure vital signs reviewed and stable Respiratory status: spontaneous breathing, respiratory function stable and nonlabored ventilation Cardiovascular status: stable Postop Assessment: no apparent nausea or vomiting Anesthetic complications: no   No complications documented.   Last Vitals:  Vitals:   04/02/20 0654 04/02/20 0829  BP: 129/63 108/60  Pulse: 68 70  Resp: 18 16  Temp: 36.7 C 36.5 C  SpO2: 97% 95%    Last Pain:  Vitals:   04/02/20 0829  TempSrc: Oral  PainSc: 0-No pain                 Cassandra Fowler

## 2020-04-02 NOTE — Op Note (Signed)
[facility] Patient Name: Cassandra Fowler Procedure Date: 04/02/2020 7:57 AM MRN: [id number] Date of Birth: 1945-10-22 Attending MD: Norvel Richards , MD CSN: [id number] Age: 75 Admit Type: Outpatient Procedure:                Colonoscopy Indications:              High risk colon cancer surveillance: Personal                            history of colonic polyps Providers:                Norvel Richards, MD, Gwenlyn Fudge, RN, Aram Candela Referring MD:              Medicines:                Propofol per Anesthesia Complications:            No immediate complications. Estimated Blood Loss:     Estimated blood loss was minimal. Procedure:                Pre-Anesthesia Assessment:                           - Prior to the procedure, a History and Physical                            was performed, and patient medications and                            allergies were reviewed. The patient's tolerance of                            previous anesthesia was also reviewed. The risks                            and benefits of the procedure and the sedation                            options and risks were discussed with the patient.                            All questions were answered, and informed consent                            was obtained. Prior Anticoagulants: The patient has                            taken no previous anticoagulant or antiplatelet                            agents. ASA Grade Assessment: III - A patient with  severe systemic disease. After reviewing the risks                            and benefits, the patient was deemed in                            satisfactory condition to undergo the procedure.                           After obtaining informed consent, the colonoscope                            was passed under direct vision. Throughout the                            procedure, the patient's  blood pressure, pulse, and                            oxygen saturations were monitored continuously. The                            [id number]-[id number] ([id number]) scope was introduced through                            the anus and advanced to the the cecum, identified                            by appendiceal orifice and ileocecal valve. The                            colonoscopy was performed without difficulty. The                            patient tolerated the procedure well. The quality                            of the bowel preparation was adequate. Scope In: 8:02:22 AM Scope Out: 8:23:44 AM Scope Withdrawal Time: 0 hours 10 minutes 12 seconds  Total Procedure Duration: 0 hours 21 minutes 22 seconds  Findings:      Scattered medium-mouthed diverticula were found in the sigmoid colon and       descending colon.      Three semi-pedunculated polyps were found in the rectum, mid rectum and       ascending colon. The polyps were 5 to 8 mm in size. These polyps were       removed with a cold snare. Resection and retrieval were complete.       Estimated blood loss was minimal. 5 mm innocent appearing cecal AVM Impression:               - Diverticulosis in the sigmoid colon and in the                            descending colon. Cecal AVM                           -  Three 5 to 8 mm polyps in the rectum, in the mid                            rectum and in the ascending colon, removed with a                            cold snare. Resected and retrieved. Moderate Sedation:      Moderate (conscious) sedation was personally administered by an       anesthesia professional. The following parameters were monitored: oxygen       saturation, heart rate, blood pressure, respiratory rate, EKG, adequacy       of pulmonary ventilation, and response to care. Recommendation:           - Patient has a contact number available for                            emergencies. The signs and symptoms of potential                             delayed complications were discussed with the                            patient. Return to normal activities tomorrow.                            Written discharge instructions were provided to the                            patient.                           - Advance diet as tolerated. Begin Protonix 40 mg                            once daily office visit with [place] in 6 weeks Procedure Code(s):        --- Professional ---                           [phone], Colonoscopy, flexible; with removal of                            tumor(s), polyp(s), or other lesion(s) by snare                            technique Diagnosis Code(s):        --- Professional ---                           Z86.010, Personal history of colonic polyps                           K62.1, Rectal polyp                           K63.5, Polyp of colon  K57.30, Diverticulosis of large intestine without                            perforation or abscess without bleeding CPT copyright 2019 American Medical Association. All rights reserved. The codes documented in this report are preliminary and upon coder review may  be revised to meet current compliance requirements. Cristopher Estimable. Jensine Luz, MD Norvel Richards, MD 04/02/2020 8:36:24 AM This report has been signed electronically. Number of Addenda: 0

## 2020-04-02 NOTE — H&P (Signed)
@LOGO @   Primary Care Physician:  Leonie Douglas, MD Primary Gastroenterologist:  Dr. Gala Romney  Pre-Procedure History & Physical: HPI:  Cassandra Fowler is a 75 y.o. 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  Cystic dysphagia dysmotility on BPE.  No obstruction to pill.  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  History of multiple colonic polyps [place] years ago out of state.  History of calcified gallbladder on imaging some dilation of the CBD distally but no LFT abnormality. There are multiple polyps removed previously [place]-state.  Here for surveillance colonoscopy. Patient denies right-sided abdominal pain.  Past Medical History:  Diagnosis Date  . Achalasia   . Arthritis   . GERD (gastroesophageal reflux disease)   . 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 HLD (hyperlipidemia)     Past Surgical History:  Procedure Laterality Date  . BREAST BIOPSY Right 09/06/2019   [facility]; evaluation of suspicious calcification of right breast; pathology with atypical ductal hyperplasia, focal cystic dilation of ducts with columnar cell change of epithelium, presence of microcalcifications.  Marland Kitchen CATARACT EXTRACTION Right 03/28/2017   [facility]  . CATARACT EXTRACTION Left 03/14/2017   [facility]  . COLONOSCOPY  01/28/2013   Dr. Ok Edwards, [place], [place]; Hyperplastic polyp x2, sessile serrated adenoma x2, tubular adenoma with low-grade dysplasia x1.  Recommend repeat colonoscopy in 3 years.  . ESOPHAGOGASTRODUODENOSCOPY  04/24/2019   [place]; 2.4 cm gastric submucosal mass in the fundus consistent with GIST.  FNA consistent with GIST.  Marland Kitchen ESOPHAGOGASTRODUODENOSCOPY  01/28/2013   Dr. Gwen Her Devers in [place], [place]; normal  esophagus, hernia at GE junction, normal examined stomach, normal examined duodenum.  Does not appear dilation was performed.  Marland Kitchen GIST tumor resection  06/19/2019   [facility], robotic assisted laparoscopic partial gastrectomy x2.  Pathology consistent with GIST  . HELLER MYOTOMY  06/19/2019   [facility]; esophageal myotomy with hiatal hernia repair and dor fundoplication  . left hand surgery    . tubal ligation    . UMBILICAL HERNIA REPAIR     1970s  . UPPER ESOPHAGEAL ENDOSCOPIC ULTRASOUND (EUS)  04/24/2019   [facility]; endoscopic findings: Tortuous esophagus s/p balloon dilation to 18 mm with no effect s/p biopsy, submucosal mass in the fundus, normal duodenum; endosonographic findings: 2.4 cm x 2.4 cm submucosal mass in the fundus s/p FNA, cholelithiasis, no hepatic, CBD, or pancreatic abnormalities.  Pathology: GIST, hepatic parenchyma with mild steato-fibrosis, reflux esophagitis  . YAG laser posterior capsulotomy Left 08/30/2018   [facility], left eye.    Prior to Admission medications   Medication Sig Start Date End Date Taking? Authorizing Provider  acetaminophen (TYLENOL) 500 MG tablet Take 500 mg by mouth every 6 (six) hours as needed for moderate pain.   Yes [provider]  Ascorbic Acid (VITAMIN C) 100 MG tablet Take 100 mg by mouth daily.   Yes [provider]  baclofen 5 MG TABS Take 5 mg by mouth 2 (two) times daily. 03/13/20  Yes Kathie Dike, MD  calcium carbonate (OSCAL) 1500 (600 Ca) MG TABS tablet Take 1,500 mg by mouth daily with breakfast.   Yes [provider]  Cholecalciferol (D3) 50 MCG (2000 UT) TABS Take 2,000 Units by mouth daily.   Yes [provider]  Ibuprofen 200 MG CAPS Take 400 mg by mouth every 6 (six) hours as needed (pain).   Yes [provider]  imatinib (GLEEVEC) 400 MG tablet Take 400 mg by mouth daily. Take with meals and large glass  of water. Caution:Chemotherapy.   Yes [provider]  rosuvastatin (CRESTOR) 10 MG tablet Take 10 mg by mouth daily.   Yes [provider]    Allergies as of 02/05/2020 - Review Complete 01/17/2020  Allergen Reaction Noted  . Indocin [indomethacin]  01/17/2020  . Tolectin [tolmetin]  01/17/2020  . Wound dressing adhesive  01/17/2020  . Penicillins Swelling and Rash 01/17/2020    Family History  Problem Relation Age of Onset  . Stomach cancer Mother   . Lung cancer Brother   . Colon cancer Paternal Grandmother        in 82s    Social History   Socioeconomic History  . Marital status: Single    Spouse name: Not on file  . Number of children: Not on file  . Years of education: Not on file  . Highest education level: Not on file  Occupational History  . Occupation: retired  Tobacco Use  . Smoking status: Current Every Day Smoker    Packs/day: 1.00    Years: 47.00    Pack years: 47.00    Types: Cigarettes  . Smokeless tobacco: Never Used  Vaping Use  . Vaping Use: Never used  Substance and Sexual Activity  . Alcohol use: Yes    Comment: glass of wine once or twice a week  . Drug use: Never  . Sexual activity: Not on file  Other Topics Concern  . Not on file  Social History Narrative  . Not on file   Social Determinants of Health   Financial Resource Strain: Not on file  Food Insecurity: Not on file  Transportation Needs: No Transportation Needs  . Lack of Transportation (Medical): No  . Lack of Transportation (Non-Medical): No  Physical Activity: Inactive  . Days of Exercise per Week: 0 days  . Minutes of Exercise per Session: 0 min  Stress: Not on file  Social Connections: Not on file  Intimate Partner Violence: Not At Risk  . Fear of Current or Ex-Partner: No  . Emotionally Abused: No  . Physically Abused: No  . Sexually Abused: No    Review of Systems: See HPI, otherwise negative ROS  Physical Exam: BP 129/63   Pulse 68   Temp  98 F (36.7 C) (Oral)   Resp 18   Ht 4' 10"  (1.473 m)   Wt 75.4 kg   SpO2 97%   BMI 34.74 kg/m  General:   Alert,  Well-developed, well-nourished, pleasant and cooperative in NAD Mouth:  No deformity or lesions. Neck:  Supple; no masses or thyromegaly. No significant cervical adenopathy. Lungs:  Clear throughout to auscultation.   No wheezes, crackles, or rhonchi. No acute distress. Heart:  Regular rate and rhythm; no murmurs, clicks, rubs,  or gallops. Abdomen: Non-distended, normal bowel sounds.  Soft and nontender without appreciable mass or hepatosplenomegaly.  Pulses:  Normal pulses noted. Extremities:  Without clubbing or edema.  Impression/Plan: 75 year old lady with reported history of achalasia status post esophageal myotomy and antireflux procedure history of gastric partial gastric resection for GIST.  History multiple colonic polyps removed out-of-state few years ago.  Here for EGD with possible esophageal dilation as feasible/appropriate per plan and surveillance colonoscopy per plan. The risks, benefits, limitations, imponderables and  alternatives regarding both EGD and colonoscopy have been reviewed with the patient. Questions have been answered. All parties agreeable.      Notice: This dictation was prepared with Dragon dictation along with smaller phrase technology. Any transcriptional errors that result from this process are unintentional and may not be corrected upon review.

## 2020-04-02 NOTE — Discharge Instructions (Signed)
Colonoscopy Discharge Instructions  Read the instructions outlined below and refer to this sheet in the next few weeks. These discharge instructions provide you with general information on caring for yourself after you leave the hospital. Your doctor may also give you specific instructions. While your treatment has been planned according to the most current medical practices available, unavoidable complications occasionally occur. If you have any problems or questions after discharge, call Dr. Gala Romney at [phone]. ACTIVITY  You may resume your regular activity, but move at a slower pace for the next 24 hours.   Take frequent rest periods for the next 24 hours.   Walking will help get rid of the air and reduce the bloated feeling in your belly (abdomen).   No driving for 24 hours (because of the medicine (anesthesia) used during the test).    Do not sign any important legal documents or operate any machinery for 24 hours (because of the anesthesia used during the test).  NUTRITION  Drink plenty of fluids.   You may resume your normal diet as instructed by your doctor.   Begin with a light meal and progress to your normal diet. Heavy or fried foods are harder to digest and may make you feel sick to your stomach (nauseated).   Avoid alcoholic beverages for 24 hours or as instructed.  MEDICATIONS  You may resume your normal medications unless your doctor tells you otherwise.  WHAT YOU CAN EXPECT TODAY  Some feelings of bloating in the abdomen.   Passage of more gas than usual.   Spotting of blood in your stool or on the toilet paper.  IF YOU HAD POLYPS REMOVED DURING THE COLONOSCOPY:  No aspirin products for 7 days or as instructed.   No alcohol for 7 days or as instructed.   Eat a soft diet for the next 24 hours.  FINDING OUT THE RESULTS OF YOUR TEST Not all test results are available during your visit. If your test results are not back during the visit, make an appointment  with your caregiver to find out the results. Do not assume everything is normal if you have not heard from your caregiver or the medical facility. It is important for you to follow up on all of your test results.  SEEK IMMEDIATE MEDICAL ATTENTION IF:  You have more than a spotting of blood in your stool.   Your belly is swollen (abdominal distention).   You are nauseated or vomiting.   You have a temperature over 101.   You have abdominal pain or discomfort that is severe or gets worse throughout the day.    EGD Discharge instructions Please read the instructions outlined below and refer to this sheet in the next few weeks. These discharge instructions provide you with general information on caring for yourself after you leave the hospital. Your doctor may also give you specific instructions. While your treatment has been planned according to the most current medical practices available, unavoidable complications occasionally occur. If you have any problems or questions after discharge, please call your doctor. ACTIVITY  You may resume your regular activity but move at a slower pace for the next 24 hours.   Take frequent rest periods for the next 24 hours.   Walking will help expel (get rid of) the air and reduce the bloated feeling in your abdomen.   No driving for 24 hours (because of the anesthesia (medicine) used during the test).   You may shower.   Do not sign  any important legal documents or operate any machinery for 24 hours (because of the anesthesia used during the test).  NUTRITION  Drink plenty of fluids.   You may resume your normal diet.   Begin with a light meal and progress to your normal diet.   Avoid alcoholic beverages for 24 hours or as instructed by your caregiver.  MEDICATIONS  You may resume your normal medications unless your caregiver tells you otherwise.  WHAT YOU CAN EXPECT TODAY  You may experience abdominal discomfort such as a feeling of  fullness or "gas" pains.  FOLLOW-UP  Your doctor will discuss the results of your test with you.  SEEK IMMEDIATE MEDICAL ATTENTION IF ANY OF THE FOLLOWING OCCUR:  Excessive nausea (feeling sick to your stomach) and/or vomiting.   Severe abdominal pain and distention (swelling).   Trouble swallowing.   Temperature over 101 F (37.8 C).   Rectal bleeding or vomiting of blood.   Your esophagus was stretched today.  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 Normally, food travels down the esophagus and stays in the stomach to be digested. With GER, food and stomach acid sometimes move back up into the esophagus. You may have a disease called gastroesophageal reflux disease (GERD) if the reflux:  Happens often.  Causes frequent or very bad symptoms.  Causes problems such as damage to the esophagus. When this happens, the esophagus becomes sore and swollen. Over time, GERD can make small holes (ulcers) in the lining of the esophagus. What are the causes? This condition is caused by a problem with the muscle between the esophagus and the stomach. When this muscle is weak or not normal, it does not close properly to keep food and acid from coming back up from the stomach. The muscle can be weak because of:  Tobacco use.  Pregnancy.  Having a certain type of hernia (hiatal hernia).  Alcohol use.  Certain foods and drinks, such as coffee, chocolate, onions, and peppermint. What increases the risk?  Being  overweight.  Having a disease that affects your connective tissue.  Taking NSAIDs, such a ibuprofen. What are the signs or symptoms?  Heartburn.  Difficult or painful swallowing.  The feeling of having a lump in the throat.  A bitter taste in the mouth.  Bad breath.  Having a lot of saliva.  Having an upset or bloated stomach.  Burping.  Chest pain. Different conditions can cause chest pain. Make sure you see your doctor if you have chest pain.  Shortness of breath or wheezing.  A long-term cough or a cough at night.  Wearing away of the surface of teeth (tooth enamel).  Weight loss. How is this treated?  Making changes to your diet.  Taking medicine.  Having surgery. Treatment will depend on how bad your symptoms are. Follow these instructions at home: Eating and drinking  Follow a diet as told by your doctor. You may need to avoid foods and drinks such as: ? Coffee and tea, with or without caffeine. ? Drinks that  contain alcohol. ? Energy drinks and sports drinks. ? Bubbly (carbonated) drinks or sodas. ? Chocolate and cocoa. ? Peppermint and mint flavorings. ? Garlic and onions. ? Horseradish. ? Spicy and acidic foods. These include peppers, chili powder, curry powder, vinegar, hot sauces, and BBQ sauce. ? Citrus fruit juices and citrus fruits, such as oranges, lemons, and limes. ? Tomato-based foods. These include red sauce, chili, salsa, and pizza with red sauce. ? Fried and fatty foods. These include donuts, french fries, potato chips, and high-fat dressings. ? High-fat meats. These include hot dogs, rib eye steak, sausage, ham, and bacon. ? High-fat dairy items, such as whole milk, butter, and cream cheese.  Eat small meals often. Avoid eating large meals.  Avoid drinking large amounts of liquid with your meals.  Avoid eating meals during the 2-3 hours before bedtime.  Avoid lying down right after you eat.  Do not exercise right after you eat.    Lifestyle  Do not smoke or use any products that contain nicotine or tobacco. If you need help quitting, ask your doctor.  Try to lower your stress. If you need help doing this, ask your doctor.  If you are overweight, lose an amount of weight that is healthy for you. Ask your doctor about a safe weight loss goal.   General instructions  Pay attention to any changes in your symptoms.  Take over-the-counter and prescription medicines only as told by your doctor.  Do not take aspirin, ibuprofen, or other NSAIDs unless your doctor says it is okay.  Wear loose clothes. Do not wear anything tight around your waist.  Raise (elevate) the head of your bed about 6 inches (15 cm). You may need to use a wedge to do this.  Avoid bending over if this makes your symptoms worse.  Keep all follow-up visits. Contact a doctor if:  You have new symptoms.  You lose weight and you do not know why.  You have trouble swallowing or it hurts to swallow.  You have wheezing or a cough that keeps happening.  You have a hoarse voice.  Your symptoms do not get better with treatment. Get help right away if:  You have sudden pain in your arms, neck, jaw, teeth, or back.  You suddenly feel sweaty, dizzy, or light-headed.  You have chest pain or shortness of breath.  You vomit and the vomit is green, yellow, or black, or it looks like blood or coffee grounds.  You faint.  Your poop (stool) is red, bloody, or black.  You cannot swallow, drink, or eat. These symptoms may represent a serious problem that is an emergency. Do not wait to see if the symptoms will go away. Get medical help right away. Call your local emergency services (911 in the [place] Do not drive yourself to the hospital. Summary  If a person has gastroesophageal reflux disease (GERD), food and stomach acid move back up into the esophagus and cause symptoms or problems such as damage to the esophagus.  Treatment will depend on  how bad your symptoms are.  Follow a diet as told by your doctor.  Take all medicines only as told by your doctor. This information is not intended to replace advice given to you by your health care provider. Make sure you discuss any questions you have with your health care provider. Document Revised: 07/08/2019 Document Reviewed: 07/08/2019 Elsevier Patient Education  [place].   Colon Polyps  Colon polyps are tissue growths inside the  colon, which is part of the large intestine. They are one of the types of polyps that can grow in the body. A polyp may be a round bump or a mushroom-shaped growth. You could have one polyp or more than one. Most colon polyps are noncancerous (benign). However, some colon polyps can become cancerous over time. Finding and removing the polyps early can help prevent this. What are the causes? The exact cause of colon polyps is not known. What increases the risk? The following factors may make you more likely to develop this condition:  Having a family history of colorectal cancer or colon polyps.  Being older than 74 years of age.  Being younger than 74 years of age and having a significant family history of colorectal cancer or colon polyps or a genetic condition that puts you at higher risk of getting colon polyps.  Having inflammatory bowel disease, such as ulcerative colitis or Crohn's disease.  Having certain conditions passed from parent to child (hereditary conditions), such as: ? Familial adenomatous polyposis (FAP). ? Lynch syndrome. ? Turcot syndrome. ? Peutz-Jeghers syndrome. ? MUTYH-associated polyposis (MAP).  Being overweight.  Certain lifestyle factors. These include smoking cigarettes, drinking too much alcohol, not getting enough exercise, and eating a diet that is high in fat and red meat and low in fiber.  Having had childhood cancer that was treated with radiation of the abdomen. What are the signs or symptoms? Many  times, there are no symptoms. If you have symptoms, they may include:  Blood coming from the rectum during a bowel movement.  Blood in the stool (feces). The blood may be bright red or very dark in color.  Pain in the abdomen.  A change in bowel habits, such as constipation or diarrhea. How is this diagnosed? This condition is diagnosed with a colonoscopy. This is a procedure in which a lighted, flexible scope is inserted into the opening between the buttocks (anus) and then passed into the colon to examine the area. Polyps are sometimes found when a colonoscopy is done as part of routine cancer screening tests. How is this treated? This condition is treated by removing any polyps that are found. Most polyps can be removed during a colonoscopy. Those polyps will then be tested for cancer. Additional treatment may be needed depending on the results of testing. Follow these instructions at home: Eating and drinking  Eat foods that are high in fiber, such as fruits, vegetables, and whole grains.  Eat foods that are high in calcium and vitamin D, such as milk, cheese, yogurt, eggs, liver, fish, and broccoli.  Limit foods that are high in fat, such as fried foods and desserts.  Limit the amount of red meat, precooked or cured meat, or other processed meat that you eat, such as hot dogs, sausages, bacon, or meat loaves.  Limit sugary drinks.   Lifestyle  Maintain a healthy weight, or lose weight if recommended by your health care provider.  Exercise every day or as told by your health care provider.  Do not use any products that contain nicotine or tobacco, such as cigarettes, e-cigarettes, and chewing tobacco. If you need help quitting, ask your health care provider.  Do not drink alcohol if: ? Your health care provider tells you not to drink. ? You are pregnant, may be pregnant, or are planning to become pregnant.  If you drink alcohol: ? Limit how much you use to:  0-1 drink a  day for women.  0-2 drinks  a day for men. ? Know how much alcohol is in your drink. In the [place] one drink equals one 12 oz bottle of beer (355 mL), one 5 oz glass of wine (148 mL), or one 1 oz glass of hard liquor (44 mL). General instructions  Take over-the-counter and prescription medicines only as told by your health care provider.  Keep all follow-up visits. This is important. This includes having regularly scheduled colonoscopies. Talk to your health care provider about when you need a colonoscopy. Contact a health care provider if:  You have new or worsening bleeding during a bowel movement.  You have new or increased blood in your stool.  You have a change in bowel habits.  You lose weight for no known reason. Summary  Colon polyps are tissue growths inside the colon, which is part of the large intestine. They are one type of polyp that can grow in the body.  Most colon polyps are noncancerous (benign), but some can become cancerous over time.  This condition is diagnosed with a colonoscopy.  This condition is treated by removing any polyps that are found. Most polyps can be removed during a colonoscopy. This information is not intended to replace advice given to you by your health care provider. Make sure you discuss any questions you have with your health care provider. Document Revised: 04/17/2019 Document Reviewed: 04/17/2019 Elsevier Patient Education  2021 [place].  Diverticulosis  Diverticulosis is a condition that develops when small pouches (diverticula) form in the wall of the large intestine (colon). The colon is where water is absorbed and stool (feces) is formed. The pouches form when the inside layer of the colon pushes through weak spots in the outer layers of the colon. You may have a few pouches or many of them. The pouches usually do not cause problems unless they become inflamed or infected. When this happens, the condition is called  diverticulitis. What are the causes? The cause of this condition is not known. What increases the risk? The following factors may make you more likely to develop this condition:  Being older than age 6. Your risk for this condition increases with age. Diverticulosis is rare among people younger than age 25. By age 66, many people have it.  Eating a low-fiber diet.  Having frequent constipation.  Being overweight.  Not getting enough exercise.  Smoking.  Taking over-the-counter pain medicines, like aspirin and ibuprofen.  Having a family history of diverticulosis. What are the signs or symptoms? In most people, there are no symptoms of this condition. If you do have symptoms, they may include:  Bloating.  Cramps in the abdomen.  Constipation or diarrhea.  Pain in the lower left side of the abdomen. How is this diagnosed? Because diverticulosis usually has no symptoms, it is most often diagnosed during an exam for other colon problems. The condition may be diagnosed by:  Using a flexible scope to examine the colon (colonoscopy).  Taking an X-ray of the colon after dye has been put into the colon (barium enema).  Having a CT scan. How is this treated? You may not need treatment for this condition. Your health care provider may recommend treatment to prevent problems. You may need treatment if you have symptoms or if you previously had diverticulitis. Treatment may include:  Eating a high-fiber diet.  Taking a fiber supplement.  Taking a live bacteria supplement (probiotic).  Taking medicine to relax your colon.   Follow these instructions at home: Medicines  Take over-the-counter and prescription medicines only as told by your health care provider.  If told by your health care provider, take a fiber supplement or probiotic. Constipation prevention Your condition may cause constipation. To prevent or treat constipation, you may need to:  Drink enough fluid to  keep your urine pale yellow.  Take over-the-counter or prescription medicines.  Eat foods that are high in fiber, such as beans, whole grains, and fresh fruits and vegetables.  Limit foods that are high in fat and processed sugars, such as fried or sweet foods.   General instructions  Try not to strain when you have a bowel movement.  Keep all follow-up visits as told by your health care provider. This is important. Contact a health care provider if you:  Have pain in your abdomen.  Have bloating.  Have cramps.  Have not had a bowel movement in 3 days. Get help right away if:  Your pain gets worse.  Your bloating becomes very bad.  You have a fever or chills, and your symptoms suddenly get worse.  You vomit.  You have bowel movements that are bloody or black.  You have bleeding from your rectum. Summary  Diverticulosis is a condition that develops when small pouches (diverticula) form in the wall of the large intestine (colon).  You may have a few pouches or many of them.  This condition is most often diagnosed during an exam for other colon problems.  Treatment may include increasing the fiber in your diet, taking supplements, or taking medicines. This information is not intended to replace advice given to you by your health care provider. Make sure you discuss any questions you have with your health care provider. Document Revised: 07/26/2018 Document Reviewed: 07/26/2018 Elsevier Patient Education  [place].

## 2020-04-02 NOTE — Op Note (Signed)
[facility] Patient Name: Cassandra Fowler Procedure Date: 04/02/2020 7:06 AM MRN: [id number] Date of Birth: 02-Nov-1945 Attending MD: Norvel Richards , MD CSN: [id number] Age: 75 Admit Type: Outpatient Procedure:                Upper GI endoscopy Indications:              Dysphagia Providers:                Norvel Richards, MD, Gwenlyn Fudge, RN, Aram Candela Referring MD:              Medicines:                Propofol per Anesthesia Complications:            No immediate complications. Estimated Blood Loss:     Estimated blood loss: none. Procedure:                Pre-Anesthesia Assessment:                           - Prior to the procedure, a History and Physical                            was performed, and patient medications and                            allergies were reviewed. The patient's tolerance of                            previous anesthesia was also reviewed. The risks                            and benefits of the procedure and the sedation                            options and risks were discussed with the patient.                            All questions were answered, and informed consent                            was obtained. Prior Anticoagulants: The patient has                            taken no previous anticoagulant or antiplatelet                            agents. ASA Grade Assessment: III - A patient with                            severe systemic disease. After reviewing the risks  and benefits, the patient was deemed in                            satisfactory condition to undergo the procedure.                           After obtaining informed consent, the endoscope was                            passed under direct vision. Throughout the                            procedure, the patient's blood pressure, pulse, and                            oxygen saturations were monitored  continuously. The                            GIF-H190 ([id number]) scope was introduced through the                            mouth, and advanced to the second part of duodenum.                            The upper GI endoscopy was accomplished without                            difficulty. The patient tolerated the procedure                            well. Scope In: 7:46:29 AM Scope Out: 7:55:19 AM Total Procedure Duration: 0 hours 8 minutes 50 seconds  Findings:      5 x 8 mm erosion straddling the GE junction. No Barrett's epithelium       seen. No nodularity. Esophagus was patent throughout its course in fact       the distal third of the esophagus was somewhat dilated.      Examination of gastric mucosa reveal no abnormality retroflexed view the       proximal stomach and EG junction demonstrated surgical changes. Very       minimal stigmata of prior antireflux surgery, floppy, likely       ineffective. No evidence of residual/recurrent tumor      The duodenal bulb and second portion of the duodenum were normal. Scope       withdrawn, 56 French Maloney dilators passed full insertion easily.       Subsequently, 100 French Maloney dilator was passed without resistance.       Finally, 60 French Maloney dilator is passed to full insertion without       resistance. A look back revealed no apparent complication of these       maneuvers. There was no bleeding. Impression:               -Erosive reflux esophagitis. Somewhat dilated  distal esophagus. Status post [facility] dilation.                            Stigmata of prior gastric surgery as described. 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 Moderate Sedation:      Moderate (conscious) sedation was personally administered by an       anesthesia professional. The following parameters were monitored:  oxygen       saturation, heart rate, blood pressure, respiratory rate, EKG, adequacy       of pulmonary ventilation, and response to care.      Moderate (conscious) sedation was personally administered by an       anesthesia professional. The following parameters were monitored: oxygen       saturation, heart rate, blood pressure, respiratory rate, EKG, adequacy       of pulmonary ventilation, and response to care. Recommendation:           -                           - Advance diet as tolerated. Protonix 40 mg once                            daily. Office follow-up with [place] in 6 weeks                           See colonoscopy report Procedure Code(s):        --- Professional ---                           [phone], Esophagogastroduodenoscopy, flexible,                            transoral; with biopsy, single or multiple Diagnosis Code(s):        --- Professional ---                           R13.10, Dysphagia, unspecified CPT copyright 2019 American Medical Association. All rights reserved. The codes documented in this report are preliminary and upon coder review may  be revised to meet current compliance requirements. Cristopher Estimable. Tameka Hoiland, MD Norvel Richards, MD 04/02/2020 8:33:00 AM This report has been signed electronically. Number of Addenda: 0

## 2020-04-02 NOTE — Transfer of Care (Signed)
Immediate Anesthesia Transfer of Care Note  Patient: Cassandra Fowler  Procedure(s) Performed: COLONOSCOPY WITH PROPOFOL (N/A ) ESOPHAGOGASTRODUODENOSCOPY (EGD) WITH PROPOFOL (N/A ) MALONEY DILATION (N/A ) POLYPECTOMY  Patient Location: PACU  Anesthesia Type:General  Level of Consciousness: awake and patient cooperative  Airway & Oxygen Therapy: Patient Spontanous Breathing  Post-op Assessment: Report given to RN, Post -op Vital signs reviewed and stable and Patient moving all extremities  Post vital signs: Reviewed and stable  Last Vitals:  Vitals Value Taken Time  BP 108/60 04/02/20 0829  Temp 36.5 C 04/02/20 0829  Pulse 70 04/02/20 0829  Resp 16 04/02/20 0829  SpO2 95 % 04/02/20 0829    Last Pain:  Vitals:   04/02/20 0829  TempSrc: Oral  PainSc: 0-No pain      Patients Stated Pain Goal: 5 (01/64/29 0379)  Complications: No complications documented.

## 2020-04-03 LAB — SURGICAL PATHOLOGY

## 2020-04-06 ENCOUNTER — Encounter: Payer: Self-pay | Admitting: Internal Medicine

## 2020-04-06 ENCOUNTER — Ambulatory Visit (HOSPITAL_COMMUNITY): Payer: Medicare Other | Admitting: Hematology

## 2020-04-08 ENCOUNTER — Other Ambulatory Visit (HOSPITAL_COMMUNITY): Payer: Medicare Other

## 2020-04-09 ENCOUNTER — Encounter (HOSPITAL_COMMUNITY): Payer: Self-pay | Admitting: Internal Medicine

## 2020-04-10 ENCOUNTER — Ambulatory Visit (HOSPITAL_COMMUNITY): Payer: Medicare Other

## 2020-04-15 ENCOUNTER — Ambulatory Visit (HOSPITAL_COMMUNITY): Payer: Medicare Other | Admitting: Hematology

## 2020-04-22 ENCOUNTER — Other Ambulatory Visit (HOSPITAL_COMMUNITY): Payer: Self-pay | Admitting: *Deleted

## 2020-04-22 DIAGNOSIS — Z8509 Personal history of malignant neoplasm of other digestive organs: Secondary | ICD-10-CM

## 2020-04-23 ENCOUNTER — Other Ambulatory Visit: Payer: Self-pay

## 2020-04-23 ENCOUNTER — Inpatient Hospital Stay (HOSPITAL_COMMUNITY): Payer: Medicare Other

## 2020-04-23 ENCOUNTER — Ambulatory Visit (HOSPITAL_COMMUNITY): Payer: Medicare Other | Admitting: Hematology

## 2020-04-23 ENCOUNTER — Inpatient Hospital Stay (HOSPITAL_COMMUNITY): Payer: Medicare Other | Attending: Hematology | Admitting: Genetic Counselor

## 2020-04-23 DIAGNOSIS — Z8509 Personal history of malignant neoplasm of other digestive organs: Secondary | ICD-10-CM

## 2020-04-23 DIAGNOSIS — E785 Hyperlipidemia, unspecified: Secondary | ICD-10-CM | POA: Diagnosis not present

## 2020-04-23 DIAGNOSIS — Z79899 Other long term (current) drug therapy: Secondary | ICD-10-CM | POA: Diagnosis not present

## 2020-04-23 DIAGNOSIS — F17210 Nicotine dependence, cigarettes, uncomplicated: Secondary | ICD-10-CM | POA: Insufficient documentation

## 2020-04-23 DIAGNOSIS — Z803 Family history of malignant neoplasm of breast: Secondary | ICD-10-CM | POA: Insufficient documentation

## 2020-04-23 DIAGNOSIS — C49A4 Gastrointestinal stromal tumor of large intestine: Secondary | ICD-10-CM | POA: Diagnosis not present

## 2020-04-23 DIAGNOSIS — Z800 Family history of malignant neoplasm of digestive organs: Secondary | ICD-10-CM

## 2020-04-23 DIAGNOSIS — Z801 Family history of malignant neoplasm of trachea, bronchus and lung: Secondary | ICD-10-CM | POA: Diagnosis not present

## 2020-04-23 DIAGNOSIS — Z8049 Family history of malignant neoplasm of other genital organs: Secondary | ICD-10-CM | POA: Insufficient documentation

## 2020-04-23 LAB — CBC WITH DIFFERENTIAL/PLATELET
Abs Immature Granulocytes: 0.06 10*3/uL (ref 0.00–0.07)
Basophils Absolute: 0 10*3/uL (ref 0.0–0.1)
Basophils Relative: 0 %
Eosinophils Absolute: 0.2 10*3/uL (ref 0.0–0.5)
Eosinophils Relative: 1 %
HCT: 40.3 % (ref 36.0–46.0)
Hemoglobin: 13.4 g/dL (ref 12.0–15.0)
Immature Granulocytes: 0 %
Lymphocytes Relative: 13 %
Lymphs Abs: 1.9 10*3/uL (ref 0.7–4.0)
MCH: 33.3 pg (ref 26.0–34.0)
MCHC: 33.3 g/dL (ref 30.0–36.0)
MCV: 100.2 fL — ABNORMAL HIGH (ref 80.0–100.0)
Monocytes Absolute: 1.2 10*3/uL — ABNORMAL HIGH (ref 0.1–1.0)
Monocytes Relative: 8 %
Neutro Abs: 11.9 10*3/uL — ABNORMAL HIGH (ref 1.7–7.7)
Neutrophils Relative %: 78 %
Platelets: 261 10*3/uL (ref 150–400)
RBC: 4.02 MIL/uL (ref 3.87–5.11)
RDW: 14.3 % (ref 11.5–15.5)
WBC: 15.3 10*3/uL — ABNORMAL HIGH (ref 4.0–10.5)
nRBC: 0 % (ref 0.0–0.2)

## 2020-04-23 LAB — COMPREHENSIVE METABOLIC PANEL
ALT: 33 U/L (ref 0–44)
AST: 20 U/L (ref 15–41)
Albumin: 4 g/dL (ref 3.5–5.0)
Alkaline Phosphatase: 59 U/L (ref 38–126)
Anion gap: 8 (ref 5–15)
BUN: 14 mg/dL (ref 8–23)
CO2: 29 mmol/L (ref 22–32)
Calcium: 9.5 mg/dL (ref 8.9–10.3)
Chloride: 105 mmol/L (ref 98–111)
Creatinine, Ser: 0.74 mg/dL (ref 0.44–1.00)
GFR, Estimated: >60 mL/min (ref >60)
Glucose, Bld: 78 mg/dL (ref 70–99)
Potassium: 3.7 mmol/L (ref 3.5–5.1)
Sodium: 142 mmol/L (ref 135–145)
Total Bilirubin: 0.8 mg/dL (ref 0.3–1.2)
Total Protein: 6.7 g/dL (ref 6.5–8.1)

## 2020-04-24 ENCOUNTER — Encounter (HOSPITAL_COMMUNITY): Payer: Self-pay | Admitting: Genetic Counselor

## 2020-04-24 DIAGNOSIS — Z801 Family history of malignant neoplasm of trachea, bronchus and lung: Secondary | ICD-10-CM | POA: Insufficient documentation

## 2020-04-24 DIAGNOSIS — Z803 Family history of malignant neoplasm of breast: Secondary | ICD-10-CM | POA: Insufficient documentation

## 2020-04-24 DIAGNOSIS — Z800 Family history of malignant neoplasm of digestive organs: Secondary | ICD-10-CM | POA: Insufficient documentation

## 2020-04-24 DIAGNOSIS — Z8049 Family history of malignant neoplasm of other genital organs: Secondary | ICD-10-CM | POA: Insufficient documentation

## 2020-04-24 NOTE — Progress Notes (Signed)
REFERRING PROVIDER: Derek Jack, MD [address] [place],  [place] [address]  PRIMARY PROVIDER:  Leonie Douglas, MD  PRIMARY REASON FOR VISIT:  1. GIST (gastrointestinal stroma tumor), malignant, colon ([place])   2. Family history of breast cancer   3. Family history of colon cancer   4. Family history of uterine cancer   5. Family history of lung cancer      I connected with Cassandra Fowler on 04/23/2020 at 10:00 am EDT by video conference and verified that I am speaking with the correct person using two identifiers.   Patient location: [place] Provider location: [place]  HISTORY OF PRESENT ILLNESS:   Cassandra Fowler, a 75 y.o. female, was seen for a [place] cancer genetics consultation at the request of Dr. Delton Coombes due to a personal and family history of cancer.  Cassandra Fowler presents to clinic today to discuss the possibility of a hereditary predisposition to cancer, genetic testing, and to further clarify her future cancer risks, as well as potential cancer risks for family members.   In June of 2021, at the age of 35, Cassandra Fowler was diagnosed with multifocal gastric gastrointestinal stromal tumors (GIST). The treatment plan included partial gastrectomy and chemotherapy.   RISK FACTORS:  Menarche was at age 76.  First live birth at age 82.  OCP use for approximately 1-2 months.  Ovaries intact: yes.  Hysterectomy: yes.  Menopausal status: postmenopausal.  HRT use: 0 years. Colonoscopy: yes; 2022 (polyps present). Mammogram within the last year: yes. Number of breast biopsies: 3 (hx of atypical ductal hyperplasia). Any excessive radiation exposure in the past: no.  Past Medical History:  Diagnosis Date  . Achalasia   . Arthritis   . Family history of breast cancer   . Family history of colon cancer   . Family history of lung cancer   . Family history of uterine cancer   . GERD (gastroesophageal reflux disease)   . GIST  (gastrointestinal stroma tumor), malignant, colon ([place] 04/24/2019   s/p resection of 2 tumors in June 2021 at [facility] in [place]  . HLD (hyperlipidemia)     Past Surgical History:  Procedure Laterality Date  . BREAST BIOPSY Right 09/06/2019   [facility]; evaluation of suspicious calcification of right breast; pathology with atypical ductal hyperplasia, focal cystic dilation of ducts with columnar cell change of epithelium, presence of microcalcifications.  Marland Kitchen CATARACT EXTRACTION Right 03/28/2017   [facility]  . CATARACT EXTRACTION Left 03/14/2017   [facility]  . COLONOSCOPY  01/28/2013   Dr. Ok Edwards, [place], [place]; Hyperplastic polyp x2, sessile serrated adenoma x2, tubular adenoma with low-grade dysplasia x1.  Recommend repeat colonoscopy in 3 years.  . COLONOSCOPY WITH PROPOFOL N/A 04/02/2020   Procedure: COLONOSCOPY WITH PROPOFOL;  Surgeon: Daneil Dolin, MD;  [facility];  Service: Endoscopy;  Laterality: N/A;  AM appt  . ESOPHAGOGASTRODUODENOSCOPY  04/24/2019   [place]; 2.4 cm gastric submucosal mass in the fundus consistent with GIST.  FNA consistent with GIST.  Marland Kitchen ESOPHAGOGASTRODUODENOSCOPY  01/28/2013   Dr. Gwen Her Devers in [place]; normal esophagus, hernia at GE junction, normal examined stomach, normal examined duodenum.  Does not appear dilation was performed.  . ESOPHAGOGASTRODUODENOSCOPY (EGD) WITH PROPOFOL N/A 04/02/2020   Procedure: ESOPHAGOGASTRODUODENOSCOPY (EGD) WITH PROPOFOL;  Surgeon: Daneil Dolin, MD;  [facility];  Service: Endoscopy;  Laterality: N/A;  . GIST tumor resection  06/19/2019  [facility], robotic assisted laparoscopic partial gastrectomy x2.  Pathology consistent with GIST  . HELLER MYOTOMY  06/19/2019   [facility]; esophageal myotomy with hiatal hernia repair and dor fundoplication  . left hand surgery    . MALONEY  DILATION N/A 04/02/2020   Procedure: Venia Minks DILATION;  Surgeon: Daneil Dolin, MD;  Location: [facility];  Service: Endoscopy;  Laterality: N/A;  . POLYPECTOMY  04/02/2020   Procedure: POLYPECTOMY;  Surgeon: Daneil Dolin, MD;  Location[facility];  Service: Endoscopy;;  . tubal ligation    . UMBILICAL HERNIA REPAIR     1970s  . UPPER ESOPHAGEAL ENDOSCOPIC ULTRASOUND (EUS)  04/24/2019   [facility]; endoscopic findings: Tortuous esophagus s/p balloon dilation to 18 mm with no effect s/p biopsy, submucosal mass in the fundus, normal duodenum; endosonographic findings: 2.4 cm x 2.4 cm submucosal mass in the fundus s/p FNA, cholelithiasis, no hepatic, CBD, or pancreatic abnormalities.  Pathology: GIST, hepatic parenchyma with mild steato-fibrosis, reflux esophagitis  . YAG laser posterior capsulotomy Left 08/30/2018   [facility], left eye.    Social History   Socioeconomic History  . Marital status: Single    Spouse name: Not on file  . Number of children: Not on file  . Years of education: Not on file  . Highest education level: Not on file  Occupational History  . Occupation: retired  Tobacco Use  . Smoking status: Current Every Day Smoker    Packs/day: 1.00    Years: 47.00    Pack years: 47.00    Types: Cigarettes  . Smokeless tobacco: Never Used  Vaping Use  . Vaping Use: Never used  Substance and Sexual Activity  . Alcohol use: Yes    Comment: glass of wine once or twice a week  . Drug use: Never  . Sexual activity: Not on file  Other Topics Concern  . Not on file  Social History Narrative  . Not on file   Social Determinants of Health   Financial Resource Strain: Not on file  Food Insecurity: Not on file  Transportation Needs: No Transportation Needs  . Lack of Transportation (Medical): No  . Lack of Transportation (Non-Medical): No  Physical Activity: Inactive  . Days of Exercise per Week: 0 days  . Minutes of  Exercise per Session: 0 min  Stress: Not on file  Social Connections: Not on file     FAMILY HISTORY:  We obtained a detailed, 4-generation family history.  Significant diagnoses are listed below: Family History  Problem Relation Age of Onset  . Colon cancer Mother        d.94  . Lung cancer Brother 21       smoker  . Colon cancer Paternal Grandmother        dx in her 5s  . Breast cancer Maternal Aunt        unknown age of diagnosis  . Vaginal cancer Daughter        dx <53  . Uterine cancer Daughter        dx <53  . Breast cancer Daughter        dx <53  . Breast cancer Maternal Aunt        unknown age of diagnosis  . Breast cancer Cousin        unknown age of diagnosis (maternal first cousin)  . Breast cancer Cousin        unknown age of diagnosis (maternal  first cousin)   Cassandra Fowler has one daughter (age 31) who has had vaginal cancer, uterine cancer, and breast cancer all diagnosed in the past 10 years. She had two brothers. One died from lung cancer at age 55 and was a smoker.  Cassandra Fowler mother died at age 15 with colon cancer. There were two maternal aunts. Both aunts had breast cancer (unknown ages of diagnosis). Two maternal first cousins had breast cancer (unknown ages of diagnosis). Cassandra Fowler maternal grandmother died at an unknown age without cancer. Her maternal grandfather died in his late 61s without cancer.  Cassandra Fowler father died at age 54 without cancer. There was one paternal aunt, and Cassandra Fowler has limited information about this relative. Cassandra Fowler paternal grandmother died at age 76 from colon cancer (diagnosed in her 76s). Her paternal grandfather died younger than 50 without cancer.   Cassandra Fowler is unaware of previous family history of genetic testing for hereditary cancer risks. Patient's maternal ancestors are of unknown descent, and paternal ancestors are of [place] and [place] descent. There is no reported Ashkenazi Jewish  ancestry. There is no known consanguinity.  GENETIC COUNSELING ASSESSMENT: Cassandra Fowler is a 75 y.o. female with a personal history of multifocal GIST and a family history of breast cancer, colon cancer, uterine cancer, vaginal cancer, and lung cancer, which is somewhat suggestive of a hereditary cancer syndrome and predisposition to cancer. We, therefore, discussed and recommended the following at today's visit.   DISCUSSION: We discussed that approximately 5-10% of cancer is hereditary, with most cases of hereditary breast cancer associated with the BRCA1 and BRCA2 genes. There are other genes that can be associated with hereditary breast cancer syndromes, including ATM, CHEK2, PALB2, etc. Hereditary colon cancer and uterine cancer syndromes are most often associated with Lynch syndrome. Additionally, hereditary cases of GIST have been associated with mutations in the SDHx, NF1, PDGFRA, and KIT genes. We discussed that testing is beneficial for several reasons, including knowing about other cancer risks, identifying potential screening and risk-reduction options that may be appropriate, and to understand if other family members could be at risk for cancer and allow them to undergo genetic testing.   We reviewed the characteristics, features and inheritance patterns of hereditary cancer syndromes. We also discussed genetic testing, including the appropriate family members to test, the process of testing, insurance coverage and turn-around-time for results. We discussed the implications of a negative, positive and/or variant of uncertain significant result. We recommended Cassandra Fowler pursue genetic testing for the [facility] Multi-Cancer + RNA gene panel.   The Multi-Cancer + RNA Panel offered by [facility] includes sequencing and/or deletion/duplication analysis of the following 84 genes:  AIP*, ALK, APC*, ATM*, AXIN2*, BAP1*, BARD1*, BLM*, BMPR1A*, BRCA1*, BRCA2*, BRIP1*, CASR, CDC73*, CDH1*, CDK4, CDKN1B*,  CDKN1C*, CDKN2A, CEBPA, CHEK2*, CTNNA1*, DICER1*, DIS3L2*, EGFR, EPCAM, FH*, FLCN*, GATA2*, GPC3, GREM1, HOXB13, HRAS, KIT, MAX*, MEN1*, MET, MITF, MLH1*, MSH2*, MSH3*, MSH6*, MUTYH*, NBN*, NF1*, NF2*, NTHL1*, PALB2*, PDGFRA, PHOX2B, PMS2*, POLD1*, POLE*, POT1*, PRKAR1A*, PTCH1*, PTEN*, RAD50*, RAD51C*, RAD51D*, RB1*, RECQL4, RET, RUNX1*, SDHA*, SDHAF2*, SDHB*, SDHC*, SDHD*, SMAD4*, SMARCA4*, SMARCB1*, SMARCE1*, STK11*, SUFU*, TERC, TERT, TMEM127*, Tp53*, TSC1*, TSC2*, VHL*, WRN*, and WT1.  RNA analysis is performed for * genes.   Based on Cassandra Fowler's personal and family history of cancer, she meets medical criteria for genetic testing. Despite that she meets criteria, there may still be an out of pocket cost.  PLAN: After considering the risks, benefits, and limitations, Cassandra Fowler provided informed consent  to pursue genetic testing and the blood sample was sent to [facility] for analysis of the Multi-Cancer + RNA panel. Results should be available within approximately two-three weeks' time, at which point they will be disclosed by telephone to Cassandra Fowler, as will any additional recommendations warranted by these results. Cassandra Fowler will receive a summary of her genetic counseling visit and a copy of her results once available. This information will also be available in Epic.   Ms. Justiniano's questions were answered to her satisfaction today. Our contact information was provided should additional questions or concerns arise. Thank you for the referral and allowing [place] to share in the care of your patient.   Clint Guy, [place], [facility] Licensed, Certified Dispensing [email or web address] Phone: [phone]  The patient was seen for a total of 40 minutes in face-to-face genetic counseling.  This patient was discussed with Drs. Magrinat, Lindi Adie and/or Burr Medico who agrees with the above.    _______________________________________________________________________ For  Office Staff:  Number of people involved in session: 1 Was an Intern/ student involved with case: no

## 2020-05-08 ENCOUNTER — Ambulatory Visit (HOSPITAL_COMMUNITY): Payer: Medicare Other

## 2020-05-09 DIAGNOSIS — Z1379 Encounter for other screening for genetic and chromosomal anomalies: Secondary | ICD-10-CM | POA: Insufficient documentation

## 2020-05-09 HISTORY — DX: Encounter for other screening for genetic and chromosomal anomalies: Z13.79

## 2020-05-11 ENCOUNTER — Telehealth: Payer: Self-pay | Admitting: Genetic Counselor

## 2020-05-11 ENCOUNTER — Ambulatory Visit: Payer: Self-pay | Admitting: Genetic Counselor

## 2020-05-11 ENCOUNTER — Encounter: Payer: Self-pay | Admitting: Genetic Counselor

## 2020-05-11 DIAGNOSIS — Z1379 Encounter for other screening for genetic and chromosomal anomalies: Secondary | ICD-10-CM

## 2020-05-11 NOTE — Progress Notes (Signed)
Referring Provider: Leonie Douglas, MD Primary Care Physician:  Leonie Douglas, MD Primary GI Physician: Dr. Gala Romney  Chief Complaint  Patient presents with  . Dysphagia    Sometimes has to regurgitate  . Gastroesophageal Reflux    occ    HPI:   Cassandra Fowler is a 75 y.o. female presenting today for follow-up.  History of colon polyps, GIST gastric tumor s/p resection x2 in June 2021 in [place], now following with oncology locally with plans for CT CAP with contrast for restaging on 05/20/20.  Also with history of achalasia s/p Heller myotomy and hiatal hernia repair with fundoplication in [place] in June 2021. BPE January 2022 with slight dilation of distal esophagus just above the GE junction potentially related to prior myotomy and achalasia, no mass or stricture, diffuse dysmotility. Dr. Gala Romney offered EGD with large bore dilation and referral to [facility] for management of dysphagia/achalasia.  EGD 04/02/20: Erosive reflux esophagitis, somewhat dilated distal esophagus s/p 60 French Maloney dilation, stigmata of prior gastric surgery, no evidence of recurrent/persisting GIST, normal examined duodenum.  Recommended PPI daily. Colonoscopy 04/02/20: Diverticulosis in the sigmoid and descending colon, cecal AVM, three 5-8 millimeters polyps in the rectum, mid rectum, and ascending colon resected and retrieved.  Pathology with 2 tubular adenomas, 1 hyperplastic polyp.  Recommended repeat colonoscopy in 5 years if health permits.  Also with history of steatohepatitis/static fibrosis noted on GIST tumor biopsies in 2021 as these biopsies also contained cores of hepatic parenchyma. Abdominal ultrasound with elastography 02/04/2020 with diffusely increased parenchymal echogenicity.  kPa 1.6.  Also a 6 mm echogenic focus within the gallbladder which could represent adherent gallbladder wall adenomyomatosis or a gallbladder polyp.  Recommended ultrasound in 6 months for surveillance. She is  on recall.   Patient was admitted 03/10/20-03/13/20 after presenting for right flank pain.  She reported right flank pain x1 year, but the day she presented to the emergency room, it caused sharp pain radiating to her umbilicus, and she had difficulty standing.  Not affected by meals.  No nausea, vomiting, diarrhea.  She is noted to have dilated CBD, LFTs normal. MRI abdomen without obvious filling defect.  We recommended repeating LFTs in 2 to 3 months for surveillance.  Regarding right flank pain, etiology was unclear.  Suspected neuropathic pain.  MRI of thoracic spine was unrevealing.  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  Case was discussed with neurosurgery who recommended following up in the clinic.  She received baclofen and reported an improvement in her symptoms.  Today:  Dysphagia: Mild improvement with dilation. Continues with intermittent symptoms. Occurs with solids and liquids. Occasional regurgitation.   GERD: Fairly well controlled on Pantoprazole. Breakthrough once a week to every 2 weeks. Takes tums or pepto bismol as needed.   No nausea, vomiting, abdominal pain, constipation, or diarrhea. Takes Equate colon health once a day. No brbpr or melena.   Back pain is improving. She is receiving injections and going to PT.   Dilated CBD: Most recent LFTs 04/23/2020 within normal limits.   Fatty liver: Has not really worked on her diet very much.  Still eats fried/fatty foods and sweets routinely.  Past Medical History:  Diagnosis Date  . Achalasia   . Arthritis   . Family history of breast cancer   . Family history of colon cancer   . Family history of lung cancer   . Family history  of uterine cancer   . GERD (gastroesophageal reflux disease)   . GIST (gastrointestinal stroma tumor), malignant, colon ([place]) 04/24/2019   s/p resection of 2 tumors in June 2021 at [facility] in  [place]  . HLD (hyperlipidemia)     Past Surgical History:  Procedure Laterality Date  . BREAST BIOPSY Right 09/06/2019   [facility]; evaluation of suspicious calcification of right breast; pathology with atypical ductal hyperplasia, focal cystic dilation of ducts with columnar cell change of epithelium, presence of microcalcifications.  Marland Kitchen CATARACT EXTRACTION Right 03/28/2017   [facility]  . CATARACT EXTRACTION Left 03/14/2017   [facility]  . COLONOSCOPY  01/28/2013   Dr. Ok Edwards, [place], [place]; Hyperplastic polyp x2, sessile serrated adenoma x2, tubular adenoma with low-grade dysplasia x1.  Recommend repeat colonoscopy in 3 years.  . COLONOSCOPY WITH PROPOFOL N/A 04/02/2020   Surgeon: Daneil Dolin, MD; Diverticulosis in the sigmoid and descending colon, cecal AVM, three 5-8 millimeters polyps in the rectum, mid rectum, and ascending colon resected and retrieved.  Pathology with 2 tubular adenomas, 1 hyperplastic polyp.  Recommended repeat colonoscopy in 5 years if health permits.  . ESOPHAGOGASTRODUODENOSCOPY  04/24/2019   [place]; 2.4 cm gastric submucosal mass in the fundus consistent with GIST.  FNA consistent with GIST.  Marland Kitchen ESOPHAGOGASTRODUODENOSCOPY  01/28/2013   Dr. Gwen Her Devers in [place], [place]; normal esophagus, hernia at GE junction, normal examined stomach, normal examined duodenum.  Does not appear dilation was performed.  . ESOPHAGOGASTRODUODENOSCOPY (EGD) WITH PROPOFOL N/A 04/02/2020   Surgeon: Daneil Dolin, MD;  Erosive reflux esophagitis, somewhat dilated distal esophagus s/p 60 French Maloney dilation, stigmata of prior gastric surgery, no evidence of recurrent/persisting GIST, normal examined duodenum.   Marland Kitchen GIST tumor resection  06/19/2019   [facility], robotic assisted laparoscopic partial gastrectomy x2.  Pathology consistent with GIST  . HELLER MYOTOMY  06/19/2019   [facility]; esophageal myotomy with hiatal hernia repair and dor fundoplication  . left hand surgery    . MALONEY DILATION N/A 04/02/2020   Procedure: Venia Minks DILATION;  Surgeon: Daneil Dolin, MD;  [facility];  Service: Endoscopy;  Laterality: N/A;  . POLYPECTOMY  04/02/2020   Procedure: POLYPECTOMY;  Surgeon: Daneil Dolin, MD;  [facility];  Service: Endoscopy;;  . tubal ligation    . UMBILICAL HERNIA REPAIR     1970s  . UPPER ESOPHAGEAL ENDOSCOPIC ULTRASOUND (EUS)  04/24/2019   [facility]; endoscopic findings: Tortuous esophagus s/p balloon dilation to 18 mm with no effect s/p biopsy, submucosal mass in the fundus, normal duodenum; endosonographic findings: 2.4 cm x 2.4 cm submucosal mass in the fundus s/p FNA, cholelithiasis, no hepatic, CBD, or pancreatic abnormalities.  Pathology: GIST, hepatic parenchyma with mild steato-fibrosis, reflux esophagitis  . YAG laser posterior capsulotomy Left 08/30/2018   [facility], left eye.    Current Outpatient Medications  Medication Sig Dispense Refill  . acetaminophen (TYLENOL) 500 MG tablet Take 500 mg by mouth every 6 (six) hours as needed for moderate pain.    . Ascorbic Acid (VITAMIN C) 100 MG tablet Take 100 mg by mouth daily.    . baclofen 5 MG TABS Take 5 mg by mouth 2 (two) times daily. 30 each 0  . calcium carbonate (OSCAL) 1500 (600 Ca) MG TABS tablet Take 1,500 mg by mouth daily with breakfast.    . Cholecalciferol (D3) 50 MCG (2000 UT) TABS Take 2,000  Units by mouth daily.    . Ibuprofen 200 MG CAPS Take 400 mg by mouth every 6 (six) hours as needed (pain).    Marland Kitchen imatinib (GLEEVEC) 400 MG tablet Take 400 mg by mouth daily. Take with meals and large glass of water. Caution:Chemotherapy.    . pantoprazole (PROTONIX) 40 MG tablet Take 1 tablet (40 mg total) by mouth daily. 90 tablet 3  . rosuvastatin (CRESTOR) 10 MG tablet Take 10 mg by mouth daily.     No current  facility-administered medications for this visit.    Allergies as of 05/13/2020 - Review Complete 05/13/2020  Allergen Reaction Noted  . Tape Rash 03/05/2020  . Indomethacin Hives 01/17/2020  . Tolectin [tolmetin]  01/17/2020  . Penicillins Hives and Swelling 01/17/2020  . Wound dressing adhesive Rash 01/17/2020    Family History  Problem Relation Age of Onset  . Colon cancer Mother        d.94  . Lung cancer Brother 36       smoker  . Colon cancer Paternal Grandmother        dx in her 56s  . Breast cancer Maternal Aunt        unknown age of diagnosis  . Vaginal cancer Daughter        dx <53  . Uterine cancer Daughter        dx <53  . Breast cancer Daughter        dx <53  . Breast cancer Maternal Aunt        unknown age of diagnosis  . Breast cancer Cousin        unknown age of diagnosis (maternal first cousin)  . Breast cancer Cousin        unknown age of diagnosis (maternal first cousin)    Social History   Socioeconomic History  . Marital status: Single    Spouse name: Not on file  . Number of children: Not on file  . Years of education: Not on file  . Highest education level: Not on file  Occupational History  . Occupation: retired  Tobacco Use  . Smoking status: Current Every Day Smoker    Packs/day: 1.00    Years: 47.00    Pack years: 47.00    Types: Cigarettes  . Smokeless tobacco: Never Used  Vaping Use  . Vaping Use: Never used  Substance and Sexual Activity  . Alcohol use: Yes    Comment: glass of wine occ  . Drug use: Never  . Sexual activity: Not on file  Other Topics Concern  . Not on file  Social History Narrative  . Not on file   Social Determinants of Health   Financial Resource Strain: Not on file  Food Insecurity: Not on file  Transportation Needs: No Transportation Needs  . Lack of Transportation (Medical): No  . Lack of Transportation (Non-Medical): No  Physical Activity: Inactive  . Days of Exercise per Week: 0 days  .  Minutes of Exercise per Session: 0 min  Stress: Not on file  Social Connections: Not on file    Review of Systems: Gen: Denies fever, chills, cold or flulike symptoms, lightheadedness, dizziness, presyncope, syncope. CV: Denies chest pain or palpitations. Resp: Denies dyspnea or cough. GI: See HPI Heme: See HPI  Physical Exam: BP 130/72   Pulse 75   Temp (!) 97.5 F (36.4 C) (Temporal)   Ht 4' 10"  (1.473 m)   Wt 171 lb 6.4 oz (77.7 kg)  BMI 35.82 kg/m  General:   Alert and oriented. No distress noted. Pleasant and cooperative.  Head:  Normocephalic and atraumatic. Eyes:  Conjuctiva clear without scleral icterus. Heart:  S1, S2 present without murmurs appreciated. Lungs:  Clear to auscultation bilaterally. No wheezes, rales, or rhonchi. No distress.  Abdomen:  +BS. Obese, but soft and non-tender. No rebound or guarding. No HSM or masses noted. Msk:  Symmetrical without gross deformities. Normal posture. Extremities:  Without edema. Neurologic:  Alert and  oriented x4 Psych:  Normal mood and affect.

## 2020-05-11 NOTE — Telephone Encounter (Signed)
Revealed negative genetic testing. Discussed that we do not know why she has cancer or why there is cancer in the family. There could be a genetic mutation in the family that Cassandra Fowler did not inherit. There could also be a mutation in a different gene that we are not testing, or our current technology may not be able to detect certain mutations. It will therefore be important for her to stay in contact with genetics to keep up with whether additional testing may be appropriate in the future.   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 Her result is still considered normal at this time and should not impact her medical management.

## 2020-05-11 NOTE — Progress Notes (Signed)
HPI:  Cassandra Fowler was previously seen in the [facility] clinic due to a personal and family history of cancer and concerns regarding a hereditary predisposition to cancer. Please refer to our prior cancer genetics clinic note for more information regarding our discussion, assessment and recommendations, at the time. Cassandra Fowler recent genetic test results were disclosed to her, as were recommendations warranted by these results. These results and recommendations are discussed in more detail below.  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 The report date is 05/09/2020  The Multi-Cancer + RNA Panel offered by [facility] includes sequencing and/or deletion/duplication analysis of the following 84 genes:  AIP*, ALK, APC*, ATM*, AXIN2*, BAP1*, BARD1*, BLM*, BMPR1A*, BRCA1*, BRCA2*, BRIP1*, CASR, CDC73*, CDH1*, CDK4, CDKN1B*, CDKN1C*, CDKN2A, CEBPA, CHEK2*, CTNNA1*, DICER1*, DIS3L2*, EGFR, EPCAM, FH*, FLCN*, GATA2*, GPC3, GREM1, HOXB13, HRAS, KIT, MAX*, MEN1*, MET, MITF, MLH1*, MSH2*, MSH3*, MSH6*, MUTYH*, NBN*, NF1*, NF2*, NTHL1*, PALB2*, PDGFRA, PHOX2B, PMS2*, POLD1*, POLE*, POT1*, PRKAR1A*, PTCH1*, PTEN*, RAD50*, RAD51C*, RAD51D*, RB1*, RECQL4, RET, RUNX1*, SDHA*, SDHAF2*, SDHB*, SDHC*, SDHD*, SMAD4*, SMARCA4*, SMARCB1*, SMARCE1*, STK11*, SUFU*, TERC, TERT, TMEM127*, Tp53*, TSC1*, TSC2*, VHL*, WRN*, and WT1.  RNA analysis is performed for * genes.       FAMILY HISTORY:  We obtained a detailed, 4-generation family history.  Significant diagnoses are listed below: Family History  Problem Relation Age of Onset   . Colon cancer Mother        d.94  . Lung cancer Brother 31       smoker  . Colon cancer Paternal Grandmother        dx in her 67s  . Breast cancer Maternal Aunt        unknown age of diagnosis  . Vaginal cancer Daughter        dx <53  . Uterine cancer Daughter        dx <53  . Breast cancer Daughter        dx <53  . Breast cancer Maternal Aunt        unknown age of diagnosis  . Breast cancer Cousin        unknown age of diagnosis (maternal first cousin)  . Breast cancer Cousin        unknown age of diagnosis (maternal first cousin)   Cassandra Fowler has one daughter (age 77) who has had vaginal cancer, uterine cancer, and breast cancer all diagnosed in the past 10 years. She had two brothers. One died from lung cancer at age 22 and was a smoker.  Cassandra Fowler mother died at age 83 with colon cancer. There were two maternal aunts. Both aunts had breast cancer (unknown ages of diagnosis). Two maternal first cousins had breast cancer (unknown ages of diagnosis). Cassandra Fowler maternal grandmother died at an unknown age without cancer. Her maternal grandfather died in his late 54s without cancer.  Cassandra Fowler father died at age 75 without cancer. There was one paternal aunt, and Cassandra Fowler has limited information about this relative. Cassandra Fowler paternal grandmother died at age 66 from colon cancer (diagnosed in her 22s). Her paternal grandfather died younger than 83 without cancer.   Cassandra Fowler  is unaware of previous family history of genetic testing for hereditary cancer risks. Patient's maternal ancestors are of unknown descent, and paternal ancestors are of [place] and [place] descent. There is no reported Ashkenazi Jewish ancestry. There is no known consanguinity.  GENETIC TEST RESULTS: Genetic testing reported out on 05/09/2020 through the [facility] Multi-Cancer + RNA panel. No pathogenic variants were detected.   The Multi-Cancer + RNA Panel offered by [facility]  includes sequencing and/or deletion/duplication analysis of the following 84 genes:  AIP*, ALK, APC*, ATM*, AXIN2*, BAP1*, BARD1*, BLM*, BMPR1A*, BRCA1*, BRCA2*, BRIP1*, CASR, CDC73*, CDH1*, CDK4, CDKN1B*, CDKN1C*, CDKN2A, CEBPA, CHEK2*, CTNNA1*, DICER1*, DIS3L2*, EGFR, EPCAM, FH*, FLCN*, GATA2*, GPC3, GREM1, HOXB13, HRAS, KIT, MAX*, MEN1*, MET, MITF, MLH1*, MSH2*, MSH3*, MSH6*, MUTYH*, NBN*, NF1*, NF2*, NTHL1*, PALB2*, PDGFRA, PHOX2B, PMS2*, POLD1*, POLE*, POT1*, PRKAR1A*, PTCH1*, PTEN*, RAD50*, RAD51C*, RAD51D*, RB1*, RECQL4, RET, RUNX1*, SDHA*, SDHAF2*, SDHB*, SDHC*, SDHD*, SMAD4*, SMARCA4*, SMARCB1*, SMARCE1*, STK11*, SUFU*, TERC, TERT, TMEM127*, Tp53*, TSC1*, TSC2*, VHL*, WRN*, and WT1.  RNA analysis is performed for * genes. The test report will be scanned into EPIC and located under the Molecular Pathology section of the Results Review tab.  A portion of the result report is included below for reference.     We discussed with Cassandra Fowler that because current genetic testing is not perfect, it is possible there may be a gene mutation in one of these genes that current testing cannot detect, but that chance is small.  We also discussed that there could be another gene that has not yet been discovered, or that we have not yet tested, that is responsible for the cancer diagnoses in the family. It is also possible there is a hereditary cause for the cancer in the family that Cassandra Fowler did not inherit and therefore was not identified in her testing.  Therefore, it is important to remain in touch with cancer genetics in the future so that we can continue to offer Cassandra Fowler the most up to date genetic testing.   Genetic testing did identify a variant of uncertain significance (VUS) in the MLH1 gene called c.973C>T.  At this time, it is unknown if this variant is associated with increased cancer risk or if this is a normal finding, but most variants such as this get reclassified to being  inconsequential. It should not be used to make medical management decisions. With time, we suspect the lab will determine the significance of this variant, if any. If we do learn more about it, we will try to contact Cassandra Fowler to discuss it further. However, it is important to stay in touch with [place] periodically and keep the address and phone number up to date.  ADDITIONAL GENETIC TESTING:  We discussed with Cassandra Fowler that her genetic testing was fairly extensive.  If there are genes identified to increase cancer risk that can be analyzed in the future, we would be happy to discuss and coordinate this testing at that time.    CANCER SCREENING RECOMMENDATIONS: Cassandra Fowler test result is considered negative (normal).  This means that we have not identified a hereditary cause for her personal and family history of cancer at this time. While reassuring, this does not definitively rule out a hereditary predisposition to cancer. It is still possible that there could be genetic mutations that are undetectable by current technology. There could be genetic mutations in genes that have not been tested or identified to increase cancer risk.  Therefore, it is recommended she continue to follow  the cancer management and screening guidelines provided by her oncology and primary healthcare provider.   An individual's cancer risk and medical management are not determined by genetic test results alone. Overall cancer risk assessment incorporates additional factors, including personal medical history, family history, and any available genetic information that may result in a personalized plan for cancer prevention and surveillance.  Based on Cassandra Fowler's personal and family history, as well as her genetic test results, a statistical model Midwife) was used to estimate her risk of developing breast cancer. Tyrer-Cuzick estimates her lifetime risk of developing breast cancer to be approximately 5.8%. This  lifetime breast cancer risk is a preliminary estimate based on available information using one of several models endorsed by the [place] (ACS). The ACS recommends consideration of breast MRI screening as an adjunct to mammography for patients at high risk (defined as 20% or greater lifetime risk). A more detailed breast cancer risk assessment can be considered, if clinically indicated. This risk estimate can change over time and this calculation may be repeated to reflect new information in her personal or family history in the future.     RECOMMENDATIONS FOR FAMILY MEMBERS:  Individuals in this family might be at some increased risk of developing cancer, over the general population risk, simply due to the family history of cancer.  We recommended women in this family have a yearly mammogram beginning at age 30, or 52 years younger than the earliest onset of cancer, an annual clinical breast exam, and perform monthly breast self-exams. Women in this family should also have a gynecological exam as recommended by their primary provider. All family members should be referred for colonoscopy starting at age 29.  It is also possible there is a hereditary cause for the cancer in Cassandra Fowler's family that she did not inherit and therefore was not identified in her.  Based on Cassandra Fowler's family history, we recommended her maternal cousins who had breast cancer have genetic counseling and testing. Cassandra Fowler will let us know if we can be of any assistance in coordinating genetic counseling and/or testing for these family members.   FOLLOW-UP: Lastly, we discussed with Ms. Norment that cancer genetics is a rapidly advancing field and it is possible that new genetic tests will be appropriate for her and/or her family members in the future. We encouraged her to remain in contact with cancer genetics on an annual basis so we can update her personal and family histories and let her know of  advances in cancer genetics that may benefit this family.   Our contact number was provided. Ms. Storti questions were answered to her satisfaction, and she knows she is welcome to call us at anytime with additional questions or concerns.   Clint Guy, MS, [facility] Genetic Counselor [email or web address] Phone: [phone]

## 2020-05-13 ENCOUNTER — Other Ambulatory Visit: Payer: Self-pay | Admitting: *Deleted

## 2020-05-13 ENCOUNTER — Other Ambulatory Visit: Payer: Self-pay

## 2020-05-13 ENCOUNTER — Encounter: Payer: Self-pay | Admitting: Gastroenterology

## 2020-05-13 ENCOUNTER — Ambulatory Visit: Payer: Medicare Other | Admitting: Gastroenterology

## 2020-05-13 ENCOUNTER — Encounter: Payer: Self-pay | Admitting: Internal Medicine

## 2020-05-13 VITALS — BP 130/72 | HR 75 | Temp 97.5°F | Ht <= 58 in | Wt 171.4 lb

## 2020-05-13 DIAGNOSIS — K220 Achalasia of cardia: Secondary | ICD-10-CM | POA: Diagnosis not present

## 2020-05-13 DIAGNOSIS — K760 Fatty (change of) liver, not elsewhere classified: Secondary | ICD-10-CM

## 2020-05-13 DIAGNOSIS — K2100 Gastro-esophageal reflux disease with esophagitis, without bleeding: Secondary | ICD-10-CM | POA: Diagnosis not present

## 2020-05-13 DIAGNOSIS — K838 Other specified diseases of biliary tract: Secondary | ICD-10-CM

## 2020-05-13 DIAGNOSIS — R932 Abnormal findings on diagnostic imaging of liver and biliary tract: Secondary | ICD-10-CM

## 2020-05-13 HISTORY — DX: Abnormal findings on diagnostic imaging of liver and biliary tract: R93.2

## 2020-05-13 HISTORY — DX: Fatty (change of) liver, not elsewhere classified: K76.0

## 2020-05-13 NOTE — Assessment & Plan Note (Addendum)
History of achalasia s/p Heller myotomy and hiatal hernia repair with fundoplication in [place] in June 2021.  BPE January 2022 with slight dilation of distal esophagus just above the GE junction potentially related to prior myotomy and achalasia, no mass or stricture, diffuse dysmotility.  Recent EGD March 2022 with erosive reflux esophagitis,  somewhat dilated distal esophagus s/p 60 French Maloney dilation with mild improvement in symptoms.  She continues with feeling foods and liquids get hung in her esophagus intermittently.  We will refer her to [facility] for further management of dysphagia/achalasia.

## 2020-05-13 NOTE — Assessment & Plan Note (Addendum)
Noted dilated common CBD on CT during recent hospitalization.  Follow-up MRI/MRCP with mild extrahepatic biliary dilation with borderline prominence of the intrahepatic biliary tree, no filling defect. LFTs remain within normal limits.  Recommended follow-up LFTs in 2 to 3 months.  Most recent LFTs April 2022 remain normal.  Notably, patient has history of dilated CBD on CT in May 2021 in [place].  Prior EUS in April 2021 in [place] with no significant liver, biliary, or pancreatic abnormalities.  She continues to be without biliary symptoms.  We will repeat HFP in June for monitoring.

## 2020-05-13 NOTE — Assessment & Plan Note (Addendum)
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  Follow-up abdominal ultrasound with elastography in January 2022 with diffusely increased parenchymal echogenicity. kPa 1.6.  No signs or symptoms of advanced liver disease.  Cassandra Fowler has not been following previous fatty liver recommendations and continues to eat fried/fatty foods and sweets frequently.  No significant alcohol use. LFTs have been within normal limits.  Counseled on clinical course of fatty liver would be potential to progress to fibrosis and cirrhosis. Instructions for fatty liver: Recommend 1-2# weight loss per week until ideal body weight through exercise & diet. Low fat/cholesterol diet.   Avoid sweets, sodas, fruit juices, sweetened beverages like tea, etc. Gradually increase exercise from 15 min daily up to 1 hr per day 5 days/week. Limit alcohol use.  Follow-up in 6 months.

## 2020-05-13 NOTE — Assessment & Plan Note (Addendum)
6 mm echogenic focus within the gallbladder which could represent adherent gallbladder wall adenomyomatosis or a gallbladder polyp.  Recommended ultrasound in 6 months for surveillance. She is on recall.

## 2020-05-13 NOTE — Assessment & Plan Note (Addendum)
Chronic with history of achalasia s/p heller myotomy, hiatal hernia repair/fundoplication in [address]159 in [place].  Recent EGD March 2022 with erosive reflux esophagitis, somewhat dilated distal esophagus s/p 60 French Maloney dilation, stigmata of prior gastric surgery, no evidence of recurrent/persistent GIST, normal examined duodenum.  She was started on Protonix 40 mg daily and has had significant improvement in her reflux symptoms. Occasional breakthrough once every 1-2 weeks which responds well to Tums or Pepto-Bismol.   She was advised to continue her current medications.  GERD diet/lifestyle was reinforced.  Written instructions were provided. Follow-up in 6 months.

## 2020-05-13 NOTE — Patient Instructions (Addendum)
We are referring you to [facility] for further management of achalasia.  For now, I recommend you eat slowly, take small bites, chew thoroughly.  Stay upright for 2-3 hours after meals.   We will arrange for you to have blood work to recheck your liver enzymes in June.  Continue Protonix 40 mg daily 30 minutes before breakfast.  Follow a GERD diet:  Avoid fried, fatty, greasy, spicy, citrus foods. Avoid caffeine and carbonated beverages. Avoid chocolate. Try eating 4-6 small meals a day rather than 3 large meals. Do not eat within 3 hours of laying down. Prop head of bed up on wood or bricks to create a 6 inch incline.  Instructions for fatty liver: Recommend 1-2# weight loss per week until ideal body weight through exercise & diet. Low fat/cholesterol diet.   Avoid sweets, sodas, fruit juices, sweetened beverages like tea, etc. Gradually increase exercise from 15 min daily up to 1 hr per day 5 days/week. Limit alcohol use.   We will plan to see back in 6 months.  Do not hesitate to call if he has any questions or concerns prior.  It was good to see you today!  Aliene Altes, PA-C [facility]

## 2020-05-14 ENCOUNTER — Other Ambulatory Visit (HOSPITAL_COMMUNITY)
Admission: RE | Admit: 2020-05-14 | Discharge: 2020-05-14 | Disposition: A | Discharge status: Home / Self Care | Payer: Medicare Other | Source: Ambulatory Visit | Attending: Gastroenterology | Admitting: Gastroenterology

## 2020-05-14 ENCOUNTER — Other Ambulatory Visit: Payer: Self-pay

## 2020-05-14 DIAGNOSIS — K760 Fatty (change of) liver, not elsewhere classified: Secondary | ICD-10-CM | POA: Diagnosis present

## 2020-05-14 LAB — HEPATIC FUNCTION PANEL
ALT: 20 U/L (ref 0–44)
AST: 15 U/L (ref 15–41)
Albumin: 3.7 g/dL (ref 3.5–5.0)
Alkaline Phosphatase: 49 U/L (ref 38–126)
Bilirubin, Direct: 0.1 mg/dL (ref 0.0–0.2)
Indirect Bilirubin: 0.6 mg/dL (ref 0.3–0.9)
Total Bilirubin: 0.7 mg/dL (ref 0.3–1.2)
Total Protein: 6.1 g/dL — ABNORMAL LOW (ref 6.5–8.1)

## 2020-05-18 ENCOUNTER — Ambulatory Visit (HOSPITAL_COMMUNITY): Payer: Medicare Other | Admitting: Hematology

## 2020-05-20 ENCOUNTER — Other Ambulatory Visit: Payer: Self-pay

## 2020-05-20 ENCOUNTER — Ambulatory Visit (HOSPITAL_COMMUNITY)
Admission: RE | Admit: 2020-05-20 | Discharge: 2020-05-20 | Disposition: A | Discharge status: Home / Self Care | Payer: Medicare Other | Source: Ambulatory Visit | Attending: Hematology | Admitting: Hematology

## 2020-05-20 ENCOUNTER — Other Ambulatory Visit (HOSPITAL_COMMUNITY)
Admission: RE | Admit: 2020-05-20 | Discharge: 2020-05-20 | Disposition: A | Discharge status: Home / Self Care | Payer: Medicare Other | Source: Ambulatory Visit | Attending: Gastroenterology | Admitting: Gastroenterology

## 2020-05-20 ENCOUNTER — Ambulatory Visit (HOSPITAL_COMMUNITY): Payer: Medicare Other | Admitting: Hematology

## 2020-05-20 DIAGNOSIS — N281 Cyst of kidney, acquired: Secondary | ICD-10-CM | POA: Diagnosis not present

## 2020-05-20 DIAGNOSIS — K760 Fatty (change of) liver, not elsewhere classified: Secondary | ICD-10-CM | POA: Insufficient documentation

## 2020-05-20 DIAGNOSIS — I700 Atherosclerosis of aorta: Secondary | ICD-10-CM | POA: Insufficient documentation

## 2020-05-20 DIAGNOSIS — Z8509 Personal history of malignant neoplasm of other digestive organs: Secondary | ICD-10-CM | POA: Diagnosis not present

## 2020-05-20 DIAGNOSIS — I2510 Atherosclerotic heart disease of native coronary artery without angina pectoris: Secondary | ICD-10-CM | POA: Insufficient documentation

## 2020-05-20 DIAGNOSIS — R911 Solitary pulmonary nodule: Secondary | ICD-10-CM | POA: Diagnosis not present

## 2020-05-20 LAB — HEPATIC FUNCTION PANEL
ALT: 20 U/L (ref 0–44)
AST: 19 U/L (ref 15–41)
Albumin: 3.6 g/dL (ref 3.5–5.0)
Alkaline Phosphatase: 59 U/L (ref 38–126)
Bilirubin, Direct: 0.1 mg/dL (ref 0.0–0.2)
Indirect Bilirubin: 0.7 mg/dL (ref 0.3–0.9)
Total Bilirubin: 0.8 mg/dL (ref 0.3–1.2)
Total Protein: 6 g/dL — ABNORMAL LOW (ref 6.5–8.1)

## 2020-05-20 MED ORDER — IOHEXOL 300 MG/ML  SOLN
100.0000 mL | Freq: Once | INTRAMUSCULAR | Status: AC | PRN
  Administered 2020-05-20: 100 mL via INTRAVENOUS

## 2020-05-22 ENCOUNTER — Ambulatory Visit (HOSPITAL_COMMUNITY): Payer: Medicare Other | Admitting: Hematology and Oncology

## 2020-05-25 ENCOUNTER — Encounter: Payer: Self-pay | Admitting: Genetic Counselor

## 2020-06-05 ENCOUNTER — Other Ambulatory Visit: Payer: Self-pay

## 2020-06-05 ENCOUNTER — Inpatient Hospital Stay (HOSPITAL_COMMUNITY): Payer: Medicare Other | Attending: Hematology | Admitting: Hematology and Oncology

## 2020-06-05 ENCOUNTER — Encounter (HOSPITAL_COMMUNITY): Payer: Self-pay | Admitting: Hematology and Oncology

## 2020-06-05 VITALS — BP 128/74 | HR 79 | Temp 96.7°F | Resp 18 | Wt 168.7 lb

## 2020-06-05 DIAGNOSIS — Z903 Acquired absence of stomach [part of]: Secondary | ICD-10-CM | POA: Diagnosis not present

## 2020-06-05 DIAGNOSIS — N6091 Unspecified benign mammary dysplasia of right breast: Secondary | ICD-10-CM

## 2020-06-05 DIAGNOSIS — Z79899 Other long term (current) drug therapy: Secondary | ICD-10-CM | POA: Diagnosis not present

## 2020-06-05 DIAGNOSIS — C49A2 Gastrointestinal stromal tumor of stomach: Secondary | ICD-10-CM | POA: Diagnosis not present

## 2020-06-05 DIAGNOSIS — E785 Hyperlipidemia, unspecified: Secondary | ICD-10-CM | POA: Insufficient documentation

## 2020-06-05 DIAGNOSIS — F17210 Nicotine dependence, cigarettes, uncomplicated: Secondary | ICD-10-CM | POA: Insufficient documentation

## 2020-06-05 DIAGNOSIS — C49A4 Gastrointestinal stromal tumor of large intestine: Secondary | ICD-10-CM

## 2020-06-05 DIAGNOSIS — Z1379 Encounter for other screening for genetic and chromosomal anomalies: Secondary | ICD-10-CM | POA: Diagnosis not present

## 2020-06-05 NOTE — Progress Notes (Addendum)
[place] [address] [place] [address]   CLINIC:  Medical Oncology/Hematology  CONSULT NOTE  Patient Care Team: Leonie Douglas, MD as PCP - General (Family Medicine) Gala Romney Cristopher Estimable, MD as Consulting Physician (Gastroenterology)  CHIEF COMPLAINTS/PURPOSE OF CONSULTATION:  GIST surveillance.  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 Initial staging scans which showed a 4 x 2 x 2 cm ovoid mass arising from the fundal region of the stomach, no evidence of metastatic disease, 6 mm solitary pulmonary nodule in the left upper lobe which is nonspecific in appearance. She underwent robotic assisted laparoscopic partial gastrectomy, hiatal hernia repair, esophageal myotomy and fundoplication on June 19, 7406. Pathology showed multifocal GIST, single focus measuring 1.7 cm without any concern for margin involvement, second focus measured 4.7 cm. Mitotic rate for both small and large lesions was less than 5 per 5 millimeter square.  Low-grade I reviewed over 70 pages of medical records but did not see any pathology report with KIT/PDGFRA mutation testing. She is here for follow-up, since her last visit, has been doing really well.  No concerning abdominal complaints.  She continues to deal with some difficulty swallowing which has been a chronic complaint and recent evaluation of esophageal stricture and also follows up with gastroenterology.  No worsening heartburn, she has had 1 at baseline.  No change in bowel habits.  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  Rest of the pertinent 10 point ROS reviewed and negative.  MEDICAL HISTORY:  Past Medical History:  Diagnosis Date  . Achalasia   .  Arthritis   . Family history of breast cancer   . Family history of colon cancer   . Family history of lung cancer   . Family history of uterine cancer   . GERD (gastroesophageal reflux disease)   . GIST (gastrointestinal stroma tumor), malignant, colon ([place]) 04/24/2019   s/p resection of 2 tumors in June 2021 at [facility] in [place]  . HLD (hyperlipidemia)     SURGICAL HISTORY: Past Surgical History:  Procedure Laterality Date  . BREAST BIOPSY Right 09/06/2019   [facility]; evaluation of suspicious calcification of right breast; pathology with atypical ductal hyperplasia, focal cystic dilation of ducts with columnar cell change of epithelium, presence of microcalcifications.  Marland Kitchen CATARACT EXTRACTION Right 03/28/2017   [facility]  . CATARACT EXTRACTION Left 03/14/2017   [facility]  . COLONOSCOPY  01/28/2013   Dr. Ok Edwards, [place], [place]; Hyperplastic polyp x2, sessile serrated adenoma x2, tubular adenoma with low-grade dysplasia x1.  Recommend repeat colonoscopy in 3 years.  . COLONOSCOPY WITH PROPOFOL N/A 04/02/2020   Surgeon: Daneil Dolin, MD; Diverticulosis in the sigmoid and descending colon, cecal AVM, three 5-8 millimeters polyps in the rectum, mid rectum, and ascending colon resected and retrieved.  Pathology with 2 tubular adenomas, 1 hyperplastic polyp.  Recommended repeat colonoscopy in 5 years if health permits.  . ESOPHAGOGASTRODUODENOSCOPY  04/24/2019   [place]; 2.4 cm gastric submucosal mass in the fundus consistent with GIST.  FNA consistent with GIST.  Marland Kitchen ESOPHAGOGASTRODUODENOSCOPY  01/28/2013   Dr. Gwen Her Devers in [place], [place]; normal esophagus, hernia at GE junction, normal examined stomach, normal examined duodenum.  Does not appear  dilation was performed.  . ESOPHAGOGASTRODUODENOSCOPY (EGD) WITH PROPOFOL N/A 04/02/2020   Surgeon: Daneil Dolin, MD;  Erosive reflux esophagitis, somewhat dilated  distal esophagus s/p 60 French Maloney dilation, stigmata of prior gastric surgery, no evidence of recurrent/persisting GIST, normal examined duodenum.   Marland Kitchen GIST tumor resection  06/19/2019   [facility], robotic assisted laparoscopic partial gastrectomy x2.  Pathology consistent with GIST  . HELLER MYOTOMY  06/19/2019   [facility]; esophageal myotomy with hiatal hernia repair and dor fundoplication  . left hand surgery    . MALONEY DILATION N/A 04/02/2020   Procedure: Venia Minks DILATION;  Surgeon: Daneil Dolin, MD;  Location: [facility];  Service: Endoscopy;  Laterality: N/A;  . POLYPECTOMY  04/02/2020   Procedure: POLYPECTOMY;  Surgeon: Daneil Dolin, MD;  Location: [facility] ENDO SU[facility];  Service: Endoscopy;;  . tubal ligation    . UMBILICAL HERNIA REPAIR     1970s  . UPPER ESOPHAGEAL ENDOSCOPIC ULTRASOUND (EUS)  04/24/2019   [facility]; endoscopic findings: Tortuous esophagus s/p balloon dilation to 18 mm with no effect s/p biopsy, submucosal mass in the fundus, normal duodenum; endosonographic findings: 2.4 cm x 2.4 cm submucosal mass in the fundus s/p FNA, cholelithiasis, no hepatic, CBD, or pancreatic abnormalities.  Pathology: GIST, hepatic parenchyma with mild steato-fibrosis, reflux esophagitis  . YAG laser posterior capsulotomy Left 08/30/2018   [facility], left eye.    SOCIAL HISTORY: Social History   Socioeconomic History  . Marital status: Single    Spouse name: Not on file  . Number of children: Not on file  . Years of education: Not on file  . Highest education level: Not on file  Occupational History  . Occupation: retired  Tobacco Use  . Smoking status: Current Every Day Smoker    Packs/day: 1.00    Years: 47.00    Pack years: 47.00    Types: Cigarettes  . Smokeless tobacco: Never Used  Vaping Use  . Vaping Use: Never used  Substance and Sexual Activity  . Alcohol use: Yes    Comment: glass of  wine occ  . Drug use: Never  . Sexual activity: Not on file  Other Topics Concern  . Not on file  Social History Narrative  . Not on file   Social Determinants of Health   Financial Resource Strain: Not on file  Food Insecurity: Not on file  Transportation Needs: No Transportation Needs  . Lack of Transportation (Medical): No  . Lack of Transportation (Non-Medical): No  Physical Activity: Inactive  . Days of Exercise per Week: 0 days  . Minutes of Exercise per Session: 0 min  Stress: Not on file  Social Connections: Not on file  Intimate Partner Violence: Not At Risk  . Fear of Current or Ex-Partner: No  . Emotionally Abused: No  . Physically Abused: No  . Sexually Abused: No    FAMILY HISTORY: Family History  Problem Relation Age of Onset  . Cancer Mother        d. 40 "where the stomach meets the intestine" "apple-core"  . Lung cancer Brother 66       smoker  . Colon cancer Paternal Grandmother        dx in her 33s  . Breast cancer Maternal Aunt        unknown age of diagnosis  . Vaginal cancer Daughter        dx <53  . Uterine cancer Daughter  dx <53  . Breast cancer Daughter        dx <53  . Breast cancer Maternal Aunt        unknown age of diagnosis  . Breast cancer Cousin        unknown age of diagnosis (maternal first cousin)  . Breast cancer Cousin        unknown age of diagnosis (maternal first cousin)    ALLERGIES:  is allergic to tape, indomethacin, tolectin [tolmetin], penicillins, and wound dressing adhesive.  MEDICATIONS:  Current Outpatient Medications  Medication Sig Dispense Refill  . acetaminophen (TYLENOL) 500 MG tablet Take 500 mg by mouth every 6 (six) hours as needed for moderate pain.    . Ascorbic Acid (VITAMIN C) 100 MG tablet Take 100 mg by mouth daily.    . baclofen 5 MG TABS Take 5 mg by mouth 2 (two) times daily. 30 each 0  . calcium carbonate (OSCAL) 1500 (600 Ca) MG TABS tablet Take 1,500 mg by mouth daily with  breakfast.    . Cholecalciferol (D3) 50 MCG (2000 UT) TABS Take 2,000 Units by mouth daily.    . Ibuprofen 200 MG CAPS Take 400 mg by mouth every 6 (six) hours as needed (pain).    Marland Kitchen imatinib (GLEEVEC) 400 MG tablet Take 400 mg by mouth daily. Take with meals and large glass of water. Caution:Chemotherapy.    . pantoprazole (PROTONIX) 40 MG tablet Take 1 tablet (40 mg total) by mouth daily. 90 tablet 3  . rosuvastatin (CRESTOR) 10 MG tablet Take 10 mg by mouth daily.     No current facility-administered medications for this visit.    PHYSICAL EXAMINATION: ECOG PERFORMANCE STATUS: 0 - Asymptomatic  Vitals:   06/05/20 0903  BP: 128/74  Pulse: 79  Resp: 18  Temp: (!) 96.7 F (35.9 C)  SpO2: 97%   Filed Weights   06/05/20 0903  Weight: 168 lb 11.2 oz (76.5 kg)   Physical Exam Vitals reviewed.  Constitutional:      Appearance: Normal appearance. She is obese.  Cardiovascular:     Rate and Rhythm: Normal rate and regular rhythm.     Pulses: Normal pulses.     Heart sounds: Normal heart sounds.  Pulmonary:     Effort: Pulmonary effort is normal.     Breath sounds: Normal breath sounds.  Chest:  Breasts:     Right: No axillary adenopathy or supraclavicular adenopathy.     Left: No axillary adenopathy or supraclavicular adenopathy.    Abdominal:     Palpations: Abdomen is soft. There is no hepatomegaly, splenomegaly or mass.     Tenderness: There is no abdominal tenderness.     Hernia: No hernia is present.  Musculoskeletal:     Right lower leg: No edema.     Left lower leg: No edema.  Lymphadenopathy:     Cervical: No cervical adenopathy.     Upper Body:     Right upper body: No supraclavicular, axillary or pectoral adenopathy.     Left upper body: No supraclavicular, axillary or pectoral adenopathy.  Neurological:     General: No focal deficit present.     Mental Status: She is alert and oriented to person, place, and time.  Psychiatric:        Mood and Affect:  Mood normal.        Behavior: Behavior normal.      LABORATORY DATA:  I have reviewed the data as listed CBC Latest Ref  Rng & Units 04/23/2020 03/31/2020 03/11/2020  WBC 4.0 - 10.5 K/uL 15.3(H) 9.6 11.8(H)  Hemoglobin 12.0 - 15.0 g/dL 13.4 12.8 11.9(L)  Hematocrit 36.0 - 46.0 % 40.3 38.2 36.3  Platelets 150 - 400 K/uL 261 228 219   CMP Latest Ref Rng & Units 05/20/2020 05/14/2020 04/23/2020  Glucose 70 - 99 mg/dL - - 78  BUN 8 - 23 mg/dL - - 14  Creatinine 0.44 - 1.00 mg/dL - - 0.74  Sodium 135 - 145 mmol/L - - 142  Potassium 3.5 - 5.1 mmol/L - - 3.7  Chloride 98 - 111 mmol/L - - 105  CO2 22 - 32 mmol/L - - 29  Calcium 8.9 - 10.3 mg/dL - - 9.5  Total Protein 6.5 - 8.1 g/dL 6.0(L) 6.1(L) 6.7  Total Bilirubin 0.3 - 1.2 mg/dL 0.8 0.7 0.8  Alkaline Phos 38 - 126 U/L 59 49 59  AST 15 - 41 U/L 19 15 20   ALT 0 - 44 U/L 20 20 33    RADIOGRAPHIC STUDIES: I have personally reviewed the radiological images as listed and agreed with the findings in the report. CT CHEST ABDOMEN PELVIS W CONTRAST  Result Date: 05/20/2020 CLINICAL DATA:  Gastrointestinal cancer.  Follow-up GIST tumor. EXAM: CT CHEST, ABDOMEN, AND PELVIS WITH CONTRAST TECHNIQUE: Multidetector CT imaging of the chest, abdomen and pelvis was performed following the standard protocol during bolus administration of intravenous contrast. CONTRAST:  137m OMNIPAQUE IOHEXOL 300 MG/ML  SOLN COMPARISON:  None. FINDINGS: CT CHEST FINDINGS Cardiovascular: Heart size is normal. Aortic atherosclerosis. Coronary artery calcifications. No pericardial effusion. Mediastinum/Nodes: No enlarged mediastinal, hilar, or axillary lymph nodes. Thyroid gland, trachea, and esophagus demonstrate no significant findings. Lungs/Pleura: No pleural effusion. Nodule within the left upper lobe measures 6 mm, image 39/3. No additional lung nodules identified. Musculoskeletal: Degenerative disc disease identified. No acute or suspicious osseous findings. CT ABDOMEN PELVIS  FINDINGS Hepatobiliary: No suspicious liver lesion. Borderline prominence of intrahepatic bile ducts. Increase caliber of the common bile duct which measures 1 cm in maximum diameter, unchanged from recent MRI. The knee no choledocholithiasis or obstructing mass noted. Pancreas: Unremarkable. No pancreatic ductal dilatation or surrounding inflammatory changes. Spleen: Normal in size without focal abnormality. Adrenals/Urinary Tract: Normal adrenal glands. There are several cysts within the left kidney. The largest arises from the posterior cortex of the inferior pole measuring 4.5 cm. No hydronephrosis identified bilaterally. Bladder unremarkable. Stomach/Bowel: Postoperative changes involving the proximal stomach. Bladder is otherwise unremarkable. The appendix is visualized and appears normal. No bowel wall thickening, inflammation or distension. Distal colonic diverticula noted without signs of acute inflammation. Vascular/Lymphatic: Aortic atherosclerosis. No aneurysm. No abdominal adenopathy no pelvic or inguinal adenopathy. Reproductive: Hysterectomy.  No adnexal mass. Other: No abdominal wall hernia or abnormality. No abdominopelvic ascites. Musculoskeletal: Degenerative disc disease noted at L5-S1. 3 mm anterolisthesis of L4 on L5 noted. No acute or suspicious osseous findings. IMPRESSION: 1. No acute findings within the chest, abdomen or pelvis. No specific findings identified to suggest residual or recurrent tumor or metastatic disease. 2. 6 mm left upper lobe lung nodule is identified. This is a nonspecific finding and no prior imaging available for comparison. Consider close interval surveillance with repeat CT of the chest in 3-6 months to assess for any temporal change in the appearance of this nodule. 3. Aortic atherosclerosis and coronary artery calcifications. 4. Left renal cysts. Aortic Atherosclerosis (ICD10-I70.0). Electronically Signed   By: TKerby MoorsM.D.   On: 05/20/2020 16:08  ASSESSMENT:  1.  Multifocal gastric GIST tumors: -CT CAP on 05/30/2019 at [facility] in [place] shows 4 x 2 x 2 cm mass in fundus with no metastatic disease.  6 mm solitary pulmonary nodule in the left upper lobe. -On 06/19/2019-partial gastrectomy x2, hiatal hernia repair, esophageal myotomy -Pathology shows 2 gastrointestinal stromal tumors, smaller lesion measuring 0.7 cm and larger lesion measuring 4.7 cm, mitotic rate less than 5/19m2 for both lesions, low-grade, very low risk, no lymph nodes found (PT 1 and PT 2 N0). -[facility] 400 mg daily.  2.  Right breast atypical ductal hyperplasia: -Status post biopsy on 09/06/2019 of indeterminate microcalcifications 7-8:00 consistent with atypical ductal hyperplasia.   PLAN:  1.  Multifocal gastric GIST tumors: Personally reviewed outside medical records which showed multifocal GIST, considered to be very low risk of recurrence based on mitotic rate, size and location of the GIST tumor, typically does not qualify for adjuvant imatinib.  She tells me that she is part of a clinical trial and hence started on imatinib for 3 years.  Her most recent imaging is without any concern for recurrence. Since she is technically not part of the trial anymore, I will discuss with Dr. KDelton Coombesif he would like to continue to monitor versus follow-up.  The 6 mm left upper lobe lung nodule was also mentioned on the previous scans, this can be monitored.  2.  With regards to the right breast atypical ductal hyperplasia, discussed about tamoxifen for breast cancer prevention.  Printed material about tamoxifen, discussed the reduced risk of ipsilateral, contralateral breast cancer as well as DCIS but may not improve survival. She would like to think about this and discussed with Dr. KDelton Coombeson her next visit. I ordered repeat mammogram bilateral due  Aug Then, upon review of records, I didn't notice that there is an excisional biopsy done on the right breast. I will discuss with Dr KDelton Coombesabout sending her to breast surgery.  3.  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  I spent over 45 minutes in the care of this patient, had to review multiple outside medical records to get clarity on the gist tumors, reviewed recommendations for adjuvant treatment for GIST tumors, reviewed recommendations for atypical ductal hyperplasia.  Genetic testing with variant of unknown significance in MLH1, no immediate recommendations.   PBenay PikeMD

## 2020-06-05 NOTE — Addendum Note (Signed)
Addended by: Adaline Sill on: 06/05/2020 02:27 PM   Modules accepted: Orders

## 2020-07-07 ENCOUNTER — Telehealth: Payer: Self-pay | Admitting: Internal Medicine

## 2020-07-07 NOTE — Telephone Encounter (Signed)
RECALL FOR ULTRASOUND 

## 2020-07-07 NOTE — Telephone Encounter (Signed)
Pt had CT A/P 5/11 and [place] 3/1. Is [place] still needed now? thanks

## 2020-07-08 NOTE — Telephone Encounter (Signed)
Please NIC

## 2020-07-08 NOTE — Telephone Encounter (Signed)
Recommend repeat RUQ [place] in September 2022 to follow-up on gallbladder polyp vs adenomyomatosis.

## 2020-07-09 ENCOUNTER — Ambulatory Visit (HOSPITAL_COMMUNITY): Payer: Medicare Other | Admitting: Hematology and Oncology

## 2020-07-09 ENCOUNTER — Other Ambulatory Visit: Payer: Self-pay

## 2020-07-09 ENCOUNTER — Encounter (HOSPITAL_COMMUNITY): Payer: Self-pay | Admitting: Hematology and Oncology

## 2020-07-09 ENCOUNTER — Inpatient Hospital Stay (HOSPITAL_COMMUNITY): Payer: Medicare Other | Attending: Hematology and Oncology | Admitting: Hematology and Oncology

## 2020-07-09 VITALS — BP 132/72 | HR 65 | Temp 98.3°F | Resp 18 | Wt 161.7 lb

## 2020-07-09 DIAGNOSIS — Z903 Acquired absence of stomach [part of]: Secondary | ICD-10-CM | POA: Diagnosis not present

## 2020-07-09 DIAGNOSIS — Z79899 Other long term (current) drug therapy: Secondary | ICD-10-CM | POA: Insufficient documentation

## 2020-07-09 DIAGNOSIS — E785 Hyperlipidemia, unspecified: Secondary | ICD-10-CM | POA: Diagnosis not present

## 2020-07-09 DIAGNOSIS — C49A2 Gastrointestinal stromal tumor of stomach: Secondary | ICD-10-CM | POA: Diagnosis present

## 2020-07-09 DIAGNOSIS — N6091 Unspecified benign mammary dysplasia of right breast: Secondary | ICD-10-CM | POA: Diagnosis not present

## 2020-07-09 DIAGNOSIS — K219 Gastro-esophageal reflux disease without esophagitis: Secondary | ICD-10-CM | POA: Insufficient documentation

## 2020-07-09 DIAGNOSIS — C49A4 Gastrointestinal stromal tumor of large intestine: Secondary | ICD-10-CM

## 2020-07-09 MED ORDER — IMATINIB MESYLATE 400 MG PO TABS: 400.0000 mg | ORAL_TABLET | Freq: Every day | ORAL | 1 refills | Status: DC

## 2020-07-09 NOTE — Progress Notes (Signed)
[place] [address] [place] [address]   CLINIC:  Medical Oncology/Hematology  CONSULT NOTE  Patient Care Team: Leonie Douglas, MD as PCP - General (Family Medicine) Gala Romney Cristopher Estimable, MD as Consulting Physician (Gastroenterology)  CHIEF COMPLAINTS/PURPOSE OF CONSULTATION:  GIST surveillance.  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 Initial staging scans which showed a 4 x 2 x 2 cm ovoid mass arising from the fundal region of the stomach, no evidence of metastatic disease, 6 mm solitary pulmonary nodule in the left upper lobe which is nonspecific in appearance. She underwent robotic assisted laparoscopic partial gastrectomy, hiatal hernia repair, esophageal myotomy and fundoplication on June 19, 90. Pathology showed multifocal GIST, single focus measuring 1.7 cm without any concern for margin involvement, second focus measured 4.7 cm. Mitotic rate for both small and large lesions was less than 5 per 5 millimeter square.  Low-grade I reviewed over 70 pages of medical records but did not see any pathology report with KIT/PDGFRA mutation testing. 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  Interval History She is here for FU by herself. No complaints today She wants to continue gleevec, would like a refill. She also wants to FU with breast surgery She doesn't know if she wants to take tamoxifen.  Rest of the pertinent 10 point ROS reviewed and negative.  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   CATARACT EXTRACTION Right 03/28/2017   [facility]   CATARACT EXTRACTION Left 03/14/2017   [facility]   COLONOSCOPY  01/28/2013   Dr. Ok Edwards, [place], [place]; Hyperplastic polyp x2, sessile serrated adenoma x2, tubular adenoma with low-grade dysplasia x1.  Recommend repeat colonoscopy in 3 years.   COLONOSCOPY WITH PROPOFOL N/A 04/02/2020   Surgeon: Daneil Dolin, MD; Diverticulosis in the sigmoid and descending colon, cecal AVM, three 5-8 millimeters polyps in the rectum, mid rectum, and ascending colon resected and retrieved.  Pathology with 2 tubular adenomas, 1 hyperplastic polyp.  Recommended repeat colonoscopy in 5 years if health permits.   ESOPHAGOGASTRODUODENOSCOPY  04/24/2019   [place]; 2.4 cm gastric submucosal mass in the fundus consistent with GIST.  FNA consistent with GIST.   ESOPHAGOGASTRODUODENOSCOPY  01/28/2013   Dr. Gwen Her Devers in [place], [place]; normal esophagus, hernia at GE junction, normal examined stomach, normal examined duodenum.  Does not appear dilation was performed.   ESOPHAGOGASTRODUODENOSCOPY (EGD) WITH PROPOFOL N/A 04/02/2020   Surgeon: Daneil Dolin, MD;  Erosive reflux esophagitis, somewhat dilated distal esophagus  s/p 52 French Maloney dilation, stigmata of prior gastric surgery, no evidence of recurrent/persisting GIST, normal examined duodenum.    GIST tumor resection   06/19/2019   [facility], robotic assisted laparoscopic partial gastrectomy x2.  Pathology consistent with GIST   HELLER MYOTOMY  06/19/2019   [facility]; esophageal myotomy with hiatal hernia repair and dor fundoplication   left hand surgery     MALONEY DILATION N/A 04/02/2020   Procedure: Venia Minks DILATION;  Surgeon: Daneil Dolin, MD;  [facility];  Service: Endoscopy;  Laterality: N/A;   POLYPECTOMY  04/02/2020   Procedure: POLYPECTOMY;  Surgeon: Daneil Dolin, MD;  Location[facility];  Service: Endoscopy;;   tubal ligation     UMBILICAL HERNIA REPAIR     1970s   UPPER ESOPHAGEAL ENDOSCOPIC ULTRASOUND (EUS)  04/24/2019   [facility]; endoscopic findings: Tortuous esophagus s/p balloon dilation to 18 mm with no effect s/p biopsy, submucosal mass in the fundus, normal duodenum; endosonographic findings: 2.4 cm x 2.4 cm submucosal mass in the fundus s/p FNA, cholelithiasis, no hepatic, CBD, or pancreatic abnormalities.  Pathology: GIST, hepatic parenchyma with mild steato-fibrosis, reflux esophagitis   YAG laser posterior capsulotomy Left 08/30/2018   [facility], left eye.    SOCIAL HISTORY: Social History   Socioeconomic History   Marital status: Single    Spouse name: Not on file   Number of children: Not on file   Years of education: Not on file   Highest education level: Not on file  Occupational History   Occupation: retired  Tobacco Use   Smoking status: Every Day    Packs/day: 1.00    Years: 47.00    Pack years: 47.00    Types: Cigarettes   Smokeless tobacco: Never  Vaping Use   Vaping Use: Never used  Substance and Sexual Activity   Alcohol use: Yes    Comment: glass of wine occ   Drug use: Never   Sexual activity: Not on file  Other Topics Concern   Not on file  Social History Narrative   Not on file   Social Determinants of Health   Financial Resource Strain: Not on  file  Food Insecurity: Not on file  Transportation Needs: No Transportation Needs   Lack of Transportation (Medical): No   Lack of Transportation (Non-Medical): No  Physical Activity: Inactive   Days of Exercise per Week: 0 days   Minutes of Exercise per Session: 0 min  Stress: Not on file  Social Connections: Not on file  Intimate Partner Violence: Not At Risk   Fear of Current or Ex-Partner: No   Emotionally Abused: No   Physically Abused: No   Sexually Abused: No    FAMILY HISTORY: Family History  Problem Relation Age of Onset   Cancer Mother        d. 71 "where the stomach meets the intestine" "apple-core"   Lung cancer Brother 75       smoker   Colon cancer Paternal Grandmother        dx in her 70s   Breast cancer Maternal Aunt        unknown age of diagnosis   Vaginal cancer Daughter        dx <53   Uterine cancer Daughter        dx <53   Breast cancer Daughter        dx <53   Breast cancer Maternal Aunt  unknown age of diagnosis   Breast cancer Cousin        unknown age of diagnosis (maternal first cousin)   Breast cancer Cousin        unknown age of diagnosis (maternal first cousin)    ALLERGIES:  is allergic to tape, indomethacin, tolectin [tolmetin], penicillins, and wound dressing adhesive.  MEDICATIONS:  Current Outpatient Medications  Medication Sig Dispense Refill   acetaminophen (TYLENOL) 500 MG tablet Take 500 mg by mouth every 6 (six) hours as needed for moderate pain.     baclofen 5 MG TABS Take 5 mg by mouth 2 (two) times daily. (Patient taking differently: Take 10 mg by mouth 2 (two) times daily.) 30 each 0   calcium carbonate (OSCAL) 1500 (600 Ca) MG TABS tablet Take 1,500 mg by mouth daily with breakfast.     Cholecalciferol (D3) 50 MCG (2000 UT) TABS Take 2,000 Units by mouth daily.     Ibuprofen 200 MG CAPS Take 400 mg by mouth every 6 (six) hours as needed (pain).     imatinib (GLEEVEC) 400 MG tablet Take 400 mg by mouth daily. Take  with meals and large glass of water. Caution:Chemotherapy.     pantoprazole (PROTONIX) 40 MG tablet Take 1 tablet (40 mg total) by mouth daily. 90 tablet 3   rosuvastatin (CRESTOR) 10 MG tablet Take 10 mg by mouth daily.     Ascorbic Acid (VITAMIN C) 100 MG tablet Take 100 mg by mouth daily. (Patient not taking: Reported on 07/09/2020)     No current facility-administered medications for this visit.    PHYSICAL EXAMINATION: ECOG PERFORMANCE STATUS: 0 - Asymptomatic  Vitals:   07/09/20 1305  BP: 132/72  Pulse: 65  Resp: 18  Temp: 98.3 F (36.8 C)  SpO2: 100%   Filed Weights   07/09/20 1305  Weight: 161 lb 11.2 oz (73.3 kg)   Physical Exam Vitals reviewed.  Constitutional:      Appearance: Normal appearance. She is obese.  Cardiovascular:     Rate and Rhythm: Normal rate and regular rhythm.     Pulses: Normal pulses.     Heart sounds: Normal heart sounds.  Pulmonary:     Effort: Pulmonary effort is normal.     Breath sounds: Normal breath sounds.  Chest:  Breasts:    Right: No axillary adenopathy or supraclavicular adenopathy.     Left: No axillary adenopathy or supraclavicular adenopathy.  Abdominal:     Palpations: Abdomen is soft. There is no hepatomegaly, splenomegaly or mass.     Tenderness: There is no abdominal tenderness.     Hernia: No hernia is present.  Musculoskeletal:     Right lower leg: No edema.     Left lower leg: No edema.  Lymphadenopathy:     Cervical: No cervical adenopathy.     Upper Body:     Right upper body: No supraclavicular, axillary or pectoral adenopathy.     Left upper body: No supraclavicular, axillary or pectoral adenopathy.  Neurological:     General: No focal deficit present.     Mental Status: She is alert and oriented to person, place, and time.  Psychiatric:        Mood and Affect: Mood normal.        Behavior: Behavior normal.     LABORATORY DATA:  I have reviewed the data as listed CBC Latest Ref Rng & Units 04/23/2020  03/31/2020 03/11/2020  WBC 4.0 - 10.5 K/uL 15.3(H) 9.6 11.8(H)  Hemoglobin 12.0 - 15.0 g/dL 13.4 12.8 11.9(L)  Hematocrit 36.0 - 46.0 % 40.3 38.2 36.3  Platelets 150 - 400 K/uL 261 228 219   CMP Latest Ref Rng & Units 05/20/2020 05/14/2020 04/23/2020  Glucose 70 - 99 mg/dL - - 78  BUN 8 - 23 mg/dL - - 14  Creatinine 0.44 - 1.00 mg/dL - - 0.74  Sodium 135 - 145 mmol/L - - 142  Potassium 3.5 - 5.1 mmol/L - - 3.7  Chloride 98 - 111 mmol/L - - 105  CO2 22 - 32 mmol/L - - 29  Calcium 8.9 - 10.3 mg/dL - - 9.5  Total Protein 6.5 - 8.1 g/dL 6.0(L) 6.1(L) 6.7  Total Bilirubin 0.3 - 1.2 mg/dL 0.8 0.7 0.8  Alkaline Phos 38 - 126 U/L 59 49 59  AST 15 - 41 U/L _0 ALT 0 - 44 U/L 20 20 33    RADIOGRAPHIC STUDIES: I have personally reviewed the radiological images as listed and agreed with the findings in the report.  ASSESSMENT:   1.  Multifocal gastric GIST tumors:  -CT CAP on 05/30/2019 at [facility] in [place] shows 4 x 2 x 2 cm mass in fundus with no metastatic disease.   6 mm solitary pulmonary nodule in the left upper lobe. -On 06/19/2019-partial gastrectomy x2, hiatal hernia repair, esophageal myotomy -Pathology shows 2 gastrointestinal stromal tumors, smaller lesion measuring 0.7 cm and larger lesion measuring 4.7 cm, mitotic rate less than 5/78m2 for both lesions, low-grade, very low risk, no lymph nodes found (PT 1 and PT 2 N0). -[facility] 400 mg daily.  2.  Right breast atypical ductal hyperplasia: -Status post biopsy on 09/06/2019 of indeterminate microcalcifications 7-8:00 consistent with atypical ductal hyperplasia.   PLAN:   1.  Multifocal gastric GIST tumors:  Personally reviewed outside medical records which showed multifocal GIST, considered to be very low risk of recurrence based on mitotic rate, size and location of the GIST tumor, typically does not qualify  for adjuvant imatinib.  She tells me that she is part of a clinical trial and hence started on imatinib for 3 years.  Her most recent imaging is without any concern for recurrence. Since she is technically not part of the trial anymore, We dont believe she needs to take adjuvant imatinib. However patient believes she needs it because this can reduce her risk of recurrence from 5 to 2% as prescribed by his other oncologist. I tried to explain it to her that this is not standard of care. She would still like to keep taking it, refilled today. The 6 mm left upper lobe lung nodule was also mentioned on the previous scans, this can be monitored.  2.  With regards to the right breast atypical ductal hyperplasia, discussed about tamoxifen for breast cancer prevention.   I ordered repeat mammogram bilateral due Aug Then, upon review of records, I didn't notice that there is an excisional biopsy done on the right breast.  Referral  will be made to breast surgery.Sent message to our NN MLilia Proabout the referral.  3.  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   PBenay PikeMD

## 2020-08-06 ENCOUNTER — Ambulatory Visit: Payer: Medicare Other | Admitting: General Surgery

## 2020-08-13 ENCOUNTER — Other Ambulatory Visit: Payer: Self-pay

## 2020-08-13 ENCOUNTER — Encounter: Payer: Self-pay | Admitting: General Surgery

## 2020-08-13 ENCOUNTER — Ambulatory Visit (INDEPENDENT_AMBULATORY_CARE_PROVIDER_SITE_OTHER): Payer: Medicare Other | Admitting: General Surgery

## 2020-08-13 VITALS — BP 110/57 | HR 81 | Temp 98.6°F | Resp 18 | Ht <= 58 in | Wt 164.0 lb

## 2020-08-13 DIAGNOSIS — N6091 Unspecified benign mammary dysplasia of right breast: Secondary | ICD-10-CM | POA: Diagnosis not present

## 2020-08-13 DIAGNOSIS — N6092 Unspecified benign mammary dysplasia of left breast: Secondary | ICD-10-CM

## 2020-08-14 NOTE — Progress Notes (Signed)
Cassandra Fowler; [id number]; 23-Nov-1945   HPI Patient is a 75 year old white female who was referred to my care by oncology for treatment of an atypical ductal hyperplasia of the right breast.  This was found on biopsy in [place] in August of last year.  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  She denies any family history of breast cancer.  She does not feel a lump. 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   CATARACT EXTRACTION Right 03/28/2017   [facility]   CATARACT EXTRACTION Left 03/14/2017   [facility]   COLONOSCOPY  01/28/2013   Dr. Ok Edwards, [place], [place]; Hyperplastic polyp x2, sessile serrated adenoma x2, tubular adenoma with low-grade dysplasia x1.  Recommend repeat colonoscopy in 3 years.   COLONOSCOPY WITH PROPOFOL N/A 04/02/2020   Surgeon: Daneil Dolin, MD; Diverticulosis in the sigmoid and descending colon, cecal AVM, three 5-8 millimeters polyps in the rectum, mid rectum, and ascending colon resected and retrieved.  Pathology with 2 tubular adenomas, 1 hyperplastic polyp.  Recommended repeat colonoscopy in 5 years if health permits.   ESOPHAGOGASTRODUODENOSCOPY  04/24/2019   [place]; 2.4 cm  gastric submucosal mass in the fundus consistent with GIST.  FNA consistent with GIST.   ESOPHAGOGASTRODUODENOSCOPY  01/28/2013   Dr. Gwen Her Devers in [place], [place]; normal esophagus, hernia at GE junction, normal examined stomach, normal examined duodenum.  Does not appear dilation was performed.   ESOPHAGOGASTRODUODENOSCOPY (EGD) WITH PROPOFOL N/A 04/02/2020   Surgeon: Daneil Dolin, MD;  Erosive reflux esophagitis, somewhat dilated distal esophagus s/p 60 French Maloney dilation, stigmata of prior gastric surgery, no evidence of recurrent/persisting GIST, normal examined duodenum.    GIST tumor resection  06/19/2019   [facility], robotic assisted laparoscopic partial gastrectomy x2.  Pathology consistent with GIST   HELLER MYOTOMY  06/19/2019   [facility]; esophageal myotomy with hiatal hernia repair and dor fundoplication   left hand surgery     MALONEY DILATION N/A 04/02/2020   Procedure: Venia Minks DILATION;  Surgeon: Daneil Dolin, MD;  [facility];  Service: Endoscopy;  Laterality: N/A;   POLYPECTOMY  04/02/2020   Procedure: POLYPECTOMY;  Surgeon: Daneil Dolin, MD;  [facility];  Service: Endoscopy;;   tubal ligation     UMBILICAL HERNIA REPAIR     1970s   UPPER ESOPHAGEAL ENDOSCOPIC ULTRASOUND (EUS)  04/24/2019   [facility]; endoscopic findings: Tortuous esophagus s/p balloon dilation to 18 mm with no effect s/p biopsy, submucosal mass in the fundus, normal duodenum; endosonographic findings: 2.4 cm x 2.4 cm submucosal mass in the fundus s/p FNA, cholelithiasis, no hepatic, CBD, or pancreatic abnormalities.  Pathology: GIST, hepatic parenchyma with  mild steato-fibrosis, reflux esophagitis   YAG laser posterior capsulotomy Left 08/30/2018   [facility], left eye.    Family History  Problem Relation Age of Onset   Cancer Mother        d. 30 "where the stomach meets the intestine"  "apple-core"   Lung cancer Brother 95       smoker   Colon cancer Paternal Grandmother        dx in her 47s   Breast cancer Maternal Aunt        unknown age of diagnosis   Vaginal cancer Daughter        dx <53   Uterine cancer Daughter        dx <53   Breast cancer Daughter        dx <53   Breast cancer Maternal Aunt        unknown age of diagnosis   Breast cancer Cousin        unknown age of diagnosis (maternal first cousin)   Breast cancer Cousin        unknown age of diagnosis (maternal first cousin)    Current Outpatient Medications on File Prior to Visit  Medication Sig Dispense Refill   acetaminophen (TYLENOL) 500 MG tablet Take 500 mg by mouth every 6 (six) hours as needed for moderate pain.     baclofen 5 MG TABS Take 5 mg by mouth 2 (two) times daily. (Patient taking differently: Take 10 mg by mouth 2 (two) times daily.) 30 each 0   calcium carbonate (OSCAL) 1500 (600 Ca) MG TABS tablet Take 1,500 mg by mouth daily with breakfast.     Cholecalciferol (D3) 50 MCG (2000 UT) TABS Take 2,000 Units by mouth daily.     Ibuprofen 200 MG CAPS Take 400 mg by mouth every 6 (six) hours as needed (pain).     imatinib (GLEEVEC) 400 MG tablet Take 1 tablet (400 mg total) by mouth daily. Take with meals and large glass of water. Caution:Chemotherapy. 30 tablet 1   pantoprazole (PROTONIX) 40 MG tablet Take 1 tablet (40 mg total) by mouth daily. 90 tablet 3   rosuvastatin (CRESTOR) 10 MG tablet Take 10 mg by mouth daily.     Ascorbic Acid (VITAMIN C) 100 MG tablet Take 100 mg by mouth daily. (Patient not taking: No sig reported)     No current facility-administered medications on file prior to visit.    Allergies  Allergen Reactions   Tape Rash   Indomethacin Hives   Tolectin [Tolmetin]     Pt can not recall   Penicillins Hives and Swelling   Wound Dressing Adhesive Rash    Adhesive tape     Social History   Substance and Sexual Activity  Alcohol Use Yes   Comment: glass  of wine occ    Social History   Tobacco Use  Smoking Status Every Day   Packs/day: 1.00   Years: 47.00   Pack years: 47.00   Types: Cigarettes  Smokeless Tobacco Never    Review of Systems  Constitutional:  Positive for malaise/fatigue.  HENT:  Positive for sinus pain.   Eyes:  Positive for blurred vision and double vision.  Respiratory:  Positive for cough, shortness of breath and wheezing.   Cardiovascular: Negative.   Gastrointestinal:  Positive for heartburn.  Genitourinary:  Positive for frequency.  Musculoskeletal:  Positive for back pain and joint pain.  Skin: Negative.   Neurological:  Positive for sensory change.  Endo/Heme/Allergies: Negative.   Psychiatric/Behavioral: Negative.     Objective   Vitals:   08/13/20 1134  BP: (!) 110/57  Pulse: 81  Resp: 18  Temp: 98.6 F (37 C)  SpO2: 95%    Physical Exam Vitals reviewed. Exam conducted with a chaperone present.  Constitutional:      Appearance: Normal appearance. She is not ill-appearing.  HENT:     Head: Normocephalic and atraumatic.  Cardiovascular:     Rate and Rhythm: Normal rate and regular rhythm.     Heart sounds: Normal heart sounds. No murmur heard.   No friction rub. No gallop.  Pulmonary:     Effort: Pulmonary effort is normal. No respiratory distress.     Breath sounds: Normal breath sounds. No stridor. No wheezing, rhonchi or rales.  Musculoskeletal:     Cervical back: Normal range of motion and neck supple.  Lymphadenopathy:     Cervical: No cervical adenopathy.  Skin:    General: Skin is warm and dry.  Neurological:     Mental Status: She is alert and oriented to person, place, and time.  Breast: No dominant mass, nipple discharge, or dimpling noted in either breast.  Axilla is negative for palpable nodes.  Outside mammogram and pathology reports were reviewed  Assessment  Atypical ductal hyperplasia of right breast Plan  We will schedule patient for a right breast biopsy  after radiofrequency tag placement.  The risks and benefits of the procedure including bleeding, infection, and the possibility of malignancy were fully explained to the patient, who gave informed consent.

## 2020-08-17 ENCOUNTER — Inpatient Hospital Stay
Admission: RE | Admit: 2020-08-17 | Discharge: 2020-08-17 | Disposition: A | Discharge status: Home / Self Care | Payer: Self-pay | Source: Ambulatory Visit | Attending: General Surgery | Admitting: General Surgery

## 2020-08-17 ENCOUNTER — Other Ambulatory Visit (HOSPITAL_COMMUNITY): Payer: Self-pay | Admitting: General Surgery

## 2020-08-17 DIAGNOSIS — R921 Mammographic calcification found on diagnostic imaging of breast: Secondary | ICD-10-CM

## 2020-09-01 ENCOUNTER — Ambulatory Visit (HOSPITAL_COMMUNITY)
Admission: RE | Admit: 2020-09-01 | Discharge: 2020-09-01 | Disposition: A | Discharge status: Home / Self Care | Payer: Medicare Other | Source: Ambulatory Visit | Attending: General Surgery | Admitting: General Surgery

## 2020-09-01 ENCOUNTER — Other Ambulatory Visit: Payer: Self-pay

## 2020-09-01 DIAGNOSIS — R921 Mammographic calcification found on diagnostic imaging of breast: Secondary | ICD-10-CM

## 2020-09-03 ENCOUNTER — Telehealth: Payer: Self-pay | Admitting: Internal Medicine

## 2020-09-03 NOTE — Telephone Encounter (Signed)
RECALL FOR ULTRASOUND 

## 2020-09-03 NOTE — Telephone Encounter (Signed)
Recall sent 

## 2020-09-08 ENCOUNTER — Ambulatory Visit (HOSPITAL_COMMUNITY): Payer: Medicare Other | Admitting: Hematology

## 2020-09-09 NOTE — Progress Notes (Signed)
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 The report date is 05/09/2020  The Multi-Cancer + RNA Panel offered by [facility] includes sequencing and/or deletion/duplication analysis of the following 84 genes:  AIP*, ALK, APC*, ATM*, AXIN2*, BAP1*, BARD1*, BLM*, BMPR1A*, BRCA1*, BRCA2*, BRIP1*, CASR, CDC73*, CDH1*, CDK4, CDKN1B*, CDKN1C*, CDKN2A, CEBPA, CHEK2*, CTNNA1*, DICER1*, DIS3L2*, EGFR, EPCAM, FH*, FLCN*, GATA2*, GPC3, GREM1, HOXB13, HRAS, KIT, MAX*, MEN1*, MET, MITF, MLH1*, MSH2*, MSH3*, MSH6*, MUTYH*, NBN*, NF1*, NF2*, NTHL1*, PALB2*, PDGFRA, PHOX2B, PMS2*, POLD1*, POLE*, POT1*, PRKAR1A*, PTCH1*, PTEN*, RAD50*, RAD51C*, RAD51D*, RB1*, RECQL4, RET, RUNX1*, SDHA*, SDHAF2*, SDHB*, SDHC*, SDHD*, SMAD4*, SMARCA4*, SMARCB1*, SMARCE1*, STK11*, SUFU*, TERC, TERT, TMEM127*, Tp53*, TSC1*, TSC2*, VHL*, WRN*, and WT1.  RNA analysis is performed for * genes.       CANCER STAGING: Cancer Staging No matching staging information was found for the patient.  INTERVAL HISTORY:  Ms. Yulonda Wheeling, a 75 y.o. 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 Kaly was last seen on 03/05/2020.   Today  she reports feeling good. She is still taking Gleevec and has been taking it since June. She denies fatigue, n/v/d, and she reports occasional trouble swallowing solids and liquids.  REVIEW OF SYSTEMS:  Review of Systems  Constitutional:  Negative for appetite change (80%) and fatigue (75%).  HENT:   Positive for trouble swallowing.   Respiratory:  Positive for cough.   Gastrointestinal:  Positive for vomiting (occasional). Negative for diarrhea and nausea.  Neurological:  Positive for headaches (sinus) and numbness (fingers).  Psychiatric/Behavioral:  Positive for sleep disturbance.   All other systems reviewed and are negative.  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   CATARACT EXTRACTION Right 03/28/2017   [facility]   CATARACT EXTRACTION Left 03/14/2017   [facility]   COLONOSCOPY  01/28/2013   Dr. Ok Edwards, [place], [place]; Hyperplastic polyp x2, sessile serrated adenoma x2, tubular adenoma with low-grade dysplasia x1.  Recommend repeat colonoscopy in 3 years.   COLONOSCOPY WITH PROPOFOL N/A 04/02/2020  Surgeon: Daneil Dolin, MD; Diverticulosis in the sigmoid and descending colon, cecal AVM, three 5-8 millimeters polyps in  the rectum, mid rectum, and ascending colon resected and retrieved.  Pathology with 2 tubular adenomas, 1 hyperplastic polyp.  Recommended repeat colonoscopy in 5 years if health permits.   ESOPHAGOGASTRODUODENOSCOPY  04/24/2019   [place]; 2.4 cm gastric submucosal mass in the fundus consistent with GIST.  FNA consistent with GIST.   ESOPHAGOGASTRODUODENOSCOPY  01/28/2013   Dr. Gwen Her Devers in [place], [place]; normal esophagus, hernia at GE junction, normal examined stomach, normal examined duodenum.  Does not appear dilation was performed.   ESOPHAGOGASTRODUODENOSCOPY (EGD) WITH PROPOFOL N/A 04/02/2020   Surgeon: Daneil Dolin, MD;  Erosive reflux esophagitis, somewhat dilated distal esophagus s/p 60 French Maloney dilation, stigmata of prior gastric surgery, no evidence of recurrent/persisting GIST, normal examined duodenum.    GIST tumor resection  06/19/2019   [facility], robotic assisted laparoscopic partial gastrectomy x2.  Pathology consistent with GIST   HELLER MYOTOMY  06/19/2019   [facility]; esophageal myotomy with hiatal hernia repair and dor fundoplication   left hand surgery     MALONEY DILATION N/A 04/02/2020   Procedure: Venia Minks DILATION;  Surgeon: Daneil Dolin, MD;  [facility];  Service: Endoscopy;  Laterality: N/A;   POLYPECTOMY  04/02/2020   Procedure: POLYPECTOMY;  Surgeon: Daneil Dolin, MD;  Location: [facility];  Service: Endoscopy;;   tubal ligation     UMBILICAL HERNIA REPAIR     1970s   UPPER ESOPHAGEAL ENDOSCOPIC ULTRASOUND (EUS)  04/24/2019   [facility]; endoscopic findings: Tortuous esophagus s/p balloon dilation to 18 mm with no effect s/p biopsy, submucosal mass in the fundus, normal duodenum; endosonographic findings: 2.4 cm x 2.4 cm submucosal mass in the fundus s/p FNA, cholelithiasis, no hepatic, CBD, or pancreatic abnormalities.  Pathology: GIST, hepatic parenchyma with mild  steato-fibrosis, reflux esophagitis   YAG laser posterior capsulotomy Left 08/30/2018   [facility], left eye.    SOCIAL HISTORY:  Social History   Socioeconomic History   Marital status: Single    Spouse name: Not on file   Number of children: Not on file   Years of education: Not on file   Highest education level: Not on file  Occupational History   Occupation: retired  Tobacco Use   Smoking status: Every Day    Packs/day: 1.00    Years: 47.00    Pack years: 47.00    Types: Cigarettes   Smokeless tobacco: Never  Vaping Use   Vaping Use: Never used  Substance and Sexual Activity   Alcohol use: Yes    Comment: glass of wine occ   Drug use: Never   Sexual activity: Not on file  Other Topics Concern   Not on file  Social History Narrative   Not on file   Social Determinants of Health   Financial Resource Strain: Not on file  Food Insecurity: Not on file  Transportation Needs: No Transportation Needs   Lack of Transportation (Medical): No   Lack of Transportation (Non-Medical): No  Physical Activity: Inactive   Days of Exercise per Week: 0 days   Minutes of Exercise per Session: 0 min  Stress: Not on file  Social Connections: Not on file  Intimate Partner Violence: Not At Risk   Fear of Current or Ex-Partner: No   Emotionally Abused: No   Physically Abused: No   Sexually Abused: No  FAMILY HISTORY:  Family History  Problem Relation Age of Onset   Cancer Mother        d. 20 "where the stomach meets the intestine" "apple-core"   Lung cancer Brother 70       smoker   Colon cancer Paternal Grandmother        dx in her 14s   Breast cancer Maternal Aunt        unknown age of diagnosis   Vaginal cancer Daughter        dx <53   Uterine cancer Daughter        dx <53   Breast cancer Daughter        dx <53   Breast cancer Maternal Aunt        unknown age of diagnosis   Breast cancer Cousin        unknown age of diagnosis (maternal first  cousin)   Breast cancer Cousin        unknown age of diagnosis (maternal first cousin)    CURRENT MEDICATIONS:  Current Outpatient Medications  Medication Sig Dispense Refill   acetaminophen (TYLENOL) 500 MG tablet Take 500 mg by mouth every 6 (six) hours as needed for moderate pain.     Ascorbic Acid (VITAMIN C) 100 MG tablet Take 100 mg by mouth daily. (Patient not taking: No sig reported)     baclofen 5 MG TABS Take 5 mg by mouth 2 (two) times daily. (Patient taking differently: Take 10 mg by mouth 2 (two) times daily.) 30 each 0   calcium carbonate (OSCAL) 1500 (600 Ca) MG TABS tablet Take 1,500 mg by mouth daily with breakfast.     Cholecalciferol (D3) 50 MCG (2000 UT) TABS Take 2,000 Units by mouth daily.     Ibuprofen 200 MG CAPS Take 400 mg by mouth every 6 (six) hours as needed (pain).     imatinib (GLEEVEC) 400 MG tablet Take 1 tablet (400 mg total) by mouth daily. Take with meals and large glass of water. Caution:Chemotherapy. 30 tablet 1   pantoprazole (PROTONIX) 40 MG tablet Take 1 tablet (40 mg total) by mouth daily. 90 tablet 3   rosuvastatin (CRESTOR) 10 MG tablet Take 10 mg by mouth daily.     No current facility-administered medications for this visit.    ALLERGIES:  Allergies  Allergen Reactions   Tape Rash   Indomethacin Hives   Tolectin [Tolmetin]     Pt can not recall   Penicillins Hives and Swelling   Wound Dressing Adhesive Rash    Adhesive tape     PHYSICAL EXAM:  Performance status (ECOG): 0 - Asymptomatic  There were no vitals filed for this visit. Wt Readings from Last 3 Encounters:  08/13/20 164 lb (74.4 kg)  07/09/20 161 lb 11.2 oz (73.3 kg)  06/05/20 168 lb 11.2 oz (76.5 kg)   Physical Exam Vitals reviewed.  Constitutional:      Appearance: Normal appearance.  Cardiovascular:     Rate and Rhythm: Normal rate and regular rhythm.     Pulses: Normal pulses.     Heart sounds: Normal heart sounds.  Pulmonary:     Effort: Pulmonary effort  is normal.     Breath sounds: Normal breath sounds.  Neurological:     General: No focal deficit present.     Mental Status: She is alert and oriented to person, place, and time.  Psychiatric:        Mood and Affect: Mood normal.  Behavior: Behavior normal.     LABORATORY DATA:  I have reviewed the labs as listed.  CBC Latest Ref Rng & Units 04/23/2020 03/31/2020 03/11/2020  WBC 4.0 - 10.5 K/uL 15.3(H) 9.6 11.8(H)  Hemoglobin 12.0 - 15.0 g/dL 13.4 12.8 11.9(L)  Hematocrit 36.0 - 46.0 % 40.3 38.2 36.3  Platelets 150 - 400 K/uL 261 228 219   CMP Latest Ref Rng & Units 05/20/2020 05/14/2020 04/23/2020  Glucose 70 - 99 mg/dL - - 78  BUN 8 - 23 mg/dL - - 14  Creatinine 0.44 - 1.00 mg/dL - - 0.74  Sodium 135 - 145 mmol/L - - 142  Potassium 3.5 - 5.1 mmol/L - - 3.7  Chloride 98 - 111 mmol/L - - 105  CO2 22 - 32 mmol/L - - 29  Calcium 8.9 - 10.3 mg/dL - - 9.5  Total Protein 6.5 - 8.1 g/dL 6.0(L) 6.1(L) 6.7  Total Bilirubin 0.3 - 1.2 mg/dL 0.8 0.7 0.8  Alkaline Phos 38 - 126 U/L 59 49 59  AST 15 - 41 U/L 19 15 20   ALT 0 - 44 U/L 20 20 33    DIAGNOSTIC IMAGING:  I have independently reviewed the scans and discussed with the patient. MM DIAG BREAST TOMO BILATERAL  Result Date: 09/01/2020 CLINICAL DATA:  75 year old female with atypical ductal hyperplasia at site of cylindrical shaped biopsy marking clip in the slightly outer right breast following stereotactic guided biopsy of calcifications 09/06/2019. Patient plans to have this area excised the near future. EXAM: DIGITAL DIAGNOSTIC BILATERAL MAMMOGRAM WITH TOMOSYNTHESIS AND CAD TECHNIQUE: Bilateral digital diagnostic mammography and breast tomosynthesis was performed. The images were evaluated with computer-aided detection. COMPARISON:  Previous exams. ACR Breast Density Category b: There are scattered areas of fibroglandular density. FINDINGS: No suspicious masses or calcifications are seen in either breast. A cylindrical shaped biopsy  marking clip is present at the site of biopsy proven atypical ductal hyperplasia in the slightly outer right breast. No new calcifications identified. There is no mammographic evidence of malignancy in either breast. IMPRESSION: Cylindrical shaped biopsy marking clip at site biopsy proven atypical ductal hyperplasia in the right breast. There is no mammographic evidence malignancy in either breast. RECOMMENDATION: 1. Recommend excision of biopsy proven atypical ductal hyperplasia in the right breast. 2.  Screening mammogram in one year.(Code:SM-B-01Y) I have discussed the findings and recommendations with the patient. If applicable, a reminder letter will be sent to the patient regarding the next appointment. BI-RADS CATEGORY  2: Benign. Electronically Signed   By: Everlean Alstrom M.D.   On: 09/01/2020 10:18     ASSESSMENT:  1.  Multifocal gastric GIST tumors: -CT CAP on 05/30/2019 at [facility] in [place] shows 4 x 2 x 2 cm mass in fundus with no metastatic disease.  6 mm solitary pulmonary nodule in the left upper lobe. -On 06/19/2019-partial gastrectomy x2, hiatal hernia repair, esophageal myotomy -Pathology shows 2 gastrointestinal stromal tumors, smaller lesion measuring 0.7 cm and larger lesion measuring 4.7 cm, mitotic rate less than 5/25m2 for both lesions, low-grade, very low risk, no lymph nodes found (PT 1 and PT 2 N0). -[facility] 400 mg daily. - She appears to be in the very low risk range with a 1.9% metastatic rate.  This prognostic assessment applies best to KIT negative or PDGFR positive GIST's, whereas SDH deficient GIST's are more unpredictable.  Unsure why she was started  on imatinib.  Was possible that she was part of clinical trial.   2.  Social/family history: -She is a retired [employer] manager.  She quit smoking 3 years ago, smoked 1 pack/day. -Mother had colon cancer.  Brother had  lung cancer.  2 maternal aunts had breast cancer.  Paternal grandmother had colon cancer.  Daughter had breast cancer, vaginal and uterine cancers.   3.  Right breast atypical ductal hyperplasia: -Status post biopsy on 09/06/2019 of indeterminate microcalcifications 7-8:00 consistent with atypical ductal hyperplasia.   PLAN:  1.  Multifocal gastric GIST tumors: - CT CAP from 05/20/2020 reviewed by me did not show any acute findings in the chest, abdomen or pelvis.  No residual or recurrent tumor or metastatic disease.  6 mm left upper lobe lung nodule identified. - Based on her very low risk range and low percent metastatic rate, I have recommended against continuing imatinib. - She is agreeable and will complete her current bottle and discontinue it. - RTC 3 months with repeat labs and scan.  2.  Right breast atypical ductal hyperplasia: - She never had excision of the ADH in the right breast. - Reviewed mammogram from 09/01/2020 which showed biopsy clip at biopsy-proven site of ADH in the right breast.  No other evidence of malignancy. - She was told to follow-up with Dr. Arnoldo Morale for excision.  3.  Left upper lobe lung nodule: - We will consider repeat imaging in 6 months from the last to follow-up on it.   Orders placed this encounter:  No orders of the defined types were placed in this encounter.    Derek Jack, MD [place] [phone]   I, Thana Ates, am acting as a scribe for Dr. Derek Jack.  I, Derek Jack MD, have reviewed the above documentation for accuracy and completeness, and I agree with the above.

## 2020-09-10 ENCOUNTER — Telehealth: Payer: Self-pay | Admitting: Internal Medicine

## 2020-09-10 ENCOUNTER — Other Ambulatory Visit: Payer: Self-pay

## 2020-09-10 ENCOUNTER — Inpatient Hospital Stay (HOSPITAL_COMMUNITY): Payer: Medicare Other | Attending: Hematology and Oncology | Admitting: Hematology

## 2020-09-10 VITALS — BP 87/69 | HR 67 | Temp 97.5°F | Resp 20 | Wt 166.2 lb

## 2020-09-10 DIAGNOSIS — C49A4 Gastrointestinal stromal tumor of large intestine: Secondary | ICD-10-CM | POA: Diagnosis not present

## 2020-09-10 DIAGNOSIS — F17210 Nicotine dependence, cigarettes, uncomplicated: Secondary | ICD-10-CM | POA: Diagnosis not present

## 2020-09-10 DIAGNOSIS — N6091 Unspecified benign mammary dysplasia of right breast: Secondary | ICD-10-CM | POA: Insufficient documentation

## 2020-09-10 DIAGNOSIS — Z800 Family history of malignant neoplasm of digestive organs: Secondary | ICD-10-CM | POA: Insufficient documentation

## 2020-09-10 DIAGNOSIS — C49A2 Gastrointestinal stromal tumor of stomach: Secondary | ICD-10-CM | POA: Insufficient documentation

## 2020-09-10 DIAGNOSIS — Z801 Family history of malignant neoplasm of trachea, bronchus and lung: Secondary | ICD-10-CM | POA: Insufficient documentation

## 2020-09-10 DIAGNOSIS — R911 Solitary pulmonary nodule: Secondary | ICD-10-CM | POA: Insufficient documentation

## 2020-09-10 DIAGNOSIS — Z79899 Other long term (current) drug therapy: Secondary | ICD-10-CM | POA: Diagnosis not present

## 2020-09-10 DIAGNOSIS — Z803 Family history of malignant neoplasm of breast: Secondary | ICD-10-CM | POA: Insufficient documentation

## 2020-09-10 DIAGNOSIS — Z8509 Personal history of malignant neoplasm of other digestive organs: Secondary | ICD-10-CM

## 2020-09-10 DIAGNOSIS — K824 Cholesterolosis of gallbladder: Secondary | ICD-10-CM

## 2020-09-10 NOTE — Telephone Encounter (Signed)
[place] abd RUQ scheduled for 09/18/20 at 9:30am, arrive at 9:15am. NPO after midnight prior to test.  Called and informed pt of [place] appt. Letter mailed.

## 2020-09-10 NOTE — Telephone Encounter (Signed)
PATIENT RECEIVED LETTER TO SCHEDULE ULTRASOUND 

## 2020-09-10 NOTE — Patient Instructions (Addendum)
[place] at [facility] Discharge Instructions  You were seen today by Dr. Delton Coombes. He went over your recent results and scans. Finish your last bottle of Gleevec, and then you may stop taking this medication. You will be referred to Dr. Arnoldo Morale to have the clip in your breast removed. You will be scheduled for a CT scan of your chest, abdomen, and pelvis prior to your next visit. Dr. Delton Coombes will see you back in 3 months for labs and follow up.   Thank you for choosing [place] [facility] to provide your oncology and hematology care.  To afford each patient quality time with our provider, please arrive at least 15 minutes before your scheduled appointment time.   If you have a lab appointment with the [place] please come in thru the Main Entrance and check in at the main information desk  You need to re-schedule your appointment should you arrive 10 or more minutes late.  We strive to give you quality time with our providers, and arriving late affects you and other patients whose appointments are after yours.  Also, if you no show three or more times for appointments you may be dismissed from the clinic at the providers discretion.     Again, thank you for choosing [facility].  Our hope is that these requests will decrease the amount of time that you wait before being seen by our physicians.       _____________________________________________________________  Should you have questions after your visit to [facility], please contact our office at [phone] between the hours of 8:00 a.m. and 4:30 p.m.  Voicemails left after 4:00 p.m. will not be returned until the following business day.  For prescription refill requests, have your pharmacy contact our office and allow 72 hours.    Cancer Center Support Programs:   > Cancer Support Group  2nd Tuesday of the month 1pm-2pm, Journey Room

## 2020-09-10 NOTE — Addendum Note (Signed)
Addended by: Hassan Rowan on: 09/10/2020 11:33 AM   Modules accepted: Orders

## 2020-09-16 ENCOUNTER — Other Ambulatory Visit (HOSPITAL_COMMUNITY): Payer: Self-pay | Admitting: General Surgery

## 2020-09-16 DIAGNOSIS — R928 Other abnormal and inconclusive findings on diagnostic imaging of breast: Secondary | ICD-10-CM

## 2020-09-18 ENCOUNTER — Ambulatory Visit (HOSPITAL_COMMUNITY)
Admission: RE | Admit: 2020-09-18 | Discharge: 2020-09-18 | Disposition: A | Discharge status: Home / Self Care | Payer: Medicare Other | Source: Ambulatory Visit | Attending: Gastroenterology | Admitting: Gastroenterology

## 2020-09-18 ENCOUNTER — Other Ambulatory Visit: Payer: Self-pay

## 2020-09-18 DIAGNOSIS — K824 Cholesterolosis of gallbladder: Secondary | ICD-10-CM | POA: Diagnosis present

## 2020-09-18 NOTE — H&P (Signed)
Cassandra Fowler; [id number]; July 13, 1945   HPI Patient is a 75 year old white female who was referred to my care by oncology for treatment of an atypical ductal hyperplasia of the right breast.  This was found on biopsy in [place] in August of last year.  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  She denies any family history of breast cancer.  She does not feel a lump. 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   CATARACT EXTRACTION Right 03/28/2017   [facility]   CATARACT EXTRACTION Left 03/14/2017   [facility]   COLONOSCOPY  01/28/2013   Dr. Ok Edwards, [place], [place]; Hyperplastic polyp x2, sessile serrated adenoma x2, tubular adenoma with low-grade dysplasia x1.  Recommend repeat colonoscopy in 3 years.   COLONOSCOPY WITH PROPOFOL N/A 04/02/2020   Surgeon: Daneil Dolin, MD; Diverticulosis in the sigmoid and descending colon, cecal AVM, three 5-8 millimeters polyps in the rectum, mid rectum, and ascending colon resected and retrieved.  Pathology with 2 tubular adenomas, 1 hyperplastic polyp.  Recommended repeat colonoscopy in 5 years if health permits.   ESOPHAGOGASTRODUODENOSCOPY  04/24/2019   [place]; 2.4 cm  gastric submucosal mass in the fundus consistent with GIST.  FNA consistent with GIST.   ESOPHAGOGASTRODUODENOSCOPY  01/28/2013   Dr. Gwen Her Devers in [place], [place]; normal esophagus, hernia at GE junction, normal examined stomach, normal examined duodenum.  Does not appear dilation was performed.   ESOPHAGOGASTRODUODENOSCOPY (EGD) WITH PROPOFOL N/A 04/02/2020   Surgeon: Daneil Dolin, MD;  Erosive reflux esophagitis, somewhat dilated distal esophagus s/p 60 French Maloney dilation, stigmata of prior gastric surgery, no evidence of recurrent/persisting GIST, normal examined duodenum.    GIST tumor resection  06/19/2019   [facility], robotic assisted laparoscopic partial gastrectomy x2.  Pathology consistent with GIST   HELLER MYOTOMY  06/19/2019   [facility]; esophageal myotomy with hiatal hernia repair and dor fundoplication   left hand surgery     MALONEY DILATION N/A 04/02/2020   Procedure: Venia Minks DILATION;  Surgeon: Daneil Dolin, MD;  Location: [facility];  Service: Endoscopy;  Laterality: N/A;   POLYPECTOMY  04/02/2020   Procedure: POLYPECTOMY;  Surgeon: Daneil Dolin, MD;  Location: [facility];  Service: Endoscopy;;   tubal ligation     UMBILICAL HERNIA REPAIR     1970s   UPPER ESOPHAGEAL ENDOSCOPIC ULTRASOUND (EUS)  04/24/2019   [facility]; endoscopic findings: Tortuous esophagus s/p balloon dilation to 18 mm with no effect s/p biopsy, submucosal mass in the fundus, normal duodenum; endosonographic findings: 2.4 cm x 2.4 cm submucosal mass in the fundus s/p FNA, cholelithiasis, no hepatic, CBD, or pancreatic abnormalities.  Pathology: GIST, hepatic parenchyma with  mild steato-fibrosis, reflux esophagitis   YAG laser posterior capsulotomy Left 08/30/2018   [facility], left eye.    Family History  Problem Relation Age of Onset   Cancer Mother        d. 66 "where the stomach meets the intestine"  "apple-core"   Lung cancer Brother 7       smoker   Colon cancer Paternal Grandmother        dx in her 65s   Breast cancer Maternal Aunt        unknown age of diagnosis   Vaginal cancer Daughter        dx <53   Uterine cancer Daughter        dx <53   Breast cancer Daughter        dx <53   Breast cancer Maternal Aunt        unknown age of diagnosis   Breast cancer Cousin        unknown age of diagnosis (maternal first cousin)   Breast cancer Cousin        unknown age of diagnosis (maternal first cousin)    Current Outpatient Medications on File Prior to Visit  Medication Sig Dispense Refill   acetaminophen (TYLENOL) 500 MG tablet Take 500 mg by mouth every 6 (six) hours as needed for moderate pain.     baclofen 5 MG TABS Take 5 mg by mouth 2 (two) times daily. (Patient taking differently: Take 10 mg by mouth 2 (two) times daily.) 30 each 0   calcium carbonate (OSCAL) 1500 (600 Ca) MG TABS tablet Take 1,500 mg by mouth daily with breakfast.     Cholecalciferol (D3) 50 MCG (2000 UT) TABS Take 2,000 Units by mouth daily.     Ibuprofen 200 MG CAPS Take 400 mg by mouth every 6 (six) hours as needed (pain).     imatinib (GLEEVEC) 400 MG tablet Take 1 tablet (400 mg total) by mouth daily. Take with meals and large glass of water. Caution:Chemotherapy. 30 tablet 1   pantoprazole (PROTONIX) 40 MG tablet Take 1 tablet (40 mg total) by mouth daily. 90 tablet 3   rosuvastatin (CRESTOR) 10 MG tablet Take 10 mg by mouth daily.     Ascorbic Acid (VITAMIN C) 100 MG tablet Take 100 mg by mouth daily. (Patient not taking: No sig reported)     No current facility-administered medications on file prior to visit.    Allergies  Allergen Reactions   Tape Rash   Indomethacin Hives   Tolectin [Tolmetin]     Pt can not recall   Penicillins Hives and Swelling   Wound Dressing Adhesive Rash    Adhesive tape     Social History   Substance and Sexual Activity  Alcohol Use Yes   Comment: glass  of wine occ    Social History   Tobacco Use  Smoking Status Every Day   Packs/day: 1.00   Years: 47.00   Pack years: 47.00   Types: Cigarettes  Smokeless Tobacco Never    Review of Systems  Constitutional:  Positive for malaise/fatigue.  HENT:  Positive for sinus pain.   Eyes:  Positive for blurred vision and double vision.  Respiratory:  Positive for cough, shortness of breath and wheezing.   Cardiovascular: Negative.   Gastrointestinal:  Positive for heartburn.  Genitourinary:  Positive for frequency.  Musculoskeletal:  Positive for back pain and joint pain.  Skin: Negative.   Neurological:  Positive for sensory change.  Endo/Heme/Allergies: Negative.   Psychiatric/Behavioral: Negative.     Objective   Vitals:   08/13/20 1134  BP: (!) 110/57  Pulse: 81  Resp: 18  Temp: 98.6 F (37 C)  SpO2: 95%    Physical Exam Vitals reviewed. Exam conducted with a chaperone present.  Constitutional:      Appearance: Normal appearance. She is not ill-appearing.  HENT:     Head: Normocephalic and atraumatic.  Cardiovascular:     Rate and Rhythm: Normal rate and regular rhythm.     Heart sounds: Normal heart sounds. No murmur heard.   No friction rub. No gallop.  Pulmonary:     Effort: Pulmonary effort is normal. No respiratory distress.     Breath sounds: Normal breath sounds. No stridor. No wheezing, rhonchi or rales.  Musculoskeletal:     Cervical back: Normal range of motion and neck supple.  Lymphadenopathy:     Cervical: No cervical adenopathy.  Skin:    General: Skin is warm and dry.  Neurological:     Mental Status: She is alert and oriented to person, place, and time.  Breast: No dominant mass, nipple discharge, or dimpling noted in either breast.  Axilla is negative for palpable nodes.  Outside mammogram and pathology reports were reviewed  Assessment  Atypical ductal hyperplasia of right breast Plan  We will schedule patient for a right breast biopsy  after radiofrequency tag placement.  The risks and benefits of the procedure including bleeding, infection, and the possibility of malignancy were fully explained to the patient, who gave informed consent.

## 2020-10-05 NOTE — Patient Instructions (Signed)
Cassandra Fowler  10/05/2020     @PREFPERIOPPHARMACY @   Your procedure is scheduled on  10/09/2020.   Report to [facility] at  [phone] A.M.   Call this number if you have problems the morning of surgery:  [phone]   Remember:  Do not eat or drink after midnight.      Take these medicines the morning of surgery with A SIP OF WATER                       baclofen (if needed), protonix.     Do not wear jewelry, make-up or nail polish.  Do not wear lotions, powders, or perfumes, or deodorant.  Do not shave 48 hours prior to surgery.  Men may shave face and neck.  Do not bring valuables to the hospital.  [facility] is not responsible for any belongings or valuables.  Contacts, dentures or bridgework may not be worn into surgery.  Leave your suitcase in the car.  After surgery it may be brought to your room.  For patients admitted to the hospital, discharge time will be determined by your treatment team.  Patients discharged the day of surgery will not be allowed to drive home and must have someone with them for 24 hours.    Special instructions:   DO NOT smoke tobacco or vape for 24 hours before your procedure.  Please read over the following fact sheets that you were given. Coughing and Deep Breathing, Surgical Site Infection Prevention, Anesthesia Post-op Instructions, and Care and Recovery After Surgery      Breast Biopsy, Care After These instructions give you information about caring for yourself after your procedure. Your doctor may also give you more specific instructions. Call your doctor if you have any problems or questions after your procedure. What can I expect after the procedure? After your procedure, it is common to have: Bruising on your breast. Numbness, tingling, or pain near your biopsy site. Follow these instructions at home: Medicines Take over-the-counter and prescription medicines only as told by your doctor. Do not drive for 24  hours if you were given a medicine to help you relax (sedative) during your procedure. Do not drink alcohol while taking pain medicine. Do not drive or use heavy machinery while taking prescription pain medicine. Biopsy site care   Follow instructions from your doctor about how to take care of your cut from surgery (incision) or your puncture area. Make sure you: Wash your hands with soap and water before you change your bandage (dressing). If you cannot use soap and water, use hand sanitizer. Change your bandage as told by your doctor. Leave stitches (sutures), skin glue, or skin tape (adhesive strips) in place. They may need to stay in place for 2 weeks or longer. If tape strips get loose and curl up, you may trim the loose edges. Do not remove tape strips completely unless your doctor says it is okay. If you have stitches, keep them dry when you take a bath or a shower. Check your cut or puncture area every day for signs of infection. Check for: Redness, swelling, or pain. Fluid or blood. Warmth. Pus or a bad smell. Protect the biopsy area. Do not let the area get bumped. Activity If you had a cut during your procedure, avoid activities that could pull your cut open. These include: Stretching. Reaching over your head. Exercise. Sports. Lifting anything that weighs more than  3 lb (1.4 kg). Return to your normal activities as told by your doctor. Ask your doctor what activities are safe for you. Managing pain, stiffness, and swelling If told, put ice on the biopsy site to relieve swelling: Put ice in a plastic bag. Place a towel between your skin and the bag. Leave the ice on for 20 minutes, 2-3 times a day. General instructions Continue your normal diet. Wear a good support bra for as long as told by your doctor. Get checked for extra fluid around your lymph nodes (lymphedema) as often as told by your doctor. Keep all follow-up visits as told by your doctor. This is  important. Contact a doctor if: You notice any of the following at the biopsy site: More redness, swelling, or pain. More fluid or blood coming from the site. The site feels warm to the touch. Pus or a bad smell coming from the site. The site breaks open after the stitches or skin tape strips have been removed. You have a rash. You have a fever. Get help right away if: You have more bleeding from the biopsy site. Get help right away if bleeding is more than a small spot. You have trouble breathing. You have red streaks around the biopsy site. Summary After your procedure, it is common to have bruising, numbness, tingling, or pain near the biopsy site. Do not drive or use heavy machinery while taking prescription pain medicine. Wear a good support bra for as long as told by your doctor. If you had a cut during your procedure, avoid activities that may pull the cut open. Ask your doctor what activities are safe for you. This information is not intended to replace advice given to you by your health care provider. Make sure you discuss any questions you have with your health care provider. Document Revised: 09/09/2019 Document Reviewed: 06/15/2017 Elsevier Patient Education  2022 [place] Anesthesia, Adult, Care After This sheet gives you information about how to care for yourself after your procedure. Your health care provider may also give you more specific instructions. If you have problems or questions, contact your health care provider. What can I expect after the procedure? After the procedure, the following side effects are common: Pain or discomfort at the IV site. Nausea. Vomiting. Sore throat. Trouble concentrating. Feeling cold or chills. Feeling weak or tired. Sleepiness and fatigue. Soreness and body aches. These side effects can affect parts of the body that were not involved in surgery. Follow these instructions at home: For the time period you were told  by your health care provider:  Rest. Do not participate in activities where you could fall or become injured. Do not drive or use machinery. Do not drink alcohol. Do not take sleeping pills or medicines that cause drowsiness. Do not make important decisions or sign legal documents. Do not take care of children on your own. Eating and drinking Follow any instructions from your health care provider about eating or drinking restrictions. When you feel hungry, start by eating small amounts of foods that are soft and easy to digest (bland), such as toast. Gradually return to your regular diet. Drink enough fluid to keep your urine pale yellow. If you vomit, rehydrate by drinking water, juice, or clear broth. General instructions If you have sleep apnea, surgery and certain medicines can increase your risk for breathing problems. Follow instructions from your health care provider about wearing your sleep device: Anytime you are sleeping, including during daytime naps. While taking  prescription pain medicines, sleeping medicines, or medicines that make you drowsy. Have a responsible adult stay with you for the time you are told. It is important to have someone help care for you until you are awake and alert. Return to your normal activities as told by your health care provider. Ask your health care provider what activities are safe for you. Take over-the-counter and prescription medicines only as told by your health care provider. If you smoke, do not smoke without supervision. Keep all follow-up visits as told by your health care provider. This is important. Contact a health care provider if: You have nausea or vomiting that does not get better with medicine. You cannot eat or drink without vomiting. You have pain that does not get better with medicine. You are unable to pass urine. You develop a skin rash. You have a fever. You have redness around your IV site that gets worse. Get help  right away if: You have difficulty breathing. You have chest pain. You have blood in your urine or stool, or you vomit blood. Summary After the procedure, it is common to have a sore throat or nausea. It is also common to feel tired. Have a responsible adult stay with you for the time you are told. It is important to have someone help care for you until you are awake and alert. When you feel hungry, start by eating small amounts of foods that are soft and easy to digest (bland), such as toast. Gradually return to your regular diet. Drink enough fluid to keep your urine pale yellow. Return to your normal activities as told by your health care provider. Ask your health care provider what activities are safe for you. This information is not intended to replace advice given to you by your health care provider. Make sure you discuss any questions you have with your health care provider. Document Revised: 09/12/2019 Document Reviewed: 04/11/2019 Elsevier Patient Education  2022 [place]. How to Use Chlorhexidine for Bathing Chlorhexidine gluconate (CHG) is a germ-killing (antiseptic) solution that is used to clean the skin. It can get rid of the bacteria that normally live on the skin and can keep them away for about 24 hours. To clean your skin with CHG, you may be given: A CHG solution to use in the shower or as part of a sponge bath. A prepackaged cloth that contains CHG. Cleaning your skin with CHG may help lower the risk for infection: While you are staying in the intensive care unit of the hospital. If you have a vascular access, such as a central line, to provide short-term or long-term access to your veins. If you have a catheter to drain urine from your bladder. If you are on a ventilator. A ventilator is a machine that helps you breathe by moving air in and out of your lungs. After surgery. What are the risks? Risks of using CHG include: A skin reaction. Hearing loss, if CHG gets  in your ears and you have a perforated eardrum. Eye injury, if CHG gets in your eyes and is not rinsed out. The CHG product catching fire. Make sure that you avoid smoking and flames after applying CHG to your skin. Do not use CHG: If you have a chlorhexidine allergy or have previously reacted to chlorhexidine. On babies younger than 48 months of age. How to use CHG solution Use CHG only as told by your health care provider, and follow the instructions on the label. Use the full amount  of CHG as directed. Usually, this is one bottle. During a shower Follow these steps when using CHG solution during a shower (unless your health care provider gives you different instructions): Start the shower. Use your normal soap and shampoo to wash your face and hair. Turn off the shower or move out of the shower stream. Pour the CHG onto a clean washcloth. Do not use any type of brush or rough-edged sponge. Starting at your neck, lather your body down to your toes. Make sure you follow these instructions: If you will be having surgery, pay special attention to the part of your body where you will be having surgery. Scrub this area for at least 1 minute. Do not use CHG on your head or face. If the solution gets into your ears or eyes, rinse them well with water. Avoid your genital area. Avoid any areas of skin that have broken skin, cuts, or scrapes. Scrub your back and under your arms. Make sure to wash skin folds. Let the lather sit on your skin for 1-2 minutes or as long as told by your health care provider. Thoroughly rinse your entire body in the shower. Make sure that all body creases and crevices are rinsed well. Dry off with a clean towel. Do not put any substances on your body afterward--such as powder, lotion, or perfume--unless you are told to do so by your health care provider. Only use lotions that are recommended by the manufacturer. Put on clean clothes or pajamas. If it is the night before  your surgery, sleep in clean sheets.  During a sponge bath Follow these steps when using CHG solution during a sponge bath (unless your health care provider gives you different instructions): Use your normal soap and shampoo to wash your face and hair. Pour the CHG onto a clean washcloth. Starting at your neck, lather your body down to your toes. Make sure you follow these instructions: If you will be having surgery, pay special attention to the part of your body where you will be having surgery. Scrub this area for at least 1 minute. Do not use CHG on your head or face. If the solution gets into your ears or eyes, rinse them well with water. Avoid your genital area. Avoid any areas of skin that have broken skin, cuts, or scrapes. Scrub your back and under your arms. Make sure to wash skin folds. Let the lather sit on your skin for 1-2 minutes or as long as told by your health care provider. Using a different clean, wet washcloth, thoroughly rinse your entire body. Make sure that all body creases and crevices are rinsed well. Dry off with a clean towel. Do not put any substances on your body afterward--such as powder, lotion, or perfume--unless you are told to do so by your health care provider. Only use lotions that are recommended by the manufacturer. Put on clean clothes or pajamas. If it is the night before your surgery, sleep in clean sheets. How to use CHG prepackaged cloths Only use CHG cloths as told by your health care provider, and follow the instructions on the label. Use the CHG cloth on clean, dry skin. Do not use the CHG cloth on your head or face unless your health care provider tells you to. When washing with the CHG cloth: Avoid your genital area. Avoid any areas of skin that have broken skin, cuts, or scrapes. Before surgery Follow these steps when using a CHG cloth to clean before surgery (unless  your health care provider gives you different instructions): Using the CHG  cloth, vigorously scrub the part of your body where you will be having surgery. Scrub using a back-and-forth motion for 3 minutes. The area on your body should be completely wet with CHG when you are done scrubbing. Do not rinse. Discard the cloth and let the area air-dry. Do not put any substances on the area afterward, such as powder, lotion, or perfume. Put on clean clothes or pajamas. If it is the night before your surgery, sleep in clean sheets.  For general bathing Follow these steps when using CHG cloths for general bathing (unless your health care provider gives you different instructions). Use a separate CHG cloth for each area of your body. Make sure you wash between any folds of skin and between your fingers and toes. Wash your body in the following order, switching to a new cloth after each step: The front of your neck, shoulders, and chest. Both of your arms, under your arms, and your hands. Your stomach and groin area, avoiding the genitals. Your right leg and foot. Your left leg and foot. The back of your neck, your back, and your buttocks. Do not rinse. Discard the cloth and let the area air-dry. Do not put any substances on your body afterward--such as powder, lotion, or perfume--unless you are told to do so by your health care provider. Only use lotions that are recommended by the manufacturer. Put on clean clothes or pajamas. Contact a health care provider if: Your skin gets irritated after scrubbing. You have questions about using your solution or cloth. You swallow any chlorhexidine. Call your local poison control center (1-(873) 490-7127 in the [place] Get help right away if: Your eyes itch badly, or they become very red or swollen. Your skin itches badly and is red or swollen. Your hearing changes. You have trouble seeing. You have swelling or tingling in your mouth or throat. You have trouble breathing. These symptoms may represent a serious problem that is an  emergency. Do not wait to see if the symptoms will go away. Get medical help right away. Call your local emergency services (911 in the [place] Do not drive yourself to the hospital. Summary Chlorhexidine gluconate (CHG) is a germ-killing (antiseptic) solution that is used to clean the skin. Cleaning your skin with CHG may help to lower your risk for infection. You may be given CHG to use for bathing. It may be in a bottle or in a prepackaged cloth to use on your skin. Carefully follow your health care provider's instructions and the instructions on the product label. Do not use CHG if you have a chlorhexidine allergy. Contact your health care provider if your skin gets irritated after scrubbing. This information is not intended to replace advice given to you by your health care provider. Make sure you discuss any questions you have with your health care provider. Document Revised: 03/09/2020 Document Reviewed: 03/09/2020 Elsevier Patient Education  2022 [employer].

## 2020-10-06 ENCOUNTER — Ambulatory Visit (HOSPITAL_COMMUNITY)
Admission: RE | Admit: 2020-10-06 | Discharge: 2020-10-06 | Disposition: A | Discharge status: Home / Self Care | Payer: Medicare Other | Source: Ambulatory Visit | Attending: General Surgery | Admitting: General Surgery

## 2020-10-06 ENCOUNTER — Other Ambulatory Visit: Payer: Self-pay

## 2020-10-06 DIAGNOSIS — R928 Other abnormal and inconclusive findings on diagnostic imaging of breast: Secondary | ICD-10-CM | POA: Insufficient documentation

## 2020-10-06 MED ORDER — LIDOCAINE HCL (PF) 2 % IJ SOLN
INTRAMUSCULAR | Status: AC
  Filled 2020-10-06: qty 10

## 2020-10-07 ENCOUNTER — Encounter (HOSPITAL_COMMUNITY): Payer: Self-pay

## 2020-10-07 ENCOUNTER — Encounter (HOSPITAL_COMMUNITY)
Admission: RE | Admit: 2020-10-07 | Discharge: 2020-10-07 | Disposition: A | Discharge status: Home / Self Care | Payer: Medicare Other | Source: Ambulatory Visit | Attending: General Surgery | Admitting: General Surgery

## 2020-10-07 DIAGNOSIS — N649 Disorder of breast, unspecified: Secondary | ICD-10-CM | POA: Diagnosis not present

## 2020-10-07 DIAGNOSIS — Z01812 Encounter for preprocedural laboratory examination: Secondary | ICD-10-CM | POA: Insufficient documentation

## 2020-10-07 HISTORY — DX: Dyspnea, unspecified: R06.00

## 2020-10-07 HISTORY — DX: Prediabetes: R73.03

## 2020-10-07 HISTORY — DX: Bronchitis, not specified as acute or chronic: J40

## 2020-10-07 LAB — BASIC METABOLIC PANEL
Anion gap: 5 (ref 5–15)
BUN: 12 mg/dL (ref 8–23)
CO2: 28 mmol/L (ref 22–32)
Calcium: 9 mg/dL (ref 8.9–10.3)
Chloride: 107 mmol/L (ref 98–111)
Creatinine, Ser: 0.69 mg/dL (ref 0.44–1.00)
GFR, Estimated: >60 mL/min (ref >60)
Glucose, Bld: 97 mg/dL (ref 70–99)
Potassium: 3.7 mmol/L (ref 3.5–5.1)
Sodium: 140 mmol/L (ref 135–145)

## 2020-10-07 LAB — CBC WITH DIFFERENTIAL/PLATELET
Abs Immature Granulocytes: 0.03 10*3/uL (ref 0.00–0.07)
Basophils Absolute: 0 10*3/uL (ref 0.0–0.1)
Basophils Relative: 0 %
Eosinophils Absolute: 0.4 10*3/uL (ref 0.0–0.5)
Eosinophils Relative: 4 %
HCT: 36.4 % (ref 36.0–46.0)
Hemoglobin: 12.1 g/dL (ref 12.0–15.0)
Immature Granulocytes: 0 %
Lymphocytes Relative: 19 %
Lymphs Abs: 1.7 10*3/uL (ref 0.7–4.0)
MCH: 33.7 pg (ref 26.0–34.0)
MCHC: 33.2 g/dL (ref 30.0–36.0)
MCV: 101.4 fL — ABNORMAL HIGH (ref 80.0–100.0)
Monocytes Absolute: 0.7 10*3/uL (ref 0.1–1.0)
Monocytes Relative: 7 %
Neutro Abs: 6.3 10*3/uL (ref 1.7–7.7)
Neutrophils Relative %: 70 %
Platelets: 217 10*3/uL (ref 150–400)
RBC: 3.59 MIL/uL — ABNORMAL LOW (ref 3.87–5.11)
RDW: 14.5 % (ref 11.5–15.5)
WBC: 9 10*3/uL (ref 4.0–10.5)
nRBC: 0 % (ref 0.0–0.2)

## 2020-10-08 ENCOUNTER — Encounter (HOSPITAL_COMMUNITY): Payer: Self-pay | Admitting: Anesthesiology

## 2020-10-09 ENCOUNTER — Other Ambulatory Visit (HOSPITAL_COMMUNITY): Payer: Self-pay | Admitting: General Surgery

## 2020-10-09 ENCOUNTER — Ambulatory Visit (HOSPITAL_COMMUNITY)
Admission: RE | Admit: 2020-10-09 | Discharge: 2020-10-09 | Disposition: A | Discharge status: Home / Self Care | Payer: Medicare Other | Source: Ambulatory Visit | Attending: General Surgery | Admitting: General Surgery

## 2020-10-09 DIAGNOSIS — R928 Other abnormal and inconclusive findings on diagnostic imaging of breast: Secondary | ICD-10-CM

## 2020-10-09 NOTE — OR Nursing (Signed)
Procedure cancelled  by patient . Complaints of her being sick and running a fever.  Dr. Arnoldo Morale Notified.

## 2020-10-28 ENCOUNTER — Ambulatory Visit: Payer: Medicare Other | Admitting: Gastroenterology

## 2020-11-03 NOTE — Patient Instructions (Signed)
Cassandra Fowler  11/03/2020     @PREFPERIOPPHARMACY @   Your procedure is scheduled on 11/06/2020.   Report to Cassandra Fowler at  [phone] A.M.   Call this number if you have problems the morning of surgery:  [phone]   Remember:  Do not eat or drink after midnight.                         Take these medicines the morning of surgery with A SIP OF WATER          baclofen, protonix.    Do not wear jewelry, make-up or nail polish.  Do not wear lotions, powders, or perfumes, or deodorant.  Do not shave 48 hours prior to surgery.  Men may shave face and neck.  Do not bring valuables to the hospital.  [facility] is not responsible for any belongings or valuables.  Contacts, dentures or bridgework may not be worn into surgery.  Leave your suitcase in the car.  After surgery it may be brought to your room.  For patients admitted to the hospital, discharge time will be determined by your treatment team.  Patients discharged the day of surgery will not be allowed to drive home and must have someone with them for 24 hours.    Special instructions:   DO NOT smoke tobacco or vape for 24 hours before your procedure.  Please read over the following fact sheets that you were given. Anesthesia Post-op Instructions and Care and Recovery After Surgery      Breast Biopsy, Care After These instructions give you information about caring for yourself after your procedure. Your doctor may also give you more specific instructions. Call your doctor if you have any problems or questions after your procedure. What can I expect after the procedure? After your procedure, it is common to have: Bruising on your breast. Numbness, tingling, or pain near your biopsy site. Follow these instructions at home: Medicines Take over-the-counter and prescription medicines only as told by your doctor. Do not drive for 24 hours if you were given a medicine to help you relax  (sedative) during your procedure. Do not drink alcohol while taking pain medicine. Do not drive or use heavy machinery while taking prescription pain medicine. Biopsy site care   Follow instructions from your doctor about how to take care of your cut from surgery (incision) or your puncture area. Make sure you: Wash your hands with soap and water before you change your bandage (dressing). If you cannot use soap and water, use hand sanitizer. Change your bandage as told by your doctor. Leave stitches (sutures), skin glue, or skin tape (adhesive strips) in place. They may need to stay in place for 2 weeks or longer. If tape strips get loose and curl up, you may trim the loose edges. Do not remove tape strips completely unless your doctor says it is okay. If you have stitches, keep them dry when you take a bath or a shower. Check your cut or puncture area every day for signs of infection. Check for: Redness, swelling, or pain. Fluid or blood. Warmth. Pus or a bad smell. Protect the biopsy area. Do not let the area get bumped. Activity If you had a cut during your procedure, avoid activities that could pull your cut open. These include: Stretching. Reaching over your head. Exercise. Sports. Lifting anything that weighs more than 3 lb (1.4 kg). Return to  your normal activities as told by your doctor. Ask your doctor what activities are safe for you. Managing pain, stiffness, and swelling If told, put ice on the biopsy site to relieve swelling: Put ice in a plastic bag. Place a towel between your skin and the bag. Leave the ice on for 20 minutes, 2-3 times a day. General instructions Continue your normal diet. Wear a good support bra for as long as told by your doctor. Get checked for extra fluid around your lymph nodes (lymphedema) as often as told by your doctor. Keep all follow-up visits as told by your doctor. This is important. Contact a doctor if: You notice any of the following at  the biopsy site: More redness, swelling, or pain. More fluid or blood coming from the site. The site feels warm to the touch. Pus or a bad smell coming from the site. The site breaks open after the stitches or skin tape strips have been removed. You have a rash. You have a fever. Get help right away if: You have more bleeding from the biopsy site. Get help right away if bleeding is more than a small spot. You have trouble breathing. You have red streaks around the biopsy site. Summary After your procedure, it is common to have bruising, numbness, tingling, or pain near the biopsy site. Do not drive or use heavy machinery while taking prescription pain medicine. Wear a good support bra for as long as told by your doctor. If you had a cut during your procedure, avoid activities that may pull the cut open. Ask your doctor what activities are safe for you. This information is not intended to replace advice given to you by your health care provider. Make sure you discuss any questions you have with your health care provider. Document Revised: 09/09/2019 Document Reviewed: 06/15/2017 Elsevier Patient Education  2022 Cassandra Fowler Anesthesia, Adult, Care After This sheet gives you information about how to care for yourself after your procedure. Your health care provider may also give you more specific instructions. If you have problems or questions, contact your health care provider. What can I expect after the procedure? After the procedure, the following side effects are common: Pain or discomfort at the IV site. Nausea. Vomiting. Sore throat. Trouble concentrating. Feeling cold or chills. Feeling weak or tired. Sleepiness and fatigue. Soreness and body aches. These side effects can affect parts of the body that were not involved in surgery. Follow these instructions at home: For the time period you were told by your health care provider:  Rest. Do not participate in  activities where you could fall or become injured. Do not drive or use machinery. Do not drink alcohol. Do not take sleeping pills or medicines that cause drowsiness. Do not make important decisions or sign legal documents. Do not take care of children on your own. Eating and drinking Follow any instructions from your health care provider about eating or drinking restrictions. When you feel hungry, start by eating small amounts of foods that are soft and easy to digest (bland), such as toast. Gradually return to your regular diet. Drink enough fluid to keep your urine pale yellow. If you vomit, rehydrate by drinking water, juice, or clear broth. General instructions If you have sleep apnea, surgery and certain medicines can increase your risk for breathing problems. Follow instructions from your health care provider about wearing your sleep device: Anytime you are sleeping, including during daytime naps. While taking prescription pain medicines, sleeping medicines, or  medicines that make you drowsy. Have a responsible adult stay with you for the time you are told. It is important to have someone help care for you until you are awake and alert. Return to your normal activities as told by your health care provider. Ask your health care provider what activities are safe for you. Take over-the-counter and prescription medicines only as told by your health care provider. If you smoke, do not smoke without supervision. Keep all follow-up visits as told by your health care provider. This is important. Contact a health care provider if: You have nausea or vomiting that does not get better with medicine. You cannot eat or drink without vomiting. You have pain that does not get better with medicine. You are unable to pass urine. You develop a skin rash. You have a fever. You have redness around your IV site that gets worse. Get help right away if: You have difficulty breathing. You have chest  pain. You have blood in your urine or stool, or you vomit blood. Summary After the procedure, it is common to have a sore throat or nausea. It is also common to feel tired. Have a responsible adult stay with you for the time you are told. It is important to have someone help care for you until you are awake and alert. When you feel hungry, start by eating small amounts of foods that are soft and easy to digest (bland), such as toast. Gradually return to your regular diet. Drink enough fluid to keep your urine pale yellow. Return to your normal activities as told by your health care provider. Ask your health care provider what activities are safe for you. This information is not intended to replace advice given to you by your health care provider. Make sure you discuss any questions you have with your health care provider. Document Revised: 09/12/2019 Document Reviewed: 04/11/2019 Elsevier Patient Education  2022 [place]. How to Use Chlorhexidine for Bathing Chlorhexidine gluconate (CHG) is a germ-killing (antiseptic) solution that is used to clean the skin. It can get rid of the bacteria that normally live on the skin and can keep them away for about 24 hours. To clean your skin with CHG, you may be given: A CHG solution to use in the shower or as part of a sponge bath. A prepackaged cloth that contains CHG. Cleaning your skin with CHG may help lower the risk for infection: While you are staying in the intensive care unit of the hospital. If you have a vascular access, such as a central line, to provide short-term or long-term access to your veins. If you have a catheter to drain urine from your bladder. If you are on a ventilator. A ventilator is a machine that helps you breathe by moving air in and out of your lungs. After surgery. What are the risks? Risks of using CHG include: A skin reaction. Hearing loss, if CHG gets in your ears and you have a perforated eardrum. Eye injury, if  CHG gets in your eyes and is not rinsed out. The CHG product catching fire. Make sure that you avoid smoking and flames after applying CHG to your skin. Do not use CHG: If you have a chlorhexidine allergy or have previously reacted to chlorhexidine. On babies younger than 89 months of age. How to use CHG solution Use CHG only as told by your health care provider, and follow the instructions on the label. Use the full amount of CHG as directed. Usually, this  is one bottle. During a shower Follow these steps when using CHG solution during a shower (unless your health care provider gives you different instructions): Start the shower. Use your normal soap and shampoo to wash your face and hair. Turn off the shower or move out of the shower stream. Pour the CHG onto a clean washcloth. Do not use any type of brush or rough-edged sponge. Starting at your neck, lather your body down to your toes. Make sure you follow these instructions: If you will be having surgery, pay special attention to the part of your body where you will be having surgery. Scrub this area for at least 1 minute. Do not use CHG on your head or face. If the solution gets into your ears or eyes, rinse them well with water. Avoid your genital area. Avoid any areas of skin that have broken skin, cuts, or scrapes. Scrub your back and under your arms. Make sure to wash skin folds. Let the lather sit on your skin for 1-2 minutes or as long as told by your health care provider. Thoroughly rinse your entire body in the shower. Make sure that all body creases and crevices are rinsed well. Dry off with a clean towel. Do not put any substances on your body afterward--such as powder, lotion, or perfume--unless you are told to do so by your health care provider. Only use lotions that are recommended by the manufacturer. Put on clean clothes or pajamas. If it is the night before your surgery, sleep in clean sheets.  During a sponge  bath Follow these steps when using CHG solution during a sponge bath (unless your health care provider gives you different instructions): Use your normal soap and shampoo to wash your face and hair. Pour the CHG onto a clean washcloth. Starting at your neck, lather your body down to your toes. Make sure you follow these instructions: If you will be having surgery, pay special attention to the part of your body where you will be having surgery. Scrub this area for at least 1 minute. Do not use CHG on your head or face. If the solution gets into your ears or eyes, rinse them well with water. Avoid your genital area. Avoid any areas of skin that have broken skin, cuts, or scrapes. Scrub your back and under your arms. Make sure to wash skin folds. Let the lather sit on your skin for 1-2 minutes or as long as told by your health care provider. Using a different clean, wet washcloth, thoroughly rinse your entire body. Make sure that all body creases and crevices are rinsed well. Dry off with a clean towel. Do not put any substances on your body afterward--such as powder, lotion, or perfume--unless you are told to do so by your health care provider. Only use lotions that are recommended by the manufacturer. Put on clean clothes or pajamas. If it is the night before your surgery, sleep in clean sheets. How to use CHG prepackaged cloths Only use CHG cloths as told by your health care provider, and follow the instructions on the label. Use the CHG cloth on clean, dry skin. Do not use the CHG cloth on your head or face unless your health care provider tells you to. When washing with the CHG cloth: Avoid your genital area. Avoid any areas of skin that have broken skin, cuts, or scrapes. Before surgery Follow these steps when using a CHG cloth to clean before surgery (unless your health care provider gives you  different instructions): Using the CHG cloth, vigorously scrub the part of your body where you  will be having surgery. Scrub using a back-and-forth motion for 3 minutes. The area on your body should be completely wet with CHG when you are done scrubbing. Do not rinse. Discard the cloth and let the area air-dry. Do not put any substances on the area afterward, such as powder, lotion, or perfume. Put on clean clothes or pajamas. If it is the night before your surgery, sleep in clean sheets.  For general bathing Follow these steps when using CHG cloths for general bathing (unless your health care provider gives you different instructions). Use a separate CHG cloth for each area of your body. Make sure you wash between any folds of skin and between your fingers and toes. Wash your body in the following order, switching to a new cloth after each step: The front of your neck, shoulders, and chest. Both of your arms, under your arms, and your hands. Your stomach and groin area, avoiding the genitals. Your right leg and foot. Your left leg and foot. The back of your neck, your back, and your buttocks. Do not rinse. Discard the cloth and let the area air-dry. Do not put any substances on your body afterward--such as powder, lotion, or perfume--unless you are told to do so by your health care provider. Only use lotions that are recommended by the manufacturer. Put on clean clothes or pajamas. Contact a health care provider if: Your skin gets irritated after scrubbing. You have questions about using your solution or cloth. You swallow any chlorhexidine. Call your local poison control center (1-215-365-4274 in the [place] Get help right away if: Your eyes itch badly, or they become very red or swollen. Your skin itches badly and is red or swollen. Your hearing changes. You have trouble seeing. You have swelling or tingling in your mouth or throat. You have trouble breathing. These symptoms may represent a serious problem that is an emergency. Do not wait to see if the symptoms will go away. Get  medical help right away. Call your local emergency services (911 in the [place] Do not drive yourself to the hospital. Summary Chlorhexidine gluconate (CHG) is a germ-killing (antiseptic) solution that is used to clean the skin. Cleaning your skin with CHG may help to lower your risk for infection. You may be given CHG to use for bathing. It may be in a bottle or in a prepackaged cloth to use on your skin. Carefully follow your health care provider's instructions and the instructions on the product label. Do not use CHG if you have a chlorhexidine allergy. Contact your health care provider if your skin gets irritated after scrubbing. This information is not intended to replace advice given to you by your health care provider. Make sure you discuss any questions you have with your health care provider. Document Revised: 03/09/2020 Document Reviewed: 03/09/2020 Elsevier Patient Education  2022 [employer].

## 2020-11-04 ENCOUNTER — Other Ambulatory Visit: Payer: Self-pay

## 2020-11-04 ENCOUNTER — Encounter (HOSPITAL_COMMUNITY): Payer: Self-pay

## 2020-11-04 ENCOUNTER — Encounter (HOSPITAL_COMMUNITY)
Admission: RE | Admit: 2020-11-04 | Discharge: 2020-11-04 | Disposition: A | Discharge status: Home / Self Care | Payer: Medicare Other | Source: Ambulatory Visit | Attending: General Surgery | Admitting: General Surgery

## 2020-11-04 VITALS — Ht <= 58 in | Wt 163.0 lb

## 2020-11-04 DIAGNOSIS — R7303 Prediabetes: Secondary | ICD-10-CM

## 2020-11-06 ENCOUNTER — Other Ambulatory Visit (HOSPITAL_COMMUNITY): Payer: Self-pay | Admitting: Sports Medicine

## 2020-11-06 ENCOUNTER — Ambulatory Visit (HOSPITAL_COMMUNITY): Payer: Medicare Other | Admitting: Anesthesiology

## 2020-11-06 ENCOUNTER — Ambulatory Visit (HOSPITAL_COMMUNITY)
Admission: RE | Admit: 2020-11-06 | Discharge: 2020-11-06 | Disposition: A | Discharge status: Home / Self Care | Payer: Medicare Other | Attending: General Surgery | Admitting: General Surgery

## 2020-11-06 ENCOUNTER — Encounter (HOSPITAL_COMMUNITY): Payer: Self-pay | Admitting: General Surgery

## 2020-11-06 ENCOUNTER — Encounter (HOSPITAL_COMMUNITY)
Admission: RE | Disposition: A | Discharge status: Home / Self Care | Payer: Self-pay | Source: Home / Self Care | Attending: General Surgery

## 2020-11-06 ENCOUNTER — Ambulatory Visit (HOSPITAL_COMMUNITY)
Admission: RE | Admit: 2020-11-06 | Discharge: 2020-11-06 | Disposition: A | Discharge status: Home / Self Care | Payer: Medicare Other | Source: Home / Self Care | Attending: Sports Medicine | Admitting: Sports Medicine

## 2020-11-06 DIAGNOSIS — N6021 Fibroadenosis of right breast: Secondary | ICD-10-CM | POA: Diagnosis not present

## 2020-11-06 DIAGNOSIS — N6091 Unspecified benign mammary dysplasia of right breast: Secondary | ICD-10-CM | POA: Diagnosis present

## 2020-11-06 DIAGNOSIS — R928 Other abnormal and inconclusive findings on diagnostic imaging of breast: Secondary | ICD-10-CM

## 2020-11-06 DIAGNOSIS — F17210 Nicotine dependence, cigarettes, uncomplicated: Secondary | ICD-10-CM | POA: Insufficient documentation

## 2020-11-06 DIAGNOSIS — D0511 Intraductal carcinoma in situ of right breast: Secondary | ICD-10-CM | POA: Insufficient documentation

## 2020-11-06 DIAGNOSIS — Z880 Allergy status to penicillin: Secondary | ICD-10-CM | POA: Insufficient documentation

## 2020-11-06 DIAGNOSIS — Z79899 Other long term (current) drug therapy: Secondary | ICD-10-CM | POA: Insufficient documentation

## 2020-11-06 DIAGNOSIS — Z888 Allergy status to other drugs, medicaments and biological substances status: Secondary | ICD-10-CM | POA: Diagnosis not present

## 2020-11-06 DIAGNOSIS — R7303 Prediabetes: Secondary | ICD-10-CM

## 2020-11-06 HISTORY — PX: BREAST BIOPSY: SHX20

## 2020-11-06 HISTORY — PX: BREAST LUMPECTOMY WITH RADIOFREQUENCY TAG IDENTIFICATION: SHX6884

## 2020-11-06 LAB — HEMOGLOBIN A1C
Hgb A1c MFr Bld: 5.2 % (ref 4.8–5.6)
Mean Plasma Glucose: 102.54 mg/dL

## 2020-11-06 SURGERY — BREAST BIOPSY
Anesthesia: General | Site: Breast | Laterality: Right

## 2020-11-06 MED ORDER — CHLORHEXIDINE GLUCONATE CLOTH 2 % EX PADS: 6.0000 | MEDICATED_PAD | Freq: Once | CUTANEOUS | Status: DC

## 2020-11-06 MED ORDER — DEXAMETHASONE SODIUM PHOSPHATE 10 MG/ML IJ SOLN
INTRAMUSCULAR | Status: AC
  Filled 2020-11-06: qty 1

## 2020-11-06 MED ORDER — BUPIVACAINE HCL (PF) 0.5 % IJ SOLN
INTRAMUSCULAR | Status: AC
  Filled 2020-11-06: qty 30

## 2020-11-06 MED ORDER — LACTATED RINGERS IV SOLN: INTRAVENOUS | Status: DC

## 2020-11-06 MED ORDER — PROPOFOL 10 MG/ML IV BOLUS
INTRAVENOUS | Status: DC | PRN
  Administered 2020-11-06: 150 mg via INTRAVENOUS

## 2020-11-06 MED ORDER — LIDOCAINE HCL (CARDIAC) PF 100 MG/5ML IV SOSY
PREFILLED_SYRINGE | INTRAVENOUS | Status: DC | PRN
  Administered 2020-11-06: 80 mg via INTRATRACHEAL

## 2020-11-06 MED ORDER — ONDANSETRON HCL 4 MG/2ML IJ SOLN
INTRAMUSCULAR | Status: AC
  Filled 2020-11-06: qty 2

## 2020-11-06 MED ORDER — CHLORHEXIDINE GLUCONATE 0.12 % MT SOLN
15.0000 mL | Freq: Once | OROMUCOSAL | Status: AC
  Administered 2020-11-06: 15 mL via OROMUCOSAL

## 2020-11-06 MED ORDER — ONDANSETRON HCL 4 MG/2ML IJ SOLN: 4.0000 mg | Freq: Once | INTRAMUSCULAR | Status: DC | PRN

## 2020-11-06 MED ORDER — TRAMADOL HCL 50 MG PO TABS: 50.0000 mg | ORAL_TABLET | Freq: Four times a day (QID) | ORAL | 0 refills | Status: DC | PRN

## 2020-11-06 MED ORDER — ORAL CARE MOUTH RINSE: 15.0000 mL | Freq: Once | OROMUCOSAL | Status: AC

## 2020-11-06 MED ORDER — PHENYLEPHRINE 40 MCG/ML (10ML) SYRINGE FOR IV PUSH (FOR BLOOD PRESSURE SUPPORT)
PREFILLED_SYRINGE | INTRAVENOUS | Status: AC
  Filled 2020-11-06: qty 10

## 2020-11-06 MED ORDER — FENTANYL CITRATE (PF) 100 MCG/2ML IJ SOLN
INTRAMUSCULAR | Status: AC
  Filled 2020-11-06: qty 2

## 2020-11-06 MED ORDER — FENTANYL CITRATE (PF) 100 MCG/2ML IJ SOLN
INTRAMUSCULAR | Status: DC | PRN
  Administered 2020-11-06: 50 ug via INTRAVENOUS

## 2020-11-06 MED ORDER — FENTANYL CITRATE PF 50 MCG/ML IJ SOSY: 25.0000 ug | PREFILLED_SYRINGE | INTRAMUSCULAR | Status: DC | PRN

## 2020-11-06 MED ORDER — BUPIVACAINE HCL (PF) 0.5 % IJ SOLN
INTRAMUSCULAR | Status: DC | PRN
  Administered 2020-11-06: 10 mL

## 2020-11-06 MED ORDER — EPHEDRINE SULFATE 50 MG/ML IJ SOLN
INTRAMUSCULAR | Status: DC | PRN
  Administered 2020-11-06 (×2): 5 mg via INTRAVENOUS

## 2020-11-06 MED ORDER — SODIUM CHLORIDE 0.9 % IR SOLN
Status: DC | PRN
  Administered 2020-11-06: 1000 mL

## 2020-11-06 MED ORDER — PROPOFOL 10 MG/ML IV BOLUS
INTRAVENOUS | Status: AC
  Filled 2020-11-06: qty 20

## 2020-11-06 SURGICAL SUPPLY — 30 items
ADH SKN CLS APL DERMABOND .7 (GAUZE/BANDAGES/DRESSINGS) ×1
APL PRP STRL LF DISP 70% ISPRP (MISCELLANEOUS) ×1
BLADE SURG 15 STRL LF DISP TIS (BLADE) ×1 IMPLANT
BLADE SURG 15 STRL SS (BLADE) ×2
CHLORAPREP W/TINT 26 (MISCELLANEOUS) ×2 IMPLANT
CLOTH BEACON ORANGE TIMEOUT ST (SAFETY) ×2 IMPLANT
COVER LIGHT HANDLE STERIS (MISCELLANEOUS) ×4 IMPLANT
DERMABOND ADVANCED (GAUZE/BANDAGES/DRESSINGS) ×1
DERMABOND ADVANCED .7 DNX12 (GAUZE/BANDAGES/DRESSINGS) ×1 IMPLANT
DEVICE DUBIN SPECIMEN MAMMOGRA (MISCELLANEOUS) ×2 IMPLANT
ELECT REM PT RETURN 9FT ADLT (ELECTROSURGICAL) ×2
ELECTRODE REM PT RTRN 9FT ADLT (ELECTROSURGICAL) ×1 IMPLANT
GLOVE SURG POLYISO LF SZ7.5 (GLOVE) ×2 IMPLANT
GLOVE SURG UNDER POLY LF SZ7 (GLOVE) ×4 IMPLANT
GOWN STRL REUS W/TWL LRG LVL3 (GOWN DISPOSABLE) ×4 IMPLANT
KIT TURNOVER KIT A (KITS) ×2 IMPLANT
MANIFOLD NEPTUNE II (INSTRUMENTS) ×2 IMPLANT
NEEDLE HYPO 18GX1.5 BLUNT FILL (NEEDLE) ×2 IMPLANT
NEEDLE HYPO 25X1 1.5 SAFETY (NEEDLE) ×2 IMPLANT
NS IRRIG 1000ML POUR BTL (IV SOLUTION) ×2 IMPLANT
PACK MINOR (CUSTOM PROCEDURE TRAY) ×2 IMPLANT
PAD ARMBOARD 7.5X6 YLW CONV (MISCELLANEOUS) ×2 IMPLANT
SET BASIN LINEN APH (SET/KITS/TRAYS/PACK) ×2 IMPLANT
SET LOCALIZER 20 PROBE US (MISCELLANEOUS) ×2 IMPLANT
SUT MNCRL AB 4-0 PS2 18 (SUTURE) ×2 IMPLANT
SUT SILK 2 0 SH (SUTURE) ×2 IMPLANT
SUT VIC AB 3-0 SH 27 (SUTURE) ×2
SUT VIC AB 3-0 SH 27X BRD (SUTURE) ×1 IMPLANT
SYR 30ML LL (SYRINGE) ×2 IMPLANT
SYR CONTROL 10ML LL (SYRINGE) ×2 IMPLANT

## 2020-11-06 NOTE — Interval H&P Note (Signed)
History and Physical Interval Note:  11/06/2020 7:18 AM  Cassandra Fowler  has presented today for surgery, with the diagnosis of atypical ductal hyperplasia.  The various methods of treatment have been discussed with the patient and family. After consideration of risks, benefits and other options for treatment, the patient has consented to  Procedure(s): BREAST BIOPSY (Right) BREAST LUMPECTOMY WITH RADIOFREQUENCY TAG IDENTIFICATION (Right) as a surgical intervention.  The patient's history has been reviewed, patient examined, no change in status, stable for surgery.  I have reviewed the patient's chart and labs.  Questions were answered to the patient's satisfaction.     Aviva Signs

## 2020-11-06 NOTE — Anesthesia Postprocedure Evaluation (Signed)
Anesthesia Post Note  [name]  Procedure(s) Performed: BREAST BIOPSY (Right: Breast) BREAST LUMPECTOMY WITH RADIOFREQUENCY TAG IDENTIFICATION (Right: Breast)  Patient location during evaluation: PACU Anesthesia Type: General Level of consciousness: awake and alert and oriented Pain management: pain level controlled Vital Signs Assessment: post-procedure vital signs reviewed and stable Respiratory status: spontaneous breathing, nonlabored ventilation and respiratory function stable Cardiovascular status: blood pressure returned to baseline and stable Postop Assessment: no apparent nausea or vomiting Anesthetic complications: no   No notable events documented.   Last Vitals:  Vitals:   11/06/20 0845 11/06/20 0854  BP: 115/76 129/74  Pulse: 60 (!) 59  Resp: 17 16  Temp:  36.6 C  SpO2: 99% 93%    Last Pain:  Vitals:   11/06/20 0854  TempSrc: Oral  PainSc: 2                  Cassandra Fowler C Cassandra Fowler

## 2020-11-06 NOTE — Op Note (Signed)
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  She now presents for right breast biopsy.  She is already undergone radiofrequency tag localization.  The risks and benefits of the procedure including bleeding, infection, and the possibility of malignancy were fully explained to the patient, who gave informed consent.  Procedure note: The patient was placed in the supine position.  After general anesthesia was administered, the right breast was prepped and draped using the usual sterile technique with ChloraPrep.  Surgical site confirmation was performed.  Using the Hologic localizer, the tag was localized along the inferior aspect of the right breast.  An incision was made and the dissection was taken down to the area in question.  The radiofrequency tag was found.  The biopsy was then performed.  A short suture was placed superiorly and a long suture placed laterally for orientation purposes.  Specimen radiography revealed the previously placed clip in the specimen.  The specimen was then sent to pathology for further examination.  A bleeding was controlled using Bovie electrocautery.  0.5% Marcaine was placed in the surrounding tissue.  The subcutaneous layer was reapproximated using a 3-0 Vicryl interrupted suture.  The skin was closed using a 4-0 Monocryl subcuticular suture.  Dermabond was applied.  All tape and needle counts were correct at the end of the procedure.  The patient was awakened and transferred to PACU in stable condition.  Complications: None  EBL: Minimal  Specimen: Right breast tissue

## 2020-11-06 NOTE — Transfer of Care (Signed)
Immediate Anesthesia Transfer of Care Note  Patient: Cassandra Fowler  Procedure(s) Performed: BREAST BIOPSY (Right: Breast) BREAST LUMPECTOMY WITH RADIOFREQUENCY TAG IDENTIFICATION (Right: Breast)  Patient Location: PACU  Anesthesia Type:General  Level of Consciousness: awake, alert , oriented and patient cooperative  Airway & Oxygen Therapy: Patient Spontanous Breathing and Patient connected to face mask oxygen  Post-op Assessment: Report given to RN and Post -op Vital signs reviewed and stable  Post vital signs: Reviewed and stable  Last Vitals:  Vitals Value Taken Time  BP 126/77 11/06/20 0817  Temp    Pulse 53 11/06/20 0818  Resp 13 11/06/20 0818  SpO2 100 % 11/06/20 0818  Vitals shown include unvalidated device data.  Last Pain:  Vitals:   11/06/20 0801  TempSrc: Oral  PainSc:          Complications: No notable events documented.

## 2020-11-06 NOTE — Anesthesia Preprocedure Evaluation (Addendum)
Anesthesia Evaluation  Patient identified by MRN, date of birth, ID band Patient awake    Reviewed: Allergy & Precautions, NPO status , Patient's Chart, lab work & pertinent test results  Airway Mallampati: II  TM Distance: >3 FB Neck ROM: Full    Dental  (+) Edentulous Upper, Edentulous Lower   Pulmonary shortness of breath and with exertion, Current Smoker and Patient abstained from smoking.,    Pulmonary exam normal breath sounds clear to auscultation       Cardiovascular + DOE  Normal cardiovascular exam Rhythm:Regular Rate:Normal     Neuro/Psych negative neurological ROS  negative psych ROS   GI/Hepatic Neg liver ROS, GERD (gastric tumor resection)  Medicated,  Endo/Other  negative endocrine ROS  Renal/GU negative Renal ROS  negative genitourinary   Musculoskeletal  (+) Arthritis , Osteoarthritis,    Abdominal   Peds negative pediatric ROS (+)  Hematology negative hematology ROS (+)   Anesthesia Other Findings   Reproductive/Obstetrics negative OB ROS                            Anesthesia Physical Anesthesia Plan  ASA: 2  Anesthesia Plan: General   Post-op Pain Management:    Induction: Intravenous  PONV Risk Score and Plan: 3 and Ondansetron  Airway Management Planned: LMA  Additional Equipment:   Intra-op Plan:   Post-operative Plan: Extubation in OR  Informed Consent: I have reviewed the patients History and Physical, chart, labs and discussed the procedure including the risks, benefits and alternatives for the proposed anesthesia with the patient or authorized representative who has indicated his/her understanding and acceptance.     Dental advisory given  Plan Discussed with: CRNA and Surgeon  Anesthesia Plan Comments:        Anesthesia Quick Evaluation

## 2020-11-09 ENCOUNTER — Encounter (HOSPITAL_COMMUNITY): Payer: Self-pay | Admitting: General Surgery

## 2020-11-10 ENCOUNTER — Telehealth (INDEPENDENT_AMBULATORY_CARE_PROVIDER_SITE_OTHER): Payer: Medicare Other | Admitting: General Surgery

## 2020-11-10 DIAGNOSIS — Z09 Encounter for follow-up examination after completed treatment for conditions other than malignant neoplasm: Secondary | ICD-10-CM

## 2020-11-10 NOTE — Telephone Encounter (Signed)
I called the patient with the pathology results.  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  I recommended that she follow-up with him to discuss further treatment options.  Usually, the surgical options are either simple mastectomy or a partial mastectomy with radiation therapy.  Given her age and the findings, I would like to get Dr. Tomie China input as to whether she needs any further surgical therapy.  She understands this.  She will contact Dr. Delton Coombes to get a follow-up appointment sooner than previously scheduled.  She was also told that any daughters should get more frequent mammograms as that they are at higher risk for breast cancer. Results Surgical pathology (Order [id number]) MyChart Results Release  MyChart Status: Active  Results Release   In Basket Actions   Reviewed   Result Note   View in In Basket     Surgical pathology Order: [id number] Status: Edited Result - FINAL   Visible to patient: Yes (not seen)   Next appt: 12/02/2020 at 08:50 AM in Oncology ([facility])   0 Result Notes Component 4 d ago   SURGICAL PATHOLOGY SURGICAL PATHOLOGY  CASE: [id number]  PATIENT: Cassandra Fowler  Surgical Pathology Report      Clinical History: atypical ductal hyperplasia      FINAL MICROSCOPIC DIAGNOSIS:   A. BREAST, RIGHT, EXCISION:  - Ductal carcinoma in situ with calcifications.  - Margins negative for DCIS.  - See oncology table and comment.  - Biopsy site and biopsy clip.   ONCOLOGY TABLE:  DCIS OF THE BREAST:  Resection  Procedure: Right breast lumpectomy.  Specimen Laterality: Right breast.  Histologic Type: Ductal carcinoma in situ.  Size of DCIS: 1.5 cm, glass slide measurement.  Nuclear Grade: Intermediate.  Necrosis: No.  Margins: All surgical margins negative for DCIS.       Specify Closest Margin (required only if <51m): 15 mm, inferior.   Regional Lymph Nodes: No lymph nodes submitted.       Breast Biomarker Testing Performed on Previous Biopsy:       Testing performed on Case Number: Pending.       Estrogen Receptor: Pending.       Progesterone Receptor: Pending.  Pathologic Stage Classification (pTNM, AJCC 8th Edition): pTis, pN not  assigned  Representative Tumor Block: A1 and A2  Comment(s):  Immunohistochemistry for cytokeratin 5/6 supports the  diagnosis.  (v4.4.0.0)   GROSS DESCRIPTION:  Specimen type: Right breast excision, in formalin at 0807 hrs.  Size: 5 cm from anterior to posterior, 2.5 cm from superior to inferior,  2.4 cm from medial to lateral.  Orientation: Green Anterior, Blue Inferior, Orange Lateral, Yellow  Medial, Black Posterior, Red Superior.  Localized area: None.  An RF device is separate within the container.  Cut surface: The specimen consists predominantly of soft fatty tissue,  with gray-white soft to vaguely nodular fibrous tissue scattered  throughout the specimen.  Embedded within 1 area of nodular tissue at  the inferior margin is a cylindrical clip.  A discrete mass lesion is  not identified.  Margins: The clip is embedded within the inferior margin.  Prognostic indicators: Not applicable  Block summary:  Blocks 1, 2 = tissue containing clip, and nearest inferior and posterior  margins  Block 3 = additional inferior margin near clip  Block 4 = additional posterior margin near clip  Block 5 = superior margin nearest  tissue with clip  Block 6 = anterior margin nearest tissue with clip  Block 7 = medial margin nearest tissue with clip  Block 8 = lateral margin nearest tissue with clip  SW 11/06/2020   Final Diagnosis performed by Claudette Laws, MD.   Electronically signed  11/09/2020  Technical component performed at [facility], [place]  [address], [place], [place] [address].   Professional component performed at [facility],  [place]  [address] [place], [place] [address].   Immunohistochemistry Technical component (if applicable) was performed  at [facility]. [address], [place],  [place],  [address].   IMMUNOHISTOCHEMISTRY DISCLAIMER (if applicable):  Some of these immunohistochemical stains may have been developed and the  performance characteristics determine by [facility]. Some  may not have been cleared or approved by the U.S. Food and Drug  Administration. The FDA has determined that such clearance or approval  is not necessary. This test is used for clinical purposes. It should not  be regarded as investigational or for research. This laboratory is  certified under the [place]  (CLIA-88) as qualified to perform high complexity clinical laboratory  testing.  The controls stained appropriately.   Resulting Agency [facility]         Specimen Collected: 11/06/20 07:18 Last Resulted: 11/09/20 16:20      Lab Flowsheet    Order Details    View Encounter    Lab and Collection Details    Routing    Result History    View Encounter Conversation        Linked Documents  View Image    Result Care Coordination   Patient Communication   Add Comments   Add Notifications  Back to Top       Result Information  Status Priority Source  Edited Result - FINAL (11/09/2020 1620) Timed PATH Breast biopsy   Authorizing Provider Information  Name: Aviva Signs, MD Fax: [phone]  Phone: [phone] Pager:    Status of Active Orders View All Orders From This Encounter Expected   Increase activity slowly [BPP94327 Custom] 11/06/20  Diet - low sodium heart healthy [DIET9 Custom] 11/06/20    Surgical pathology: Patient Communication   Add Comments   Add Notifications     Cervical Cancer Screening - Results and Follow-ups Selected result Result date Tests and Procedures Follow-ups  11/06/2020   Surgical  pathology SURGICAL PATHOLOGY: SURGICAL PATHOLOGY  CASE: [id number]  PATIENT: [facility]  Surgical Pathology Report      Clinical History: atypical ductal hyperplasia      FINAL MICROSCOPIC DIAGNOSIS:   A. BREAST, RIGHT, EXCISION:  - Ductal carcinoma in situ with ca...   Cervical Cancer Screening History Report  View SmartLink Info  Surgical pathology (Order [id number]) on 11/06/20

## 2020-11-11 NOTE — Progress Notes (Signed)
[facility] [address] [place] [address]   Patient Care Team: Leonie Douglas, MD as PCP - General (Family Medicine) Gala Romney, Cristopher Estimable, MD as Consulting Physician (Gastroenterology) Derek Jack, MD as Medical Oncologist (Medical Oncology)  CHIEF COMPLAINTS/PURPOSE OF CONSULTATION:  Newly diagnosed right breast DCIS  HISTORY OF PRESENTING ILLNESS:  Cassandra Fowler 75 y.o. female is here because of recent diagnosis of right breast DCIS  Today she reports feeling good. She is accompanied by her daughter, and her granddaughter is present via telephone. She has never previously had a bone density scan. She takes calcium and vitamin D daily. She denies history of bone fractures. Her maternal cousin, her maternal aunt, her maternal great aunt, and her maternal great great aunt had breast cancer. Her maternal cousin had ovarian and cervical cancer, and her daughter had ovarian, cervical, and vaginal cancer at age 61.   In terms of breast cancer risk profile:  She menarched at early age of 82 and she had a hysterectomy in 1991 due to heavy menses.  She had 2 pregnancies 1 of which was a miscarriage, her first child was born at age 75  She received birth control pills for approximately 1 year.  She was never exposed to fertility medications or hormone replacement therapy.  She has family history of Breast/GYN/GI cancer  I reviewed her records extensively and collaborated the history with the patient.  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 The report date is  05/09/2020  The Multi-Cancer + RNA Panel offered by [facility] includes sequencing and/or deletion/duplication analysis of the following 84 genes:  AIP*, ALK, APC*, ATM*, AXIN2*, BAP1*, BARD1*, BLM*, BMPR1A*, BRCA1*, BRCA2*, BRIP1*, CASR, CDC73*, CDH1*, CDK4, CDKN1B*, CDKN1C*, CDKN2A, CEBPA, CHEK2*, CTNNA1*, DICER1*, DIS3L2*, EGFR, EPCAM, FH*, FLCN*, GATA2*, GPC3, GREM1, HOXB13, HRAS, KIT, MAX*, MEN1*, MET, MITF, MLH1*, MSH2*, MSH3*, MSH6*, MUTYH*, NBN*, NF1*, NF2*, NTHL1*, PALB2*, PDGFRA, PHOX2B, PMS2*, POLD1*, POLE*, POT1*, PRKAR1A*, PTCH1*, PTEN*, RAD50*, RAD51C*, RAD51D*, RB1*, RECQL4, RET, RUNX1*, SDHA*, SDHAF2*, SDHB*, SDHC*, SDHD*, SMAD4*, SMARCA4*, SMARCB1*, SMARCE1*, STK11*, SUFU*, TERC, TERT, TMEM127*, Tp53*, TSC1*, TSC2*, VHL*, WRN*, and WT1.  RNA analysis is performed for * genes.       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   BREAST BIOPSY Right 11/06/2020   Procedure: BREAST BIOPSY;  Surgeon: Aviva Signs, MD;  Location: [facility];  Service: General;  Laterality: Right;   BREAST LUMPECTOMY WITH RADIOFREQUENCY TAG IDENTIFICATION Right 11/06/2020   Procedure: BREAST LUMPECTOMY WITH RADIOFREQUENCY TAG IDENTIFICATION;  Surgeon:  Aviva Signs, MD;  Location: [facility];  Service: General;  Laterality: Right;   CATARACT EXTRACTION Right 03/28/2017   [facility]   CATARACT EXTRACTION Left 03/14/2017   [facility]   COLONOSCOPY  01/28/2013   Dr. Ok Edwards, [place], [place]; Hyperplastic polyp x2, sessile serrated adenoma x2, tubular adenoma with low-grade dysplasia x1.  Recommend repeat colonoscopy in 3 years.   COLONOSCOPY WITH PROPOFOL N/A 04/02/2020   Surgeon: Daneil Dolin, MD; Diverticulosis in the sigmoid and descending colon, cecal AVM, three 5-8 millimeters polyps in the rectum, mid rectum, and ascending colon resected and retrieved.  Pathology with 2 tubular adenomas, 1 hyperplastic polyp.  Recommended repeat colonoscopy in 5 years if health permits.   ESOPHAGOGASTRODUODENOSCOPY  04/24/2019   [facility]; 2.4 cm gastric submucosal mass in the fundus consistent with GIST.  FNA consistent with GIST.   ESOPHAGOGASTRODUODENOSCOPY  01/28/2013   Dr. Gwen Her Devers in [place], [place]; normal esophagus, hernia at GE junction, normal examined stomach, normal examined duodenum.  Does not appear dilation was performed.   ESOPHAGOGASTRODUODENOSCOPY (EGD) WITH PROPOFOL N/A 04/02/2020   Surgeon: Daneil Dolin, MD;  Erosive reflux esophagitis, somewhat dilated distal esophagus s/p 60 French Maloney dilation, stigmata of prior gastric surgery, no evidence of recurrent/persisting GIST, normal examined duodenum.    GIST tumor resection  06/19/2019   [facility], robotic assisted laparoscopic partial gastrectomy x2.  Pathology consistent with GIST   HELLER MYOTOMY  06/19/2019   [facility]; esophageal myotomy with hiatal hernia repair and dor fundoplication   left hand surgery     MALONEY DILATION N/A 04/02/2020   Procedure: Venia Minks DILATION;  Surgeon: Daneil Dolin, MD;  Location: [facility];  Service: Endoscopy;  Laterality: N/A;   POLYPECTOMY  04/02/2020    Procedure: POLYPECTOMY;  Surgeon: Daneil Dolin, MD;  Location: [facility];  Service: Endoscopy;;   tubal ligation     UMBILICAL HERNIA REPAIR     1970s   UPPER ESOPHAGEAL ENDOSCOPIC ULTRASOUND (EUS)  04/24/2019   [facility]; endoscopic findings: Tortuous esophagus s/p balloon dilation to 18 mm with no effect s/p biopsy, submucosal mass in the fundus, normal duodenum; endosonographic findings: 2.4 cm x 2.4 cm submucosal mass in the fundus s/p FNA, cholelithiasis, no hepatic, CBD, or pancreatic abnormalities.  Pathology: GIST, hepatic parenchyma with mild steato-fibrosis, reflux esophagitis   YAG laser posterior capsulotomy Left 08/30/2018   [facility], left eye.    SOCIAL HISTORY: Social History   Socioeconomic History   Marital status: Single    Spouse name: Not on file   Number of children: Not on file   Years of education: Not on file   Highest education level: Not on file  Occupational History   Occupation: retired  Tobacco Use   Smoking status: Every Day    Packs/day: 1.00    Years: 47.00    Pack years: 47.00    Types: Cigarettes   Smokeless tobacco: Never  Vaping Use   Vaping Use: Never used  Substance and Sexual Activity   Alcohol use: Yes    Comment: glass of wine occ   Drug use: Never   Sexual activity: Not Currently    Birth control/protection: Post-menopausal, Abstinence  Other Topics Concern   Not on file  Social History Narrative  Not on file   Social Determinants of Health   Financial Resource Strain: Not on file  Food Insecurity: Not on file  Transportation Needs: No Transportation Needs   Lack of Transportation (Medical): No   Lack of Transportation (Non-Medical): No  Physical Activity: Inactive   Days of Exercise per Week: 0 days   Minutes of Exercise per Session: 0 min  Stress: Not on file  Social Connections: Not on file  Intimate Partner Violence: Not At Risk   Fear of Current or Ex-Partner: No    Emotionally Abused: No   Physically Abused: No   Sexually Abused: No    FAMILY HISTORY: Family History  Problem Relation Age of Onset   Stroke Mother    Hypertension Mother    Hearing loss Mother    Cancer Mother        d. 69 "where the stomach meets the intestine" "apple-core"   Heart disease Mother    Varicose Veins Mother    Stroke Father    Heart disease Father    Hearing loss Father    Lung cancer Brother 71       smoker   Hearing loss Brother    Breast cancer Maternal Aunt        unknown age of diagnosis   Breast cancer Maternal Aunt        unknown age of diagnosis   Depression Maternal Grandmother    Diabetes Maternal Grandfather    Colon cancer Paternal Grandmother        dx in her 69s   Hypertension Daughter    Heart disease Daughter    Hearing loss Daughter    Diabetes Daughter    Depression Daughter    Vaginal cancer Daughter        dx <53   Uterine cancer Daughter        dx <53   Breast cancer Daughter        dx <53   Drug abuse Daughter    Kidney disease Daughter    Miscarriages / [place] Daughter    Breast cancer Cousin        unknown age of diagnosis (maternal first cousin)   Breast cancer Cousin        unknown age of diagnosis (maternal first cousin)   55 / [place] Granddaughter    Hearing loss Granddaughter    Drug abuse Granddaughter    Depression Granddaughter     ALLERGIES:  is allergic to somatropin, tilactase, tape, indomethacin, sulfa antibiotics, tolectin [tolmetin], penicillins, and wound dressing adhesive.  MEDICATIONS:  Current Outpatient Medications  Medication Sig Dispense Refill   acetaminophen (TYLENOL) 500 MG tablet Take 500 mg by mouth every 6 (six) hours as needed for moderate pain.     baclofen (LIORESAL) 10 MG tablet Take 10 mg by mouth 2 (two) times daily as needed.     calcium carbonate (OSCAL) 1500 (600 Ca) MG TABS tablet Take 600 mg of elemental calcium by mouth daily with breakfast.     Calcium  Carbonate-Vitamin D3 600-400 MG-UNIT TABS Take 1 tablet by mouth daily.     losartan (COZAAR) 25 MG tablet Take 25 mg by mouth daily.     pantoprazole (PROTONIX) 40 MG tablet Take 1 tablet (40 mg total) by mouth daily. 90 tablet 3   Probiotic Product ([place]) CAPS Take 1 capsule by mouth daily.     rosuvastatin (CRESTOR) 10 MG tablet Take 10 mg by mouth daily.  traMADol (ULTRAM) 50 MG tablet Take 1 tablet (50 mg total) by mouth every 6 (six) hours as needed. 20 tablet 0   No current facility-administered medications for this visit.    REVIEW OF SYSTEMS:   Review of Systems  Constitutional:  Negative for appetite change (75%) and fatigue (75%).  HENT:   Positive for trouble swallowing.   Respiratory:  Positive for shortness of breath.   Neurological:  Positive for numbness.  All other systems reviewed and are negative.  PHYSICAL EXAMINATION: ECOG PERFORMANCE STATUS: 1 - Symptomatic but completely ambulatory  There were no vitals filed for this visit. There were no vitals filed for this visit. Physical Exam Vitals reviewed.  Constitutional:      Appearance: Normal appearance.  Cardiovascular:     Rate and Rhythm: Normal rate and regular rhythm.     Pulses: Normal pulses.     Heart sounds: Normal heart sounds.  Pulmonary:     Effort: Pulmonary effort is normal.     Breath sounds: Normal breath sounds.  Abdominal:     Palpations: Abdomen is soft. There is no hepatomegaly, splenomegaly or mass.     Tenderness: There is no abdominal tenderness.  Musculoskeletal:     Right lower leg: No edema.     Left lower leg: No edema.  Lymphadenopathy:     Lower Body: No right inguinal adenopathy. No left inguinal adenopathy.  Neurological:     General: No focal deficit present.     Mental Status: She is alert and oriented to person, place, and time.  Psychiatric:        Mood and Affect: Mood normal.        Behavior: Behavior normal.    Breast Exam Chaperone: Thana Ates    LABORATORY DATA:  I have reviewed the data as listed Recent Results (from the past 2160 hour(s))  CBC WITH DIFFERENTIAL     Status: Abnormal   Collection Time: 10/07/20 11:38 AM  Result Value Ref Range   WBC 9.0 4.0 - 10.5 K/uL   RBC 3.59 (L) 3.87 - 5.11 MIL/uL   Hemoglobin 12.1 12.0 - 15.0 g/dL   HCT 36.4 36.0 - 46.0 %   MCV 101.4 (H) 80.0 - 100.0 fL   MCH 33.7 26.0 - 34.0 pg   MCHC 33.2 30.0 - 36.0 g/dL   RDW 14.5 11.5 - 15.5 %   Platelets 217 150 - 400 K/uL   nRBC 0.0 0.0 - 0.2 %   Neutrophils Relative % 70 %   Neutro Abs 6.3 1.7 - 7.7 K/uL   Lymphocytes Relative 19 %   Lymphs Abs 1.7 0.7 - 4.0 K/uL   Monocytes Relative 7 %   Monocytes Absolute 0.7 0.1 - 1.0 K/uL   Eosinophils Relative 4 %   Eosinophils Absolute 0.4 0.0 - 0.5 K/uL   Basophils Relative 0 %   Basophils Absolute 0.0 0.0 - 0.1 K/uL   Immature Granulocytes 0 %   Abs Immature Granulocytes 0.03 0.00 - 0.07 K/uL    Comment: Performed at [facility], [address], [place], [place] [address]  Basic metabolic panel     Status: None   Collection Time: 10/07/20 11:38 AM  Result Value Ref Range   Sodium 140 135 - 145 mmol/L   Potassium 3.7 3.5 - 5.1 mmol/L   Chloride 107 98 - 111 mmol/L   CO2 28 22 - 32 mmol/L   Glucose, Bld 97 70 - 99 mg/dL    Comment: Glucose  reference range applies only to samples taken after fasting for at least 8 hours.   BUN 12 8 - 23 mg/dL   Creatinine, Ser 0.69 0.44 - 1.00 mg/dL   Calcium 9.0 8.9 - 10.3 mg/dL   GFR, Estimated >60 >60 mL/min    Comment: (NOTE) Calculated using the CKD-EPI Creatinine Equation (2021)    Anion gap 5 5 - 15    Comment: Performed at [facility], [address] [place], [place] [address]  Hemoglobin A1c     Status: None   Collection Time: 11/06/20  7:13 AM  Result Value Ref Range   Hgb A1c MFr Bld 5.2 4.8 - 5.6 %    Comment: (NOTE) Pre diabetes:          5.7%-6.4%  Diabetes:              >6.4%  Glycemic control for   <7.0% adults with  diabetes    Mean Plasma Glucose 102.54 mg/dL    Comment: Performed at [facility] [place] [address] [place], [place] [address]  Surgical pathology     Status: None   Collection Time: 11/06/20  7:18 AM  Result Value Ref Range   SURGICAL PATHOLOGY      SURGICAL PATHOLOGY CASE: [id number] PATIENT: [facility] Surgical Pathology Report     Clinical History: atypical ductal hyperplasia     FINAL MICROSCOPIC DIAGNOSIS:  A. BREAST, RIGHT, EXCISION: - Ductal carcinoma in situ with calcifications. - Margins negative for DCIS. - See oncology table and comment. - Biopsy site and biopsy clip.  ONCOLOGY TABLE: DCIS OF THE BREAST:  Resection Procedure: Right breast lumpectomy. Specimen Laterality: Right breast. Histologic Type: Ductal carcinoma in situ. Size of DCIS: 1.5 cm, glass slide measurement. Nuclear Grade: Intermediate. Necrosis: No. Margins: All surgical margins negative for DCIS.      Specify Closest Margin (required only if <28m): 15 mm, inferior. Regional Lymph Nodes: No lymph nodes submitted.      Breast Biomarker Testing Performed on Previous Biopsy:      Testing performed on Case Number: Pending.      Estrogen Receptor: Pending.      Progesterone Receptor: Pending. Pathologic Stage Cla ssification (pTNM, AJCC 8th Edition): pTis, pN not assigned Representative Tumor Block: A1 and A2 Comment(s):  Immunohistochemistry for cytokeratin 5/6 supports the diagnosis. (v4.4.0.0)  GROSS DESCRIPTION: Specimen type: Right breast excision, in formalin at 0807 hrs. Size: 5 cm from anterior to posterior, 2.5 cm from superior to inferior, 2.4 cm from medial to lateral. Orientation: Green Anterior, Blue Inferior, Orange Lateral, Yellow Medial, Black Posterior, Red Superior. Localized area: None.  An RF device is separate within the container. Cut surface: The specimen consists predominantly of soft fatty tissue, with gray-white soft to vaguely nodular fibrous  tissue scattered throughout the specimen.  Embedded within 1 area of nodular tissue at the inferior margin is a cylindrical clip.  A discrete mass lesion is not identified. Margins: The clip is embedded within the inferior margin. Prognostic indicators: Not applicable Block summary: Blocks 1, 2 = tissue cont aining clip, and nearest inferior and posterior margins Block 3 = additional inferior margin near clip Block 4 = additional posterior margin near clip Block 5 = superior margin nearest tissue with clip Block 6 = anterior margin nearest tissue with clip Block 7 = medial margin nearest tissue with clip Block 8 = lateral margin nearest tissue with clip SW 11/06/2020  Final Diagnosis performed by JClaudette Laws MD.   Electronically  signed 11/09/2020 Technical component performed at [facility], [place] [address], [place], [place] [address].  Professional component performed at [facility], [place] [address], [place], [place] [address].  Immunohistochemistry Technical component (if applicable) was performed at [facility]. [address], [place], [place], [place] [address].   IMMUNOHISTOCHEMISTRY DISCLAIMER (if applicable): Some of these immunohistochemical stains may have been developed and the performance characteris tics determine by [facility]. Some may not have been cleared or approved by the U.S. Food and Drug Administration. The FDA has determined that such clearance or approval is not necessary. This test is used for clinical purposes. It should not be regarded as investigational or for research. This laboratory is certified under the [place] (CLIA-88) as qualified to perform high complexity clinical laboratory testing.  The controls stained appropriately.   Lactate dehydrogenase     Status: None   Collection Time: 11/13/20 12:54 PM  Result Value Ref Range   LDH  128 98 - 192 U/L    Comment: Performed at [facility], [address] [place], [place] [address]  CBC with Differential/Platelet     Status: Abnormal   Collection Time: 11/13/20 12:54 PM  Result Value Ref Range   WBC 12.2 (H) 4.0 - 10.5 K/uL   RBC 4.18 3.87 - 5.11 MIL/uL   Hemoglobin 14.1 12.0 - 15.0 g/dL   HCT 40.8 36.0 - 46.0 %   MCV 97.6 80.0 - 100.0 fL   MCH 33.7 26.0 - 34.0 pg   MCHC 34.6 30.0 - 36.0 g/dL   RDW 13.2 11.5 - 15.5 %   Platelets 267 150 - 400 K/uL   nRBC 0.0 0.0 - 0.2 %   Neutrophils Relative % 67 %   Neutro Abs 8.2 (H) 1.7 - 7.7 K/uL   Lymphocytes Relative 17 %   Lymphs Abs 2.1 0.7 - 4.0 K/uL   Monocytes Relative 9 %   Monocytes Absolute 1.1 (H) 0.1 - 1.0 K/uL   Eosinophils Relative 6 %   Eosinophils Absolute 0.7 (H) 0.0 - 0.5 K/uL   Basophils Relative 1 %   Basophils Absolute 0.1 0.0 - 0.1 K/uL   Immature Granulocytes 0 %   Abs Immature Granulocytes 0.05 0.00 - 0.07 K/uL    Comment: Performed at [facility], [address], [place], [place] [address]  Comprehensive metabolic panel     Status: Abnormal   Collection Time: 11/13/20 12:54 PM  Result Value Ref Range   Sodium 140 135 - 145 mmol/L   Potassium 4.1 3.5 - 5.1 mmol/L   Chloride 107 98 - 111 mmol/L   CO2 26 22 - 32 mmol/L   Glucose, Bld 88 70 - 99 mg/dL    Comment: Glucose reference range applies only to samples taken after fasting for at least 8 hours.   BUN 12 8 - 23 mg/dL   Creatinine, Ser 0.59 0.44 - 1.00 mg/dL   Calcium 9.6 8.9 - 10.3 mg/dL   Total Protein 6.8 6.5 - 8.1 g/dL   Albumin 4.1 3.5 - 5.0 g/dL   AST 13 (L) 15 - 41 U/L   ALT 14 0 - 44 U/L   Alkaline Phosphatase 58 38 - 126 U/L   Total Bilirubin 0.9 0.3 - 1.2 mg/dL   GFR, Estimated >60 >60 mL/min    Comment: (NOTE) Calculated using the CKD-EPI Creatinine Equation (2021)    Anion gap 7 5 - 15    Comment:  Performed at [facility], [address] [place], [place] [address]    RADIOGRAPHIC STUDIES: I have personally reviewed  the radiological reports and agreed with the findings in the report. MM BREAST SURGICAL SPECIMEN  Result Date: 11/06/2020 CLINICAL DATA:  Status post radiofrequency tag localized right breast lumpectomy. EXAM: SPECIMEN RADIOGRAPH OF THE right BREAST COMPARISON:  Previous exam(s). FINDINGS: Status post excision of the right breast. The cylinder shaped clip are present within the specimen. The RF tag is not identified with the specimen. This was discussed via telephone with order provider Dr. Marlon Pel in the [place]. He informed me that the RF tag fell out of the specimen but is accounted for. Therefore, no additional specimens will be sent. IMPRESSION: Specimen radiograph of the right breast. Electronically Signed   By: Kristopher Oppenheim M.D.   On: 11/06/2020 08:10    ASSESSMENT:  Right breast DCIS: - Had initial biopsy on 09/06/2018 one of the right breast in [place] consistent with ADH. - On 11/06/2020, lumpectomy. - Pathology consistent with 1.5 cm DCIS, margins negative, intermediate grade, pTis P NX.  Social/family history: - She is retired [employer] manager.  Quit smoking 3 years ago, smoked 1 pack/day. - Mother had colon cancer, brother had lung cancer.  Maternal cousin had ovarian/cervical cancer.  Daughter had ovarian/cervical/vaginal cancer.  Maternal aunt had breast cancer.  3.  Multifocal gastric GIST tumors: -[facility] CAP on 05/30/2019 at [facility] in [place] shows 4 x 2 x 2 cm mass in fundus with no metastatic disease.  6 mm solitary pulmonary nodule in the left upper lobe. -On 06/19/2019-partial gastrectomy x2, hiatal hernia repair, esophageal myotomy - Pathology shows 2 gastrointestinal stromal tumors, smaller lesion measuring 0.7 cm and larger lesion measuring 4.7 cm, mitotic rate less than 5/48m2 for both lesions, low-grade, very low risk, no lymph nodes found (PT 1 and PT 2 N0). -[facility]  400 mg daily. - She appears to be in the very low risk range with a 1.9% metastatic rate.  This prognostic assessment applies best to KIT negative or PDGFR positive GIST's, whereas SDH deficient GIST's are more unpredictable.  Unsure why she was started on imatinib.  Was possible that she was part of clinical trial.   PLAN:  Right breast DCIS: - We have reviewed pathology report with the patient and her daughter in detail.  Her granddaughter was also present on the phone throughout the consultation. - We will follow-up on the ER/PR receptor status. - If it is positive for estrogen, recommend antiestrogen therapy. - I have also recommended radiation consultation. - Recommend bone density test and vitamin D levels. - She already had [facility] multi cancer panel genetic testing which was negative. - RTC 4 to 5 weeks for follow-up.  2.  Multifocal gastric GIST tumors: - CT CAP on 05/20/2020 did not show any evidence of recurrence or metastatic disease.  6 mm left upper lobe lung nodule identified. - Based on her very low risk disease and low percent metastatic rate, imatinib was discontinued in October. - Continue close monitoring.   SDerek Jack MD 11/13/20 2:13 PM  [phone]  I, KThana Ates am acting as a scribe for Dr. SDerek Jack  I, SDerek JackMD, have reviewed the above documentation for accuracy and completeness, and I agree with the above.

## 2020-11-13 ENCOUNTER — Inpatient Hospital Stay (HOSPITAL_COMMUNITY): Payer: Medicare Other

## 2020-11-13 ENCOUNTER — Encounter (HOSPITAL_COMMUNITY): Payer: Self-pay | Admitting: Hematology

## 2020-11-13 ENCOUNTER — Other Ambulatory Visit: Payer: Self-pay

## 2020-11-13 ENCOUNTER — Inpatient Hospital Stay (HOSPITAL_COMMUNITY): Payer: Medicare Other | Attending: Hematology and Oncology | Admitting: Hematology

## 2020-11-13 DIAGNOSIS — F17210 Nicotine dependence, cigarettes, uncomplicated: Secondary | ICD-10-CM | POA: Diagnosis not present

## 2020-11-13 DIAGNOSIS — R918 Other nonspecific abnormal finding of lung field: Secondary | ICD-10-CM | POA: Insufficient documentation

## 2020-11-13 DIAGNOSIS — Z79899 Other long term (current) drug therapy: Secondary | ICD-10-CM | POA: Diagnosis not present

## 2020-11-13 DIAGNOSIS — D0511 Intraductal carcinoma in situ of right breast: Secondary | ICD-10-CM

## 2020-11-13 DIAGNOSIS — Z801 Family history of malignant neoplasm of trachea, bronchus and lung: Secondary | ICD-10-CM | POA: Insufficient documentation

## 2020-11-13 DIAGNOSIS — Z803 Family history of malignant neoplasm of breast: Secondary | ICD-10-CM | POA: Insufficient documentation

## 2020-11-13 DIAGNOSIS — Z8041 Family history of malignant neoplasm of ovary: Secondary | ICD-10-CM | POA: Insufficient documentation

## 2020-11-13 DIAGNOSIS — C49A2 Gastrointestinal stromal tumor of stomach: Secondary | ICD-10-CM | POA: Insufficient documentation

## 2020-11-13 DIAGNOSIS — Z800 Family history of malignant neoplasm of digestive organs: Secondary | ICD-10-CM | POA: Diagnosis not present

## 2020-11-13 DIAGNOSIS — Z8509 Personal history of malignant neoplasm of other digestive organs: Secondary | ICD-10-CM | POA: Diagnosis not present

## 2020-11-13 DIAGNOSIS — N6091 Unspecified benign mammary dysplasia of right breast: Secondary | ICD-10-CM | POA: Diagnosis not present

## 2020-11-13 DIAGNOSIS — Z8049 Family history of malignant neoplasm of other genital organs: Secondary | ICD-10-CM | POA: Diagnosis not present

## 2020-11-13 DIAGNOSIS — C49A4 Gastrointestinal stromal tumor of large intestine: Secondary | ICD-10-CM | POA: Diagnosis not present

## 2020-11-13 HISTORY — DX: Intraductal carcinoma in situ of right breast: D05.11

## 2020-11-13 LAB — COMPREHENSIVE METABOLIC PANEL
ALT: 14 U/L (ref 0–44)
AST: 13 U/L — ABNORMAL LOW (ref 15–41)
Albumin: 4.1 g/dL (ref 3.5–5.0)
Alkaline Phosphatase: 58 U/L (ref 38–126)
Anion gap: 7 (ref 5–15)
BUN: 12 mg/dL (ref 8–23)
CO2: 26 mmol/L (ref 22–32)
Calcium: 9.6 mg/dL (ref 8.9–10.3)
Chloride: 107 mmol/L (ref 98–111)
Creatinine, Ser: 0.59 mg/dL (ref 0.44–1.00)
GFR, Estimated: >60 mL/min (ref >60)
Glucose, Bld: 88 mg/dL (ref 70–99)
Potassium: 4.1 mmol/L (ref 3.5–5.1)
Sodium: 140 mmol/L (ref 135–145)
Total Bilirubin: 0.9 mg/dL (ref 0.3–1.2)
Total Protein: 6.8 g/dL (ref 6.5–8.1)

## 2020-11-13 LAB — CBC WITH DIFFERENTIAL/PLATELET
Abs Immature Granulocytes: 0.05 10*3/uL (ref 0.00–0.07)
Basophils Absolute: 0.1 10*3/uL (ref 0.0–0.1)
Basophils Relative: 1 %
Eosinophils Absolute: 0.7 10*3/uL — ABNORMAL HIGH (ref 0.0–0.5)
Eosinophils Relative: 6 %
HCT: 40.8 % (ref 36.0–46.0)
Hemoglobin: 14.1 g/dL (ref 12.0–15.0)
Immature Granulocytes: 0 %
Lymphocytes Relative: 17 %
Lymphs Abs: 2.1 10*3/uL (ref 0.7–4.0)
MCH: 33.7 pg (ref 26.0–34.0)
MCHC: 34.6 g/dL (ref 30.0–36.0)
MCV: 97.6 fL (ref 80.0–100.0)
Monocytes Absolute: 1.1 10*3/uL — ABNORMAL HIGH (ref 0.1–1.0)
Monocytes Relative: 9 %
Neutro Abs: 8.2 10*3/uL — ABNORMAL HIGH (ref 1.7–7.7)
Neutrophils Relative %: 67 %
Platelets: 267 10*3/uL (ref 150–400)
RBC: 4.18 MIL/uL (ref 3.87–5.11)
RDW: 13.2 % (ref 11.5–15.5)
WBC: 12.2 10*3/uL — ABNORMAL HIGH (ref 4.0–10.5)
nRBC: 0 % (ref 0.0–0.2)

## 2020-11-13 LAB — VITAMIN D 25 HYDROXY (VIT D DEFICIENCY, FRACTURES): Vit D, 25-Hydroxy: 37.86 ng/mL (ref 30–100)

## 2020-11-13 LAB — LACTATE DEHYDROGENASE: LDH: 128 U/L (ref 98–192)

## 2020-11-13 NOTE — Patient Instructions (Addendum)
[place] at [facility] Discharge Instructions  You were seen and examined today by Dr. Delton Coombes. Dr. Delton Coombes is a medical oncologist, meaning that he specializes in cancer diagnoses. Dr. Delton Coombes discussed your past medical history, family history of cancers, and the events that led to you being here today.  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 The tumor was completely removed, this is good news. Because you have had this once, it is a risk of pre-cancer, or cancer returning. Radiation is recommended in some cases, Dr. Delton Coombes will refer you to [facility] for Radiation discussion. If it is estrogen receptor positive, you will need to go on an antiestrogen pill for at least 5 years.  Follow-up as scheduled.   Thank you for choosing [place] at [facility] to provide your oncology and hematology care.  To afford each patient quality time with our provider, please arrive at least 15 minutes before your scheduled appointment time.   If you have a lab appointment with the [facility] please come in thru the Main Entrance and check in at the main information desk.  You need to re-schedule your appointment should you arrive 10 or more minutes late.  We strive to give you quality time with our providers, and arriving late affects you and other patients whose appointments are after yours.  Also, if you no show three or more times for appointments you may be dismissed from the clinic at the providers discretion.     Again, thank you for choosing [facility].  Our hope is that these requests will decrease the amount of time that you wait before being seen by our physicians.       _____________________________________________________________  Should you have questions after your visit to [facility], please contact our office at [phone] and follow the prompts.  Our office  hours are 8:00 a.m. and 4:30 p.m. Monday - Friday.  Please note that voicemails left after 4:00 p.m. may not be returned until the following business day.  We are closed weekends and major holidays.  You do have access to a nurse 24-7, just call the main number to the clinic [phone] and do not press any options, hold on the line and a nurse will answer the phone.    For prescription refill requests, have your pharmacy contact our office and allow 72 hours.    Due to Covid, you will need to wear a mask upon entering the hospital. If you do not have a mask, a mask will be given to you at the Main Entrance upon arrival. For doctor visits, patients may have 1 support person age 74 or older with them. For treatment visits, patients can not have anyone with them due to social distancing guidelines and our immunocompromised population.

## 2020-11-16 ENCOUNTER — Encounter (HOSPITAL_COMMUNITY): Payer: Self-pay | Admitting: Lab

## 2020-11-16 NOTE — Progress Notes (Unsigned)
Referral sent to [facility]. Records faxed on 11/7

## 2020-11-18 LAB — SURGICAL PATHOLOGY

## 2020-11-25 DIAGNOSIS — Z800 Family history of malignant neoplasm of digestive organs: Secondary | ICD-10-CM | POA: Insufficient documentation

## 2020-11-25 DIAGNOSIS — R911 Solitary pulmonary nodule: Secondary | ICD-10-CM

## 2020-11-25 DIAGNOSIS — Z803 Family history of malignant neoplasm of breast: Secondary | ICD-10-CM | POA: Insufficient documentation

## 2020-11-25 DIAGNOSIS — Z8049 Family history of malignant neoplasm of other genital organs: Secondary | ICD-10-CM | POA: Insufficient documentation

## 2020-11-25 DIAGNOSIS — M5126 Other intervertebral disc displacement, lumbar region: Secondary | ICD-10-CM

## 2020-11-25 HISTORY — DX: Solitary pulmonary nodule: R91.1

## 2020-11-25 HISTORY — DX: Other intervertebral disc displacement, lumbar region: M51.26

## 2020-11-27 ENCOUNTER — Ambulatory Visit (HOSPITAL_COMMUNITY)
Admission: RE | Admit: 2020-11-27 | Discharge: 2020-11-27 | Disposition: A | Discharge status: Home / Self Care | Payer: Medicare Other | Source: Ambulatory Visit | Attending: Hematology | Admitting: Hematology

## 2020-11-27 ENCOUNTER — Other Ambulatory Visit: Payer: Self-pay

## 2020-11-27 DIAGNOSIS — Z780 Asymptomatic menopausal state: Secondary | ICD-10-CM | POA: Insufficient documentation

## 2020-11-27 DIAGNOSIS — F17200 Nicotine dependence, unspecified, uncomplicated: Secondary | ICD-10-CM | POA: Insufficient documentation

## 2020-11-27 DIAGNOSIS — Z13820 Encounter for screening for osteoporosis: Secondary | ICD-10-CM | POA: Insufficient documentation

## 2020-11-27 DIAGNOSIS — D0511 Intraductal carcinoma in situ of right breast: Secondary | ICD-10-CM | POA: Insufficient documentation

## 2020-11-27 DIAGNOSIS — M85852 Other specified disorders of bone density and structure, left thigh: Secondary | ICD-10-CM | POA: Insufficient documentation

## 2020-12-02 ENCOUNTER — Ambulatory Visit (HOSPITAL_COMMUNITY): Payer: Medicare Other

## 2020-12-02 ENCOUNTER — Encounter (HOSPITAL_COMMUNITY): Payer: Self-pay

## 2020-12-02 ENCOUNTER — Inpatient Hospital Stay (HOSPITAL_COMMUNITY): Payer: Medicare Other

## 2020-12-09 ENCOUNTER — Ambulatory Visit (HOSPITAL_COMMUNITY): Payer: Medicare Other | Admitting: Hematology

## 2021-03-18 NOTE — Progress Notes (Signed)
Referring Provider: Leonie Douglas, MD Primary Care Physician:  Leonie Douglas, MD Primary GI Physician: Dr. Gala Romney  Chief Complaint  Patient presents with   Follow-up    HPI:   Cassandra Fowler is a 76 y.o. female presenting today for follow-up.    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 BPE January 2022 with slight dilation of distal esophagus just above the GE junction potentially related to prior myotomy and achalasia, no mass or stricture, diffuse dysmotility. EGD 04/02/20: Erosive reflux esophagitis, somewhat dilated distal esophagus s/p 60 French Maloney dilation, stigmata of prior gastric surgery, no evidence of recurrent/persisting GIST, normal examined duodenum. Recommended PPI daily and now following with [facility] for achalasia.   Also with history of steatohepatitis/static fibrosis noted on GIST tumor biopsies in 2021 as these biopsies also contained cores of hepatic parenchyma. Abdominal ultrasound with elastography 02/04/2020 with diffusely increased parenchymal echogenicity.  kPa 1.6.    History of gallbladder adenomyomatosis, dilated CBD.  MRI/MRCP March 2022 without obvious filling defect.  Prior EUS in April 2021 in [place] after CT also noted dilated CBD revealing no significant liver, biliary, or pancreatic abnormalities.  Recommended monitoring LFTs.  Last seen in our office 05/13/2020.  Noted mild improvement in dysphagia following dilation, but continued with intermittent symptoms with solids and liquids, occasional regurgitation.  GERD fairly well controlled on pantoprazole 40 mg daily.  No other significant GI symptoms.  Her LFTs were within normal limits in April 2022.  Plan included  referral to [facility] for management of dysphagia/achalasia, continue Protonix 40 mg daily, repeat HFP in June, counseled on fatty liver, and follow-up ultrasound on prior suspected gallbladder polyp versus adenomyomatosis was arranged.  LFTs again within normal limits.  Ultrasound was completed in September 2022 consistent with adenomyomatosis which did not need follow-up.  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  She has established with [facility].  She had a timed barium which suggested distal clearance was not an issue.  Stated she could be a candidate for complete myotomy, but would need repeat manometry to confirm spasm.  Patient strongly wants to avoid repeat manometry.  She was scheduled for EGD with spiral Botox, currently scheduled for 05/03/21.  Today:   GERD: Well controlled on Prilosec 40 mg daily.   Dysphagia:  Following with [facility]. Still has ongoing sympotms. Regurgitates during or after meals. Occurrs with anything including liquid. Trying to eat slower and take small bites.   History of GIST:  Following with oncology.  Dr. Gala Romney previously mentioned the need for periodic EGDs due to increased risk of esophageal cancer and just surveillance.  Will need to discuss.  Dilated CBD:  LFTs normal in November 2022. No abdominal pain.   Fatty liver:  Has been trying to lose weight. Cutting out snacks/sweets. Trying to eat healthier overall. Down 8 lbs since Iast saw her. Plans to start walking soon.   Had labs a couple weeks ago at PCPs office.    Bowels moving well without brbpr or melena.   Planning to start radiation for breast surgery soon.   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   BREAST BIOPSY Right 11/06/2020   Procedure: BREAST BIOPSY;  Surgeon: Aviva Signs, MD;  Location: [facility] ORS;  Service: General;  Laterality: Right;   BREAST LUMPECTOMY WITH RADIOFREQUENCY TAG IDENTIFICATION Right 11/06/2020   Procedure: BREAST LUMPECTOMY WITH RADIOFREQUENCY TAG IDENTIFICATION;  Surgeon: Aviva Signs, MD;  Location: [facility] ORS;  Service: General;  Laterality: Right;   CATARACT EXTRACTION Right 03/28/2017   [facility]   CATARACT EXTRACTION Left 03/14/2017   [facility]   COLONOSCOPY  01/28/2013   Dr. Ok Edwards, [place], [place]; Hyperplastic polyp x2, sessile serrated adenoma x2, tubular adenoma with low-grade dysplasia x1.  Recommend repeat colonoscopy in 3 years.   COLONOSCOPY WITH PROPOFOL N/A 04/02/2020   Surgeon: Daneil Dolin, MD; Diverticulosis in the sigmoid and descending colon, cecal AVM, three 5-8 millimeters polyps in the rectum, mid rectum, and ascending colon resected and retrieved.  Pathology with 2 tubular adenomas, 1 hyperplastic polyp.  Recommended repeat colonoscopy in 5 years if health permits.   ESOPHAGOGASTRODUODENOSCOPY  04/24/2019   [place]; 2.4 cm gastric submucosal mass in the fundus consistent with GIST.  FNA consistent with GIST.   ESOPHAGOGASTRODUODENOSCOPY  01/28/2013   Dr. Gwen Her Devers in [place], [place]; normal esophagus, hernia at GE junction, normal examined stomach, normal examined duodenum.  Does not appear  dilation was performed.   ESOPHAGOGASTRODUODENOSCOPY (EGD) WITH PROPOFOL N/A 04/02/2020   Surgeon: Daneil Dolin, MD;  Erosive reflux esophagitis, somewhat dilated distal esophagus s/p 60 French Maloney dilation, stigmata of prior gastric surgery, no evidence of recurrent/persisting GIST, normal examined duodenum.    GIST tumor resection  06/19/2019   [facility], robotic assisted laparoscopic partial gastrectomy x2.  Pathology consistent with GIST   HELLER MYOTOMY  06/19/2019   [facility]; esophageal myotomy with hiatal hernia repair and dor fundoplication   left hand surgery     MALONEY DILATION N/A 04/02/2020   Procedure: Venia Minks DILATION;  Surgeon: Daneil Dolin, MD;  Location: [facility] END[facility] SUITE;  Service: Endoscopy;  Laterality: N/A;   POLYPECTOMY  04/02/2020   Procedure: POLYPECTOMY;  Surgeon: Daneil Dolin, MD;  Location: [facility] SU[facility];  Service: Endoscopy;;   tubal ligation     UMBILICAL HERNIA REPAIR     1970s   UPPER ESOPHAGEAL ENDOSCOPIC ULTRASOUND (EUS)  04/24/2019   [facility]; endoscopic findings: Tortuous esophagus s/p balloon dilation to 18 mm with no effect s/p biopsy, submucosal mass in the fundus, normal duodenum; endosonographic findings: 2.4 cm x 2.4 cm submucosal mass in the fundus s/p FNA, cholelithiasis, no hepatic, CBD, or pancreatic abnormalities.  Pathology: GIST, hepatic parenchyma with mild steato-fibrosis, reflux esophagitis   YAG laser posterior capsulotomy Left 08/30/2018   [facility] left eye.  Current Outpatient Medications  Medication Sig Dispense Refill   acetaminophen (TYLENOL) 500 MG tablet Take 500 mg by mouth every 6 (six) hours as needed for moderate pain.     baclofen (LIORESAL) 10 MG tablet Take 10 mg by mouth 2 (two) times daily as needed.     Calcium Carbonate-Vitamin D3 600-400 MG-UNIT TABS Take 1 tablet by mouth daily.     losartan (COZAAR) 25 MG tablet Take 25 mg by mouth  daily.     Probiotic Product ([place]) CAPS Take 1 capsule by mouth daily.     rosuvastatin (CRESTOR) 10 MG tablet Take 10 mg by mouth daily.     pantoprazole (PROTONIX) 40 MG tablet Take 1 tablet (40 mg total) by mouth daily. 90 tablet 3   No current facility-administered medications for this visit.    Allergies as of 03/19/2021 - Review Complete 03/19/2021  Allergen Reaction Noted   Somatropin Anaphylaxis 09/02/2020   Tilactase Anaphylaxis 09/02/2020   Tape Rash 03/05/2020   Indomethacin Hives 01/17/2020   Sulfa antibiotics Hives 09/30/2020   Tolectin [tolmetin] Hives 01/17/2020   Penicillins Hives and Swelling 01/17/2020   Wound dressing adhesive Rash 01/17/2020    Family History  Problem Relation Age of Onset   Stroke Mother    Hypertension Mother    Hearing loss Mother    Cancer Mother        d. 62 "where the stomach meets the intestine" "apple-core"   Heart disease Mother    Varicose Veins Mother    Stroke Father    Heart disease Father    Hearing loss Father    Lung cancer Brother 90       smoker   Hearing loss Brother    Breast cancer Maternal Aunt        unknown age of diagnosis   Breast cancer Maternal Aunt        unknown age of diagnosis   Depression Maternal Grandmother    Diabetes Maternal Grandfather    Colon cancer Paternal Grandmother        dx in her 58s   Hypertension Daughter    Heart disease Daughter    Hearing loss Daughter    Diabetes Daughter    Depression Daughter    Vaginal cancer Daughter        dx <53   Uterine cancer Daughter        dx <53   Breast cancer Daughter        dx <53   Drug abuse Daughter    Kidney disease Daughter    Miscarriages / [place] Daughter    Breast cancer Cousin        unknown age of diagnosis (maternal first cousin)   Breast cancer Cousin        unknown age of diagnosis (maternal first cousin)   54 / [place] Granddaughter    Hearing loss Granddaughter    Drug abuse  Granddaughter    Depression Granddaughter     Social History   Socioeconomic History   Marital status: Single    Spouse name: Not on file   Number of children: Not on file   Years of education: Not on file   Highest education level: Not on file  Occupational History   Occupation: retired  Tobacco Use   Smoking status: Every Day    Packs/day: 1.00    Years: 47.00    Pack years: 47.00    Types: Cigarettes   Smokeless tobacco: Never  Vaping Use   Vaping Use: Never used  Substance and Sexual Activity   Alcohol use: Yes    Comment: glass of wine occ   Drug use: Never   Sexual activity: Not Currently    Birth control/protection: Post-menopausal, Abstinence  Other Topics Concern   Not on file  Social History Narrative   Not on file   Social Determinants of Health   Financial Resource Strain: Not on file  Food Insecurity: Not on file  Transportation Needs: Not on file  Physical Activity: Not on file  Stress: Not on file  Social Connections: Not on file    Review of Systems: Gen: Denies fever, chills, cold or flulike symptoms, presyncope, syncope. CV: Denies chest pain, palpitations. Resp: Admits to chronic shortness of breath with exertion.  No shortness of breath at rest.  Occasional cough. GI: See HPI Heme: See HPI  Physical Exam: BP (!) 146/90    Pulse 86    Temp (!) 97.2 F (36.2 C) (Temporal)    Ht 5' 10"  (1.778 m)    Wt 158 lb (71.7 kg)    BMI 22.67 kg/m  General:   Alert and oriented. No distress noted. Pleasant and cooperative.  Head:  Normocephalic and atraumatic. Eyes:  Conjuctiva clear without scleral icterus. Heart:  S1, S2 present without murmurs appreciated. Lungs: Diffuse wheezing throughout.  No rales or rhonchi.  No distress. Abdomen:  +BS, soft, non-tender and non-distended. No rebound or guarding. No HSM or masses noted. Msk:  Symmetrical without gross deformities. Normal posture. Extremities:  Without edema. Neurologic:  Alert and  oriented  x4 Psych:   Normal mood and affect.   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  GERD: With reflux esophagitis on EGD in March 2022.  Symptoms now well controlled on PPI daily.  Dysphagia: Chronic.  History of achalasia s/p Heller myotomy and hiatal hernia repair with fundoplication in [place] in June 2021.  BPE January 2022 with slight dilation of distal esophagus just above GE junction potentially related to prior myotomy and achalasia, diffuse dysmotility.  EGD locally in March 2022 with erosive reflux esophagitis, somewhat dilated distal esophagus s/p 60 French Maloney dilation.  Now following with [facility] due to ongoing symptoms with plans for EGD with spiral Botox on 05/03/2021.   History of GIST gastric tumor: S/p resection x2 in June 2021 in [place].  She is following with oncology locally who is monitoring.  EGD in March 2022 with no evidence of recurrent/persisting GIST.  Notably, she is also scheduled for EGD on 05/03/2021 with [facility] due to dysphagia as per above.  Dilated CBD:  MRI/MRCP March 2022 without obvious filling defect.  Prior EUS in April 2021 in [place] after CT also noted dilated CBD revealing no significant liver, biliary, or pancreatic abnormalities.  We have been monitoring LFTs, which have remained wnl, most recently in November 2022.  Patient reports completing blood work at 4 weeks ago with PCP.  Will request for review.  Fatty liver: History of steatohepatitis/static fibrosis noted on GIST tumor biopsies in 2021 as these biopsies also contained cores of hepatic parenchyma. Abdominal ultrasound  with elastography 02/04/2020 with diffusely increased parenchymal echogenicity.  kPa 1.6.  Clinically, she is doing well. Her symptoms of advanced liver disease.  She is down 8 pounds intentionally over the last year.  LFTs in November 2022 within normal limits.    Plan:  Continue Protonix 40 mg daily.  Refill sent to pharmacy today. Keep upcoming appointment with [facility] for EGD with Botox. Counseled on fatty liver.  Separate written instructions provided. Request recent blood work from PCP. Follow-up in 6 months or sooner if needed.    Aliene Altes, PA-C [facility] 03/19/2021

## 2021-03-19 ENCOUNTER — Ambulatory Visit (INDEPENDENT_AMBULATORY_CARE_PROVIDER_SITE_OTHER): Payer: Medicare Other | Admitting: Gastroenterology

## 2021-03-19 ENCOUNTER — Encounter: Payer: Self-pay | Admitting: Gastroenterology

## 2021-03-19 ENCOUNTER — Other Ambulatory Visit: Payer: Self-pay

## 2021-03-19 VITALS — BP 146/90 | HR 86 | Temp 97.2°F | Ht 70.0 in | Wt 158.0 lb

## 2021-03-19 DIAGNOSIS — C49A4 Gastrointestinal stromal tumor of large intestine: Secondary | ICD-10-CM

## 2021-03-19 DIAGNOSIS — K838 Other specified diseases of biliary tract: Secondary | ICD-10-CM

## 2021-03-19 DIAGNOSIS — R1310 Dysphagia, unspecified: Secondary | ICD-10-CM

## 2021-03-19 DIAGNOSIS — K760 Fatty (change of) liver, not elsewhere classified: Secondary | ICD-10-CM

## 2021-03-19 DIAGNOSIS — K2100 Gastro-esophageal reflux disease with esophagitis, without bleeding: Secondary | ICD-10-CM

## 2021-03-19 MED ORDER — PANTOPRAZOLE SODIUM 40 MG PO TBEC: 40.0000 mg | DELAYED_RELEASE_TABLET | Freq: Every day | ORAL | 3 refills | Status: AC

## 2021-03-19 NOTE — Patient Instructions (Signed)
Continue Protonix 40 mg daily 30 minutes before breakfast. ? ?Keep your upcoming appointment with [facility] for your swallowing problems. ? ?I am requesting your recent blood work from your primary care provider to review.  I will let you know if we need any additional labs. ? ?Instructions for fatty liver: ?Low fat/cholesterol diet.   ?Avoid sweets, sodas, fruit juices, sweetened beverages like tea, etc. ?Gradually increase exercise from 15 min daily up to 1 hr per day 5 days/week. ?Limit alcohol use. ? ?Congratulations on your weight loss! ? ?Aliene Altes, PA-C ?[facility] ?03/19/2021 ? ? ?

## 2021-03-28 ENCOUNTER — Telehealth: Payer: Self-pay | Admitting: Gastroenterology

## 2021-03-28 NOTE — Telephone Encounter (Signed)
Received and reviewed labs from PCP with results dated 02/28/2021. ? ?CMP with no significant abnormalities.  LFTs within normal limits. ?CBC entirely normal. ? ?[place]:  ?Please let patient know I reviewed recent labs completed in February with PCP. Her liver enzymes remain within normal limits.  ? ?Continue with recommendations provided at her OV.  ?

## 2021-03-29 NOTE — Telephone Encounter (Signed)
LMOM for pt to call office back 

## 2021-03-30 NOTE — Telephone Encounter (Signed)
Spoke to pt, informed her of results and recommendations. Pt voiced understanding.  ?

## 2021-04-01 ENCOUNTER — Other Ambulatory Visit (HOSPITAL_COMMUNITY): Payer: Self-pay

## 2021-04-01 DIAGNOSIS — C49A4 Gastrointestinal stromal tumor of large intestine: Secondary | ICD-10-CM

## 2021-04-01 DIAGNOSIS — D0511 Intraductal carcinoma in situ of right breast: Secondary | ICD-10-CM

## 2021-04-04 IMAGING — MR MR ABDOMEN WO/W CM MRCP
20 of 22 series · 42 of 48 positions shown · IV contrast (gadavist)
Comparison: Multiple exams, including ultrasound and CT
examinations from 03/10/2020

CLINICAL DATA: Right upper quadrant abdominal pain.

EXAM:
MRI ABDOMEN WITHOUT AND WITH CONTRAST (INCLUDING MRCP)
TECHNIQUE: Multiplanar multisequence MR imaging of the abdomen was performed
both before and after the administration of intravenous contrast.
Heavily T2-weighted images of the biliary and pancreatic ducts were
obtained, and three-dimensional MRCP images were rendered by post
processing.
CONTRAST:  7mL GADAVIST GADOBUTROL 1 MMOL/ML IV SOLN

[Series 3: cor haste · coronal · 6.0mm · 1.25mm/px · 1 of 26 slices shown]
[im 1/26]
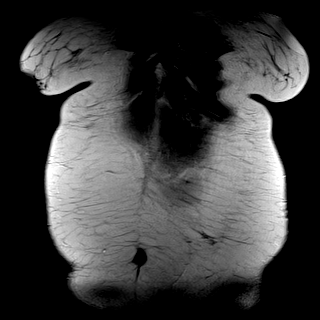

[Series 6: T2 fat-sat · axial · 6.0mm · 1.19mm/px · 1 of 30 slices shown]
[im 1/30]
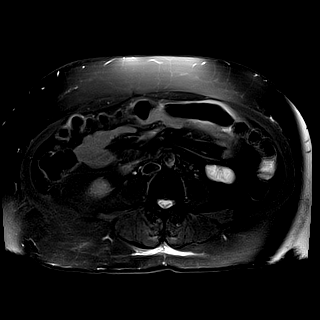

[Series 7: ax haste bh · axial · 6.0mm · 1.19mm/px · 1 of 30 slices shown]
[im 1/30]
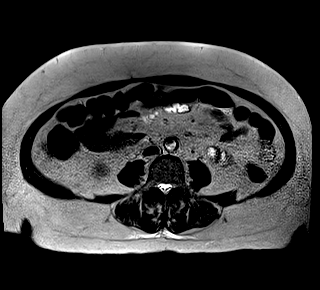

[Series 8: DWI · axial · 6.0mm · 1.42mm/px · 1 of 30 slices shown (1 of 4)]
[im 1/30]
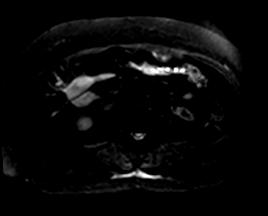

[Series 8: DWI · axial · 6.0mm · 1.42mm/px · 1 of 30 slices shown (2 of 4)]
[im 1/30]
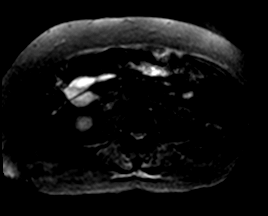

[Series 8: DWI · axial · 6.0mm · 1.42mm/px · 1 of 30 slices shown (3 of 4)]
[im 1/30]
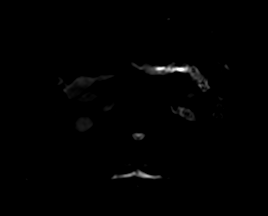

[Series 9: DWI · axial · 6.0mm · 1.42mm/px · 1 of 30 slices shown (4 of 4)]
[im 1/30]
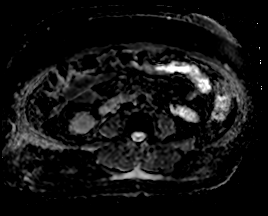

[Series 12: MRCP · coronal · 50.0mm · 0.78mm/px · 1 of 5 slices shown (1 of 2)]
[im 1/5]
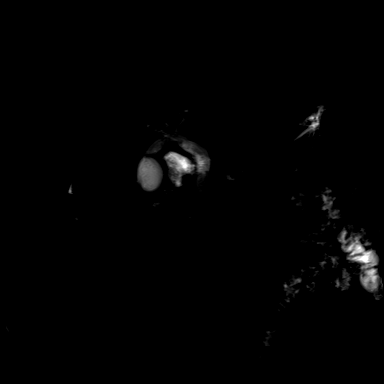

[Series 13: ax in and · axial · 3.5mm · 1.25mm/px · z∈[-82,+125]mm · 3 of 60 slices shown (1 of 2)]
[im 1/60]
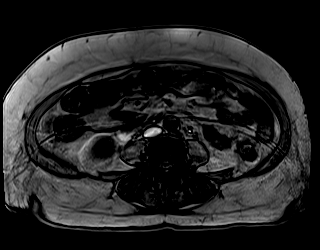
[im 30/60]
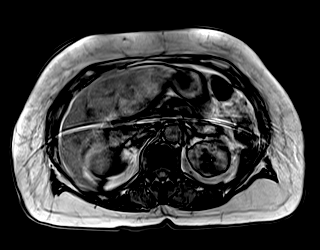
[im 60/60]
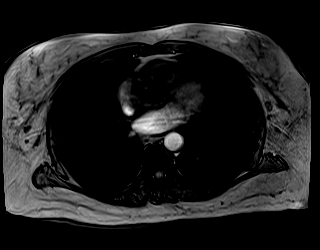

[Series 14: ax in and · axial · 3.5mm · 1.25mm/px · z∈[-82,+125]mm · 3 of 60 slices shown (2 of 2)]
[im 1/60]
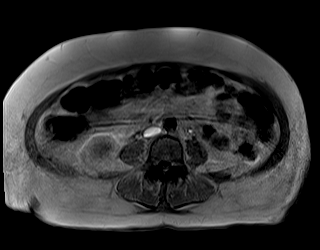
[im 30/60]
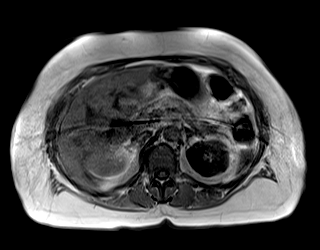
[im 60/60]
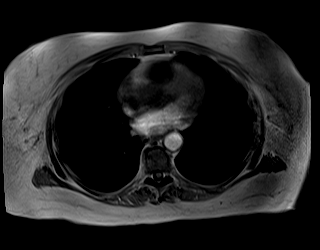

[Series 15: [id number] · coronal · 1.2mm · 0.49mm/px · 3 of 64 slices shown]
[im 1/64]
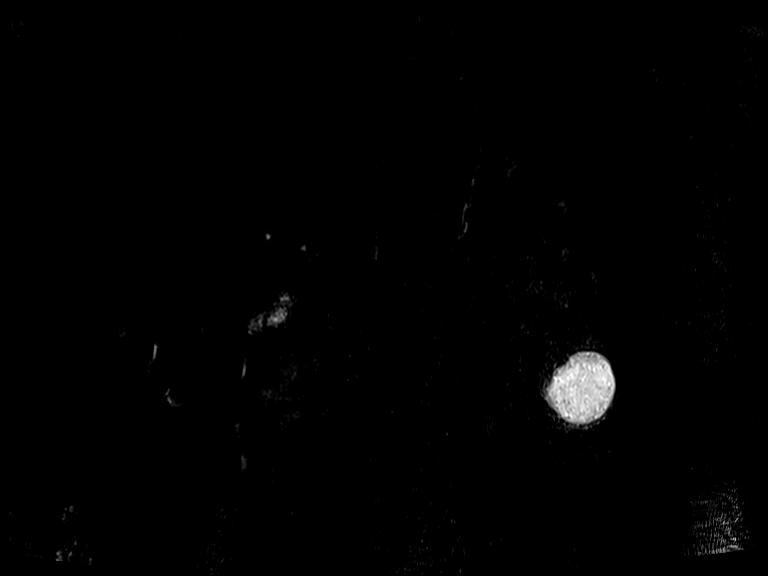
[im 32/64]
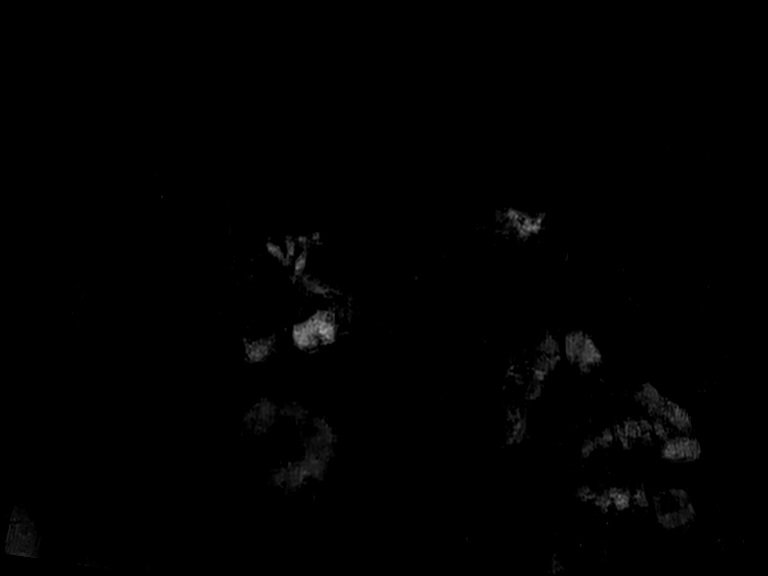
[im 64/64]
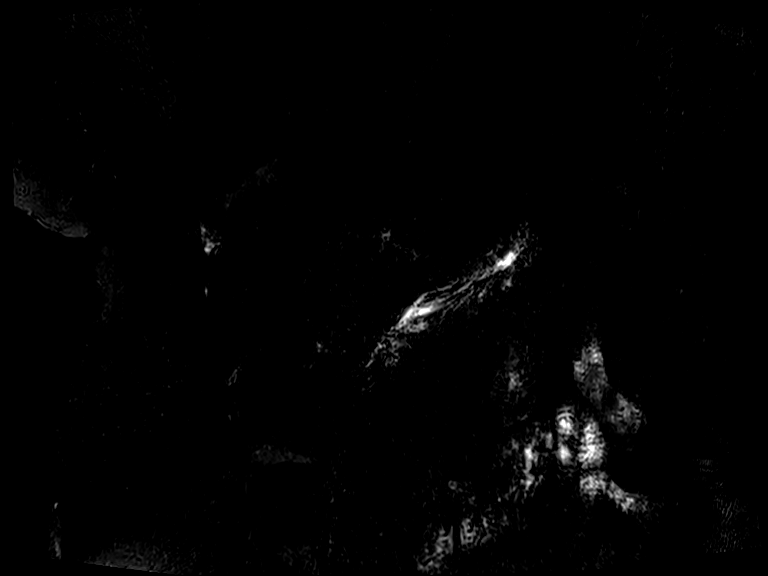

[Series 17: MRCP · coronal · 4.0mm · 1.12mm/px · 1 of 15 slices shown (2 of 2)]
[im 1/15]
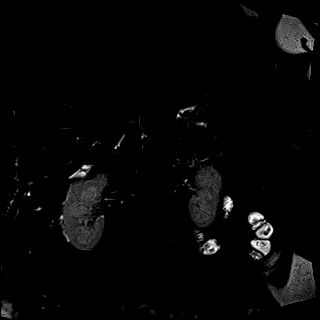

[Series 20: T1 dynamic · axial · 3.0mm · 1.33mm/px · z∈[-85,+128]mm · 3 of 72 slices shown (1 of 6)]
[im 1/72]
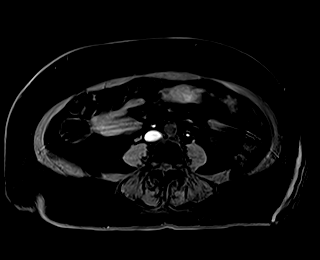
[im 36/72]
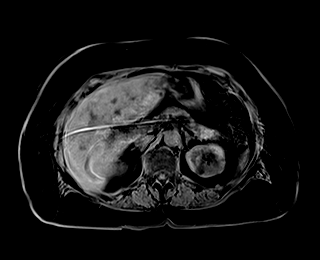
[im 72/72]
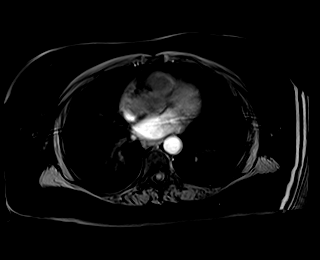

[Series 21: T1 dynamic · axial · 3.0mm · 1.33mm/px · z∈[-85,+128]mm · 3 of 72 slices shown (2 of 6)]
[im 1/72]
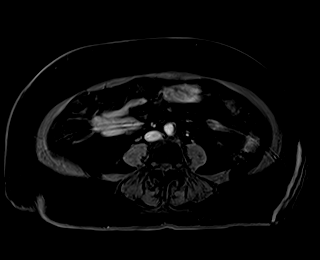
[im 36/72]
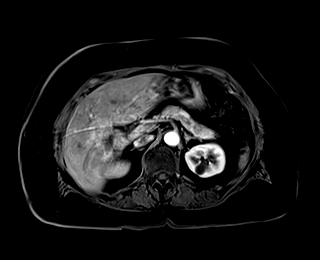
[im 72/72]
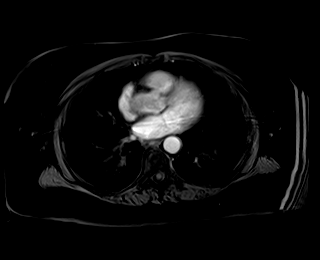

[Series 22: T1 dynamic · axial · 3.0mm · 1.33mm/px · z∈[-85,+128]mm · 3 of 72 slices shown (3 of 6)]
[im 1/72]
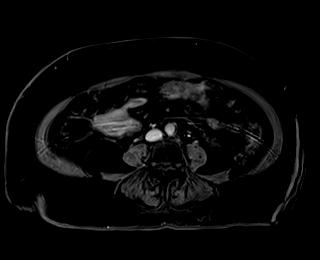
[im 36/72]
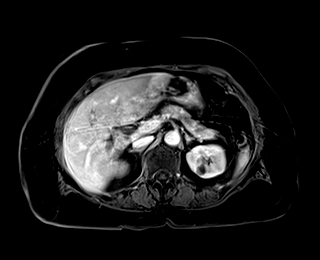
[im 72/72]
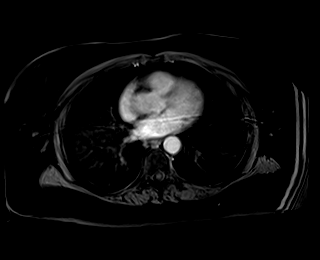

[Series 23: T1 dynamic · axial · 3.0mm · 1.33mm/px · z∈[-85,+128]mm · 3 of 72 slices shown (4 of 6)]
[im 1/72]
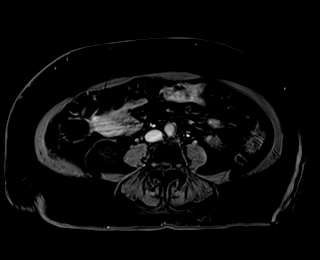
[im 36/72]
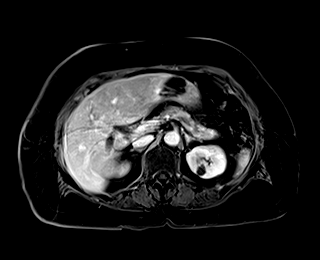
[im 72/72]
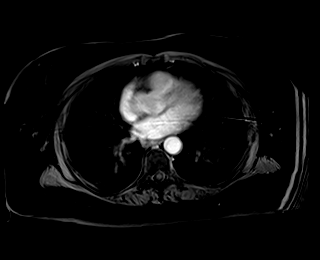

[Series 24: T1 dynamic post-contrast · coronal · 3.0mm · 1.31mm/px · 3 of 72 slices shown (1 of 2)]
[im 1/72]
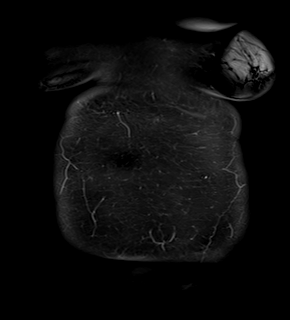
[im 36/72]
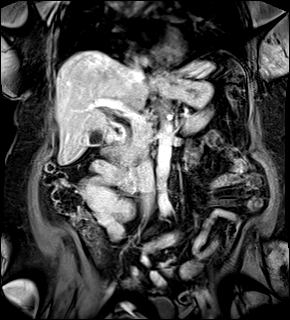
[im 72/72]
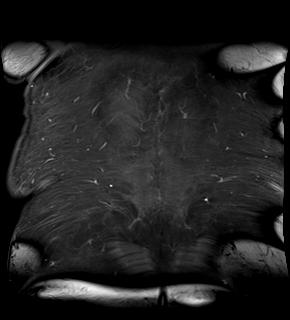

[Series 25: T1 dynamic post-contrast · axial · 3.0mm · 1.33mm/px · z∈[-85,+128]mm · 3 of 72 slices shown (2 of 2)]
[im 1/72]
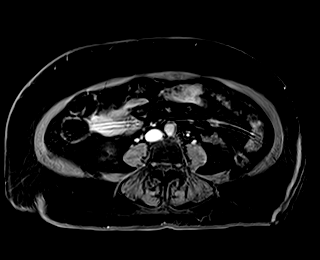
[im 36/72]
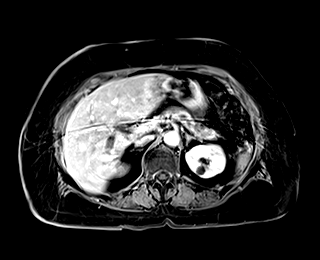
[im 72/72]
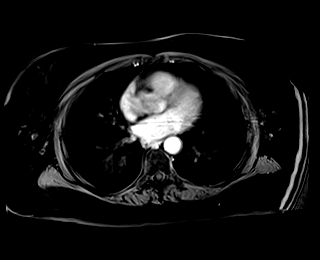

[Series 1007: T1 dynamic · axial · 3.0mm · 1.33mm/px · z∈[-85,+128]mm · 3 of 72 slices shown (5 of 6)]
[im 1/72]
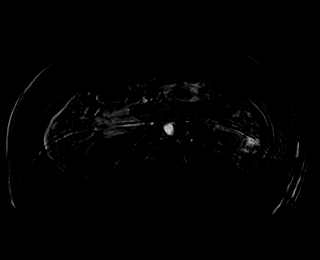
[im 36/72]
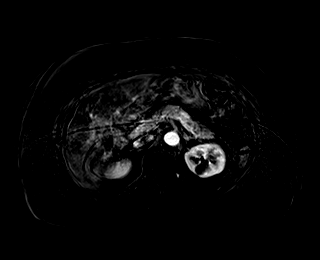
[im 72/72]
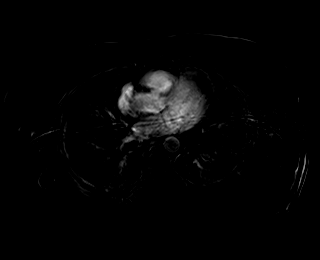

[Series 1009: T1 dynamic · axial · 3.0mm · 1.33mm/px · z∈[-85,+128]mm · 3 of 72 slices shown (6 of 6)]
[im 1/72]
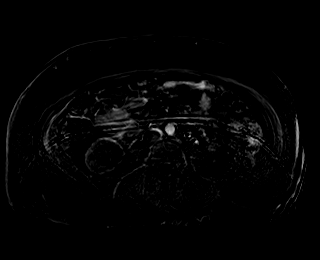
[im 36/72]
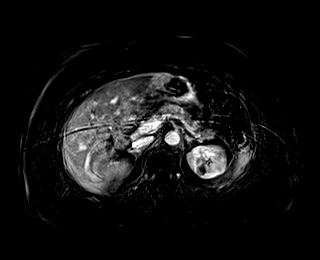
[im 72/72]
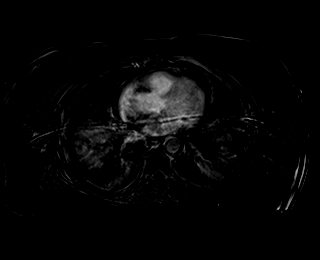

[42 of 48 positions shown; findings below may reference images not displayed]

FINDINGS: Lower chest: Unremarkable

Hepatobiliary: No significant abnormal enhancing liver lesion.
Somewhat low position of the cystic duct attachment to the common
hepatic duct. The common hepatic duct is up to 1.1 cm in diameter,
with the common bile duct up to 0.9 cm in diameter tapering distally
in a conical fashion without obvious filling defect. No abnormal
wall enhancement in the biliary tree. There is only borderline
prominence of the intrahepatic biliary tree.

The region of adenomyosis along the gallbladder is poorly seen on
today's MRI.

Pancreas:  Unremarkable

Spleen:  Unremarkable

Adrenals/Urinary Tract: Benign-appearing left renal cysts. Adrenal
glands unremarkable.

Stomach/Bowel: Descending and sigmoid colon diverticulosis.

Vascular/Lymphatic:  Aortoiliac atherosclerotic vascular disease.

Other:  No supplemental non-categorized findings.

Musculoskeletal: Lumbar spondylosis and degenerative disc disease.
IMPRESSION: 1. There is mild extrahepatic biliary dilatation with borderline
prominence of the intrahepatic biliary tree. No filling defect in
the common bile duct to suggest choledocholithiasis.
2. The region of adenomyosis along the gallbladder is poorly seen on
today's MRI.
3. Lumbar spondylosis and degenerative disc disease.
4. Descending and sigmoid colon diverticulosis.
5. Benign-appearing left renal cysts.

## 2021-04-05 ENCOUNTER — Other Ambulatory Visit: Payer: Self-pay

## 2021-04-05 ENCOUNTER — Ambulatory Visit (HOSPITAL_COMMUNITY)
Admission: RE | Admit: 2021-04-05 | Discharge: 2021-04-05 | Disposition: A | Discharge status: Home / Self Care | Payer: Medicare Other | Source: Ambulatory Visit | Attending: Hematology | Admitting: Hematology

## 2021-04-05 ENCOUNTER — Inpatient Hospital Stay (HOSPITAL_COMMUNITY): Payer: Medicare Other | Attending: Hematology

## 2021-04-05 ENCOUNTER — Encounter (HOSPITAL_COMMUNITY): Payer: Self-pay | Admitting: Radiology

## 2021-04-05 DIAGNOSIS — Z90710 Acquired absence of both cervix and uterus: Secondary | ICD-10-CM | POA: Insufficient documentation

## 2021-04-05 DIAGNOSIS — C49A4 Gastrointestinal stromal tumor of large intestine: Secondary | ICD-10-CM

## 2021-04-05 DIAGNOSIS — N63 Unspecified lump in unspecified breast: Secondary | ICD-10-CM | POA: Insufficient documentation

## 2021-04-05 DIAGNOSIS — I2510 Atherosclerotic heart disease of native coronary artery without angina pectoris: Secondary | ICD-10-CM | POA: Diagnosis not present

## 2021-04-05 DIAGNOSIS — I700 Atherosclerosis of aorta: Secondary | ICD-10-CM | POA: Insufficient documentation

## 2021-04-05 DIAGNOSIS — N6091 Unspecified benign mammary dysplasia of right breast: Secondary | ICD-10-CM | POA: Diagnosis present

## 2021-04-05 DIAGNOSIS — Z8509 Personal history of malignant neoplasm of other digestive organs: Secondary | ICD-10-CM | POA: Insufficient documentation

## 2021-04-05 DIAGNOSIS — R918 Other nonspecific abnormal finding of lung field: Secondary | ICD-10-CM | POA: Diagnosis not present

## 2021-04-05 DIAGNOSIS — M85852 Other specified disorders of bone density and structure, left thigh: Secondary | ICD-10-CM | POA: Insufficient documentation

## 2021-04-05 DIAGNOSIS — D0511 Intraductal carcinoma in situ of right breast: Secondary | ICD-10-CM | POA: Insufficient documentation

## 2021-04-05 DIAGNOSIS — F17210 Nicotine dependence, cigarettes, uncomplicated: Secondary | ICD-10-CM | POA: Insufficient documentation

## 2021-04-05 LAB — COMPREHENSIVE METABOLIC PANEL
ALT: 13 U/L (ref 0–44)
AST: 15 U/L (ref 15–41)
Albumin: 4.4 g/dL (ref 3.5–5.0)
Alkaline Phosphatase: 69 U/L (ref 38–126)
Anion gap: 8 (ref 5–15)
BUN: 15 mg/dL (ref 8–23)
CO2: 27 mmol/L (ref 22–32)
Calcium: 9.5 mg/dL (ref 8.9–10.3)
Chloride: 103 mmol/L (ref 98–111)
Creatinine, Ser: 0.81 mg/dL (ref 0.44–1.00)
GFR, Estimated: >60 mL/min (ref >60)
Glucose, Bld: 110 mg/dL — ABNORMAL HIGH (ref 70–99)
Potassium: 3.9 mmol/L (ref 3.5–5.1)
Sodium: 138 mmol/L (ref 135–145)
Total Bilirubin: 0.8 mg/dL (ref 0.3–1.2)
Total Protein: 7.2 g/dL (ref 6.5–8.1)

## 2021-04-05 LAB — CBC WITH DIFFERENTIAL/PLATELET
Abs Immature Granulocytes: 0.04 10*3/uL (ref 0.00–0.07)
Basophils Absolute: 0.1 10*3/uL (ref 0.0–0.1)
Basophils Relative: 1 %
Eosinophils Absolute: 0.3 10*3/uL (ref 0.0–0.5)
Eosinophils Relative: 2 %
HCT: 43.1 % (ref 36.0–46.0)
Hemoglobin: 14.7 g/dL (ref 12.0–15.0)
Immature Granulocytes: 0 %
Lymphocytes Relative: 19 %
Lymphs Abs: 2.1 10*3/uL (ref 0.7–4.0)
MCH: 31.7 pg (ref 26.0–34.0)
MCHC: 34.1 g/dL (ref 30.0–36.0)
MCV: 92.9 fL (ref 80.0–100.0)
Monocytes Absolute: 0.7 10*3/uL (ref 0.1–1.0)
Monocytes Relative: 7 %
Neutro Abs: 7.7 10*3/uL (ref 1.7–7.7)
Neutrophils Relative %: 71 %
Platelets: 243 10*3/uL (ref 150–400)
RBC: 4.64 MIL/uL (ref 3.87–5.11)
RDW: 13.9 % (ref 11.5–15.5)
WBC: 10.9 10*3/uL — ABNORMAL HIGH (ref 4.0–10.5)
nRBC: 0 % (ref 0.0–0.2)

## 2021-04-05 LAB — LACTATE DEHYDROGENASE: LDH: 144 U/L (ref 98–192)

## 2021-04-05 LAB — VITAMIN D 25 HYDROXY (VIT D DEFICIENCY, FRACTURES): Vit D, 25-Hydroxy: 25.39 ng/mL — ABNORMAL LOW (ref 30–100)

## 2021-04-05 MED ORDER — IOHEXOL 300 MG/ML  SOLN
100.0000 mL | Freq: Once | INTRAMUSCULAR | Status: AC | PRN
  Administered 2021-04-05: 100 mL via INTRAVENOUS

## 2021-04-12 ENCOUNTER — Inpatient Hospital Stay (HOSPITAL_COMMUNITY): Payer: Medicare Other | Attending: Hematology | Admitting: Hematology

## 2021-04-12 ENCOUNTER — Encounter (HOSPITAL_COMMUNITY): Payer: Self-pay

## 2021-04-12 VITALS — BP 149/77 | HR 67 | Temp 98.5°F | Resp 18 | Ht 61.0 in | Wt 158.4 lb

## 2021-04-12 DIAGNOSIS — N6091 Unspecified benign mammary dysplasia of right breast: Secondary | ICD-10-CM | POA: Diagnosis not present

## 2021-04-12 DIAGNOSIS — D0511 Intraductal carcinoma in situ of right breast: Secondary | ICD-10-CM | POA: Diagnosis present

## 2021-04-12 DIAGNOSIS — Z79899 Other long term (current) drug therapy: Secondary | ICD-10-CM | POA: Diagnosis not present

## 2021-04-12 DIAGNOSIS — M8580 Other specified disorders of bone density and structure, unspecified site: Secondary | ICD-10-CM | POA: Insufficient documentation

## 2021-04-12 DIAGNOSIS — Z85028 Personal history of other malignant neoplasm of stomach: Secondary | ICD-10-CM | POA: Insufficient documentation

## 2021-04-12 DIAGNOSIS — Z79810 Long term (current) use of selective estrogen receptor modulators (SERMs): Secondary | ICD-10-CM | POA: Diagnosis not present

## 2021-04-12 DIAGNOSIS — C49A4 Gastrointestinal stromal tumor of large intestine: Secondary | ICD-10-CM | POA: Diagnosis not present

## 2021-04-12 MED ORDER — TAMOXIFEN CITRATE 20 MG PO TABS: 20.0000 mg | ORAL_TABLET | Freq: Every day | ORAL | 6 refills | Status: DC

## 2021-04-12 NOTE — Progress Notes (Signed)
? ?[facility] ?[address] ?[place], [place] [address] ? ? ?Patient Care Team: ?Leonie Douglas, MD as PCP - General (Family Medicine) ?Rourk, Cristopher Estimable, MD as Consulting Physician (Gastroenterology) ?Derek Jack, MD as Medical Oncologist (Medical Oncology) ? 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  ?05/09/2020 Genetic Testing  ? Negative genetic testing:  No pathogenic variants detected on the [facility] Multi-Cancer + RNA panel. 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 The report date is 05/09/2020 ? ?The Multi-Cancer + RNA Panel offered by [facility] includes sequencing and/or deletion/duplication analysis of the following 84 genes:  AIP*, ALK, APC*, ATM*, AXIN2*, BAP1*, BARD1*, BLM*, BMPR1A*, BRCA1*, BRCA2*, BRIP1*, CASR, CDC73*, CDH1*, CDK4, CDKN1B*, CDKN1C*, CDKN2A, CEBPA, CHEK2*, CTNNA1*, DICER1*, DIS3L2*, EGFR, EPCAM, FH*, FLCN*, GATA2*, GPC3, GREM1, HOXB13, HRAS, KIT, MAX*, MEN1*, MET, MITF, MLH1*, MSH2*, MSH3*, MSH6*, MUTYH*, NBN*, NF1*, NF2*, NTHL1*, PALB2*, PDGFRA, PHOX2B, PMS2*, POLD1*, POLE*, POT1*, PRKAR1A*, PTCH1*, PTEN*, RAD50*, RAD51C*, RAD51D*, RB1*, RECQL4, RET, RUNX1*, SDHA*, SDHAF2*, SDHB*, SDHC*, SDHD*, SMAD4*, SMARCA4*, SMARCB1*, SMARCE1*, STK11*, SUFU*, TERC, TERT, TMEM127*, Tp53*, TSC1*, TSC2*, VHL*, WRN*, and WT1.  RNA analysis is performed for * genes.   ?  ? ? ?CHIEF COMPLIANT: Follow-up of right breast DCIS ? ? ?INTERVAL HISTORY: [name] is a 76 y.o. female here today for follow up of her right breast DCIS. Her last visit was on 11/13/2020.  ? ?Today she reports feeling good, and her granddaughter is present via telephone. She has not yet started radiation. She reports she occasional takes vitamin D and calcium. Her granddaughter  reports she is having trouble swallowing.  ? ?REVIEW OF SYSTEMS:   ?Review of Systems  ?Constitutional:  Negative for appetite change and fatigue.  ?HENT:   Positive for trouble swallowing.   ?Respiratory:  Positive for shortness of breath.   ?All other systems reviewed and are negative. ? ?I have reviewed the past medical history, past surgical history, social history and family history with the patient and they are unchanged from previous note. ? ? ?ALLERGIES:   ?is allergic to somatropin, tilactase, tape, indomethacin, sulfa antibiotics, tolectin [tolmetin], penicillins, and wound dressing adhesive. ? ? ?MEDICATIONS:  ?Current Outpatient Medications  ?Medication Sig Dispense Refill  ? baclofen (LIORESAL) 10 MG tablet Take 10 mg by mouth 3 (three) times daily.    ? acetaminophen (TYLENOL) 500 MG tablet Take 500 mg by mouth every 6 (six) hours as needed for moderate pain.    ? ascorbic acid (VITAMIN C) 1000 MG tablet Take 1 tablet by mouth daily.    ? Calcium Carbonate-Vitamin D3 600-400 MG-UNIT TABS Take 1 tablet by mouth daily.    ? losartan (COZAAR) 25 MG tablet Take 25 mg by mouth daily.    ? pantoprazole (PROTONIX) 40 MG tablet Take 1 tablet (40 mg total) by mouth daily. 90 tablet 3  ? Probiotic Product ([place]) CAPS Take 1 capsule by mouth daily.    ? rosuvastatin (CRESTOR) 10 MG tablet Take 10 mg by mouth daily.    ? ?No current facility-administered medications for this visit.  ? ? ? ?PHYSICAL EXAMINATION: ?Performance status (ECOG): 1 - Symptomatic but completely ambulatory ? ?Vitals:  ? 04/12/21 1121  ?BP: (!) 149/77  ?Pulse: 67  ?Resp: 18  ?Temp: 98.5 ?F (36.9 ?C)  ?SpO2: 97%  ? ?Wt Readings  from Last 3 Encounters:  ?04/12/21 158 lb 6.4 oz (71.8 kg)  ?03/19/21 158 lb (71.7 kg)  ?11/04/20 163 lb (73.9 kg)  ? ?Physical Exam ?Vitals reviewed.  ?Constitutional:   ?   Appearance: Normal appearance.  ?Cardiovascular:  ?   Rate and Rhythm: Normal rate and regular rhythm.  ?   Pulses: Normal  pulses.  ?   Heart sounds: Normal heart sounds.  ?Pulmonary:  ?   Effort: Pulmonary effort is normal.  ?   Breath sounds: Normal breath sounds.  ?Neurological:  ?   General: No focal deficit present.  ?   Mental Status: She is alert and oriented to person, place, and time.  ?Psychiatric:     ?   Mood and Affect: Mood normal.     ?   Behavior: Behavior normal.  ? ? ?Breast Exam Chaperone: Thana Ates   ? ? ?LABORATORY DATA:  ?I have reviewed the data as listed ? ?  Latest Ref Rng & Units 04/05/2021  ? 10:16 AM 11/13/2020  ? 12:54 PM 10/07/2020  ? 11:38 AM  ?CMP  ?Glucose 70 - 99 mg/dL 110   88   97    ?BUN 8 - 23 mg/dL 15   12   12     ?Creatinine 0.44 - 1.00 mg/dL 0.81   0.59   0.69    ?Sodium 135 - 145 mmol/L 138   140   140    ?Potassium 3.5 - 5.1 mmol/L 3.9   4.1   3.7    ?Chloride 98 - 111 mmol/L 103   107   107    ?CO2 22 - 32 mmol/L 27   26   28     ?Calcium 8.9 - 10.3 mg/dL 9.5   9.6   9.0    ?Total Protein 6.5 - 8.1 g/dL 7.2   6.8     ?Total Bilirubin 0.3 - 1.2 mg/dL 0.8   0.9     ?Alkaline Phos 38 - 126 U/L 69   58     ?AST 15 - 41 U/L 15   13     ?ALT 0 - 44 U/L 13   14     ? ?No results found for: [id number] ?Lab Results  ?Component Value Date  ? WBC 10.9 (H) 04/05/2021  ? HGB 14.7 04/05/2021  ? HCT 43.1 04/05/2021  ? MCV 92.9 04/05/2021  ? PLT 243 04/05/2021  ? NEUTROABS 7.7 04/05/2021  ? ? ?ASSESSMENT:  ?Right breast DCIS: ?- Had initial biopsy on 09/06/2018 one of the right breast in [place] consistent with ADH. ?- On 11/06/2020, lumpectomy. ?- Pathology consistent with 1.5 cm DCIS, margins negative, intermediate grade, pTis P NX. ?  ?Social/family history: ?- She is retired [employer]ar[employer] manager.  Quit smoking 3 years ago, smoked 1 pack/day. ?- Mother had colon cancer, brother had lung cancer.  Maternal cousin had ovarian/cervical cancer.  Daughter had ovarian/cervical/vaginal cancer.  Maternal aunt had breast cancer. ? ?3.  Multifocal gastric GIST tumors: ?-CT CAP on 05/30/2019 at [facility] in [place] shows 4 x  2 x 2 cm mass in fundus with no metastatic disease.  6 mm solitary pulmonary nodule in the left upper lobe. ?-On 06/19/2019-partial gastrectomy x2, hiatal hernia repair, esophageal myotomy ?- Pathology shows 2 gastrointestinal stromal tumors, smaller lesion measuring 0.7 cm and larger lesion measuring 4.7 cm, mitotic rate less than 5/45m2 for both lesions, low-grade, very low risk, no lymph nodes found (PT 1 and PT 2 N0). ?-She was  evaluated by Dr. Nicoletta Dress of medical oncology at [facility] and was started on adjuvant imatinib 400 mg daily. ?- She appears to be in the very low risk range with a 1.9% metastatic rate.  This prognostic assessment applies best to KIT negative or PDGFR positive GIST's, whereas SDH deficient GIST's are more unpredictable.  Unsure why she was started on imatinib.  Was possible that she was part of clinical trial. ? ? ?PLAN:  ?Right breast DCIS ER/PR positive: ?- She was lost to follow-up after she was initially seen by me in November 2022. ?- She has met with Dr. Lynnette Caffey, but has not started radiation.  She reportedly had multiple problems after last visit. ?- We discussed pathology report which showed ER/PR was positive. ?- We reviewed CT CAP from 04/05/2021: New vague 7 mm groundglass nodule in the posterior segment of the right upper lobe.  Previously seen left lung nodule is stable.  Cystic structure along the ventral aspect of the vocal cords. ?- She reports difficulty swallowing.  We will make ENT referral for direct laryngoscopy. ?- We talked about initiating her on antiestrogen therapy.  Because of osteopenia, I have recommended tamoxifen for 5 years.  We talked about side effects in detail. ?- She was recommended to follow-up with Dr. Lynnette Caffey to initiate radiation. ?- We will see her every 6 months for 5 years.  Treatment plan also discussed with granddaughter over phone. ? ?2.  Multifocal gastric GIST tumors: ?- We have reviewed CT CAP from 04/05/2021: No evidence of  recurrence or metastatic disease. ?- Based on her very low risk disease and low percent metastatic rate, imatinib was discontinued in October 2022.  We will continue monitoring. ? ?3.  Osteopenia: ?- DEXA scan on 11/

## 2021-04-12 NOTE — Progress Notes (Signed)
Patient referred back to [facility] for breast radiation. ?

## 2021-04-12 NOTE — Patient Instructions (Signed)
[place] at [facility] ?Discharge Instructions ? ? ?You were seen and examined today by Dr. Delton Coombes. ? ?He reviewed the results of your CT scan.  The spots on the lungs are nothing to be concerned.  We will continue to monitor. We will make a referral to ENT doctor to evaluate the finding on your vocal cord. ? ?The cancer you have feeds off of estrogen.  We will send a pill called Tamoxifen that you will take daily for 5 years.  ? ?Follow up with Dr. Lynnette Caffey to initiate radiation. ? ?You should take Vitamin D 1000 units daily.  ? ?Return as scheduled.  ? ? ?Thank you for choosing [place] at [facility] to provide your oncology and hematology care.  To afford each patient quality time with our provider, please arrive at least 15 minutes before your scheduled appointment time.  ? ?If you have a lab appointment with the [facility] please come in thru the Main Entrance and check in at the main information desk. ? ?You need to re-schedule your appointment should you arrive 10 or more minutes late.  We strive to give you quality time with our providers, and arriving late affects you and other patients whose appointments are after yours.  Also, if you no show three or more times for appointments you may be dismissed from the clinic at the providers discretion.     ?Again, thank you for choosing [facility].  Our hope is that these requests will decrease the amount of time that you wait before being seen by our physicians.       ?_____________________________________________________________ ? ?Should you have questions after your visit to [facility], please contact our office at [phone] and follow the prompts.  Our office hours are 8:00 a.m. and 4:30 p.m. Monday - Friday.  Please note that voicemails left after 4:00 p.m. may not be returned until the following business day.  We are closed weekends and major holidays.  You do have  access to a nurse 24-7, just call the main number to the clinic [phone] and do not press any options, hold on the line and a nurse will answer the phone.   ? ?For prescription refill requests, have your pharmacy contact our office and allow 72 hours.   ? ?Due to Covid, you will need to wear a mask upon entering the hospital. If you do not have a mask, a mask will be given to you at the Main Entrance upon arrival. For doctor visits, patients may have 1 support person age 48 or older with them. For treatment visits, patients can not have anyone with them due to social distancing guidelines and our immunocompromised population.  ? ?   ?

## 2021-05-05 ENCOUNTER — Other Ambulatory Visit (HOSPITAL_COMMUNITY): Payer: Self-pay | Admitting: Hematology

## 2021-05-05 ENCOUNTER — Other Ambulatory Visit (HOSPITAL_COMMUNITY): Payer: Self-pay | Admitting: *Deleted

## 2021-05-05 DIAGNOSIS — N6091 Unspecified benign mammary dysplasia of right breast: Secondary | ICD-10-CM

## 2021-05-05 NOTE — Progress Notes (Signed)
Spoke with Radiation Oncology at Dr. Lynnette Caffey' office to clarify reason for requested mammogram.  Patient was to start radiation to right breast last year and decided against treatment at that time.  She is now ready for treatment and a new Diagnostic mammo of right breast needs to be performed prior to scheduling to assess for changes. ?

## 2021-05-11 ENCOUNTER — Ambulatory Visit (HOSPITAL_COMMUNITY): Payer: Medicare Other

## 2021-05-11 ENCOUNTER — Encounter (HOSPITAL_COMMUNITY): Payer: Medicare Other

## 2021-06-01 ENCOUNTER — Ambulatory Visit (HOSPITAL_COMMUNITY)
Admission: RE | Admit: 2021-06-01 | Discharge: 2021-06-01 | Disposition: A | Discharge status: Home / Self Care | Payer: Medicare Other | Source: Ambulatory Visit | Attending: Hematology | Admitting: Hematology

## 2021-06-01 ENCOUNTER — Encounter (HOSPITAL_COMMUNITY): Payer: Self-pay

## 2021-06-01 DIAGNOSIS — N6091 Unspecified benign mammary dysplasia of right breast: Secondary | ICD-10-CM

## 2021-06-01 DIAGNOSIS — D0511 Intraductal carcinoma in situ of right breast: Secondary | ICD-10-CM | POA: Diagnosis not present

## 2021-06-01 DIAGNOSIS — Z1231 Encounter for screening mammogram for malignant neoplasm of breast: Secondary | ICD-10-CM | POA: Diagnosis not present

## 2021-08-05 DIAGNOSIS — Z720 Tobacco use: Secondary | ICD-10-CM | POA: Insufficient documentation

## 2021-08-05 DIAGNOSIS — K219 Gastro-esophageal reflux disease without esophagitis: Secondary | ICD-10-CM

## 2021-08-05 HISTORY — DX: Gastro-esophageal reflux disease without esophagitis: K21.9

## 2021-08-05 HISTORY — DX: Tobacco use: Z72.0

## 2021-10-14 ENCOUNTER — Inpatient Hospital Stay: Payer: Medicare Other

## 2021-10-21 ENCOUNTER — Ambulatory Visit: Payer: Medicare Other | Admitting: Hematology

## 2021-10-27 ENCOUNTER — Other Ambulatory Visit: Payer: Self-pay

## 2021-10-28 ENCOUNTER — Inpatient Hospital Stay: Payer: Medicare Other | Attending: Hematology

## 2021-10-28 DIAGNOSIS — M8580 Other specified disorders of bone density and structure, unspecified site: Secondary | ICD-10-CM | POA: Diagnosis not present

## 2021-10-28 DIAGNOSIS — D0511 Intraductal carcinoma in situ of right breast: Secondary | ICD-10-CM | POA: Diagnosis present

## 2021-10-28 DIAGNOSIS — C49A4 Gastrointestinal stromal tumor of large intestine: Secondary | ICD-10-CM

## 2021-10-28 DIAGNOSIS — Z79810 Long term (current) use of selective estrogen receptor modulators (SERMs): Secondary | ICD-10-CM | POA: Insufficient documentation

## 2021-10-28 DIAGNOSIS — Z8509 Personal history of malignant neoplasm of other digestive organs: Secondary | ICD-10-CM | POA: Diagnosis not present

## 2021-10-28 LAB — CBC WITH DIFFERENTIAL/PLATELET
Abs Immature Granulocytes: 0.02 10*3/uL (ref 0.00–0.07)
Basophils Absolute: 0 10*3/uL (ref 0.0–0.1)
Basophils Relative: 0 %
Eosinophils Absolute: 0.2 10*3/uL (ref 0.0–0.5)
Eosinophils Relative: 2 %
HCT: 38.2 % (ref 36.0–46.0)
Hemoglobin: 12.8 g/dL (ref 12.0–15.0)
Immature Granulocytes: 0 %
Lymphocytes Relative: 16 %
Lymphs Abs: 1.4 10*3/uL (ref 0.7–4.0)
MCH: 30.8 pg (ref 26.0–34.0)
MCHC: 33.5 g/dL (ref 30.0–36.0)
MCV: 92 fL (ref 80.0–100.0)
Monocytes Absolute: 0.7 10*3/uL (ref 0.1–1.0)
Monocytes Relative: 8 %
Neutro Abs: 6.5 10*3/uL (ref 1.7–7.7)
Neutrophils Relative %: 74 %
Platelets: 239 10*3/uL (ref 150–400)
RBC: 4.15 MIL/uL (ref 3.87–5.11)
RDW: 13.5 % (ref 11.5–15.5)
WBC: 8.8 10*3/uL (ref 4.0–10.5)
nRBC: 0 % (ref 0.0–0.2)

## 2021-10-28 LAB — COMPREHENSIVE METABOLIC PANEL
ALT: 17 U/L (ref 0–44)
AST: 15 U/L (ref 15–41)
Albumin: 3.9 g/dL (ref 3.5–5.0)
Alkaline Phosphatase: 47 U/L (ref 38–126)
Anion gap: 7 (ref 5–15)
BUN: 10 mg/dL (ref 8–23)
CO2: 28 mmol/L (ref 22–32)
Calcium: 9.1 mg/dL (ref 8.9–10.3)
Chloride: 106 mmol/L (ref 98–111)
Creatinine, Ser: 0.65 mg/dL (ref 0.44–1.00)
GFR, Estimated: >60 mL/min (ref >60)
Glucose, Bld: 106 mg/dL — ABNORMAL HIGH (ref 70–99)
Potassium: 3.8 mmol/L (ref 3.5–5.1)
Sodium: 141 mmol/L (ref 135–145)
Total Bilirubin: 1.1 mg/dL (ref 0.3–1.2)
Total Protein: 6.6 g/dL (ref 6.5–8.1)

## 2021-10-28 LAB — VITAMIN D 25 HYDROXY (VIT D DEFICIENCY, FRACTURES): Vit D, 25-Hydroxy: 33.56 ng/mL (ref 30–100)

## 2021-10-28 LAB — LACTATE DEHYDROGENASE: LDH: 142 U/L (ref 98–192)

## 2021-11-09 ENCOUNTER — Inpatient Hospital Stay: Payer: Medicare Other | Admitting: Hematology

## 2021-11-22 ENCOUNTER — Other Ambulatory Visit (HOSPITAL_COMMUNITY): Payer: Self-pay | Admitting: Hematology

## 2021-11-22 ENCOUNTER — Inpatient Hospital Stay: Payer: Medicare Other | Attending: Hematology | Admitting: Hematology

## 2021-11-22 VITALS — BP 114/77 | HR 74 | Temp 97.7°F | Resp 16 | Wt 146.2 lb

## 2021-11-22 DIAGNOSIS — D0511 Intraductal carcinoma in situ of right breast: Secondary | ICD-10-CM

## 2021-11-22 DIAGNOSIS — Z79810 Long term (current) use of selective estrogen receptor modulators (SERMs): Secondary | ICD-10-CM | POA: Insufficient documentation

## 2021-11-22 DIAGNOSIS — Z9889 Other specified postprocedural states: Secondary | ICD-10-CM

## 2021-11-22 DIAGNOSIS — C49A4 Gastrointestinal stromal tumor of large intestine: Secondary | ICD-10-CM

## 2021-11-22 DIAGNOSIS — Z79899 Other long term (current) drug therapy: Secondary | ICD-10-CM | POA: Diagnosis not present

## 2021-11-22 NOTE — Progress Notes (Signed)
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 The report date is 05/09/2020  The Multi-Cancer + RNA Panel offered by [facility] includes sequencing and/or deletion/duplication analysis of the following 84 genes:  AIP*, ALK, APC*, ATM*, AXIN2*, BAP1*, BARD1*, BLM*, BMPR1A*, BRCA1*, BRCA2*, BRIP1*, CASR, CDC73*, CDH1*, CDK4, CDKN1B*, CDKN1C*, CDKN2A, CEBPA, CHEK2*, CTNNA1*, DICER1*, DIS3L2*, EGFR, EPCAM, FH*, FLCN*, GATA2*, GPC3, GREM1, HOXB13, HRAS, KIT, MAX*, MEN1*, MET, MITF, MLH1*, MSH2*, MSH3*, MSH6*, MUTYH*, NBN*, NF1*, NF2*, NTHL1*, PALB2*, PDGFRA, PHOX2B, PMS2*, POLD1*, POLE*, POT1*, PRKAR1A*, PTCH1*, PTEN*, RAD50*, RAD51C*, RAD51D*, RB1*, RECQL4, RET, RUNX1*, SDHA*, SDHAF2*, SDHB*, SDHC*, SDHD*, SMAD4*, SMARCA4*, SMARCB1*, SMARCE1*, STK11*, SUFU*, TERC, TERT, TMEM127*, Tp53*, TSC1*, TSC2*, VHL*, WRN*, and WT1.  RNA analysis is performed for * genes.       CHIEF COMPLIANT: Follow-up of right breast DCIS   INTERVAL HISTORY: [name] is a 76 y.o. female seen for follow-up of right breast DCIS.  She did not have radiation therapy done.  She is tolerating tamoxifen very well.  Reports energy levels are 50%.  REVIEW OF SYSTEMS:   Review of Systems   Constitutional:  Negative for appetite change and fatigue.  Neurological:  Positive for dizziness, headaches and numbness (hands not constant).  All other systems reviewed and are negative.   I have reviewed the past medical history, past surgical history, social history and family history with the patient and they are unchanged from previous note.   ALLERGIES:   is allergic to penicillins, somatropin, tilactase, tape, indomethacin, tolectin [tolmetin], sulfa antibiotics, and wound dressing adhesive.   MEDICATIONS:  Current Outpatient Medications  Medication Sig Dispense Refill   acetaminophen (TYLENOL) 500 MG tablet Take 500 mg by mouth every 6 (six) hours as needed for moderate pain.     ascorbic acid (VITAMIN C) 1000 MG tablet Take 1 tablet by mouth daily.     baclofen (LIORESAL) 10 MG tablet Take 10 mg by mouth 3 (three) times daily.     Calcium Carbonate-Vitamin D3 600-400 MG-UNIT TABS Take 1 tablet by mouth daily.     Cholecalciferol 50 MCG (2000 UT) TABS Take by mouth.     imatinib (GLEEVEC) 400 MG tablet 1 tablet with a meal and a large glass of water Orally Once a day     losartan (COZAAR) 25 MG tablet Take 25 mg by mouth daily.     pantoprazole (PROTONIX) 40 MG tablet Take 1 tablet (40 mg total) by mouth daily. 90 tablet 3   Probiotic Product ([place]) CAPS Take 1 capsule by mouth daily.     rosuvastatin (CRESTOR) 10 MG tablet Take 10 mg by mouth daily.     tamoxifen (NOLVADEX) 20 MG tablet Take 1 tablet (20 mg total) by mouth daily. 30 tablet 6   No current facility-administered medications for this visit.     PHYSICAL EXAMINATION: Performance status (ECOG):  1 - Symptomatic but completely ambulatory  Vitals:   11/22/21 1112  BP: 114/77  Pulse: 74  Resp: 16  Temp: 97.7 F (36.5 C)  SpO2: 93%   Wt Readings from Last 3 Encounters:  11/22/21 146 lb 3.2 oz (66.3 kg)  04/12/21 158 lb 6.4 oz (71.8 kg)  03/19/21 158 lb (71.7 kg)   Physical  Exam Vitals reviewed.  Constitutional:      Appearance: Normal appearance.  Cardiovascular:     Rate and Rhythm: Normal rate and regular rhythm.     Pulses: Normal pulses.     Heart sounds: Normal heart sounds.  Pulmonary:     Effort: Pulmonary effort is normal.     Breath sounds: Normal breath sounds.  Neurological:     General: No focal deficit present.     Mental Status: She is alert and oriented to person, place, and time.  Psychiatric:        Mood and Affect: Mood normal.        Behavior: Behavior normal.     Breast Exam Chaperone: Thana Ates     LABORATORY DATA:  I have reviewed the data as listed    Latest Ref Rng & Units 10/28/2021    1:14 PM 04/05/2021   10:16 AM 11/13/2020   12:54 PM  CMP  Glucose 70 - 99 mg/dL 106  110  88   BUN 8 - 23 mg/dL _0 Creatinine 0.44 - 1.00 mg/dL 0.65  0.81  0.59   Sodium 135 - 145 mmol/L 141  138  140   Potassium 3.5 - 5.1 mmol/L 3.8  3.9  4.1   Chloride 98 - 111 mmol/L 106  103  107   CO2 22 - 32 mmol/L _1 Calcium 8.9 - 10.3 mg/dL 9.1  9.5  9.6   Total Protein 6.5 - 8.1 g/dL 6.6  7.2  6.8   Total Bilirubin 0.3 - 1.2 mg/dL 1.1  0.8  0.9   Alkaline Phos 38 - 126 U/L 47  69  58   AST 15 - 41 U/L _2 ALT 0 - 44 U/L _3 No results found for: "CAN153" Lab Results  Component Value Date   WBC 8.8 10/28/2021   HGB 12.8 10/28/2021   HCT 38.2 10/28/2021   MCV 92.0 10/28/2021   PLT 239 10/28/2021   NEUTROABS 6.5 10/28/2021    ASSESSMENT:  Right breast DCIS: - Had initial biopsy on 09/06/2018 one of the right breast in [place] consistent with ADH. - On 11/06/2020, lumpectomy. - Pathology consistent with 1.5 cm DCIS, margins negative, intermediate grade, pTis P NX.  She did not receive radiation therapy.  Tamoxifen started on 04/12/2021.   Social/family history: - She is retired [employer]ar[employer] manager.  Quit smoking 3 years ago, smoked 1 pack/day. - Mother had colon cancer, brother had lung cancer.   Maternal cousin had ovarian/cervical cancer.  Daughter had ovarian/cervical/vaginal cancer.  Maternal aunt had breast cancer.  3.  Multifocal gastric GIST tumors: -CT CAP on 05/30/2019 at [facility] in [place] shows 4 x 2 x 2 cm mass in fundus with no metastatic disease.  6 mm solitary pulmonary nodule in the left upper lobe. -On 06/19/2019-partial gastrectomy x2, hiatal hernia repair, esophageal myotomy - Pathology shows 2 gastrointestinal stromal tumors, smaller lesion measuring 0.7 cm and larger lesion measuring 4.7 cm, mitotic rate less than  5/19m2 for both lesions, low-grade, very low risk, no lymph nodes found (PT 1 and PT 2 N0). -[facility] imatinib 400 mg daily. - She appears to be in the very low risk range with a 1.9% metastatic rate.  This prognostic assessment applies best to KIT negative or PDGFR positive GIST's, whereas SDH deficient GIST's are more unpredictable.  Unsure why she was started on imatinib.  Was possible that she was part of clinical trial.   PLAN:  Right breast DCIS ER/PR positive: -She has not received radiation therapy. - She was started on tamoxifen in April 2023.  She is tolerating it very well. - She was evaluated by ENT and had a normal exam of the larynx.  Previous CT on 04/05/2021 showed cystic structure along the ventral aspect of the vocal cords. - Reviewed labs from 10/28/2021 which showed normal LFTs and CBC. - Mammogram on 06/01/2021 was BI-RADS Category 2 of the right breast. - She has missed her mammogram in August.  We will schedule bilateral diagnostic mammogram. - Continue tamoxifen.  RTC 6 months for follow-up.  2.  Multifocal gastric GIST tumors: - CT CAP (04/05/2021): No evidence of recurrence or metastatic disease. - Based on very low risk disease and low percent metastatic rate, imatinib discontinued in October 2022.  We will continue monitoring.     3.  Osteopenia (DEXA scan on 11/27/2020 T score -1.9): - Vitamin D level is 33. - Continue vitamin D 1000 units daily.  Breast Cancer therapy associated bone loss: I have recommended calcium, Vitamin D and weight bearing exercises.  Orders placed this encounter:  No orders of the defined types were placed in this encounter.   The patient has a good understanding of the overall plan. She agrees with it. She will call with any problems that may develop before the next visit here.  SDerek Jack MD [phone]

## 2021-11-22 NOTE — Patient Instructions (Signed)
[facility]  Discharge Instructions  You were seen and examined today by Dr. Delton Coombes.  Please have a repeat mammogram. Dr. Delton Coombes has recommended follow-up in 6 months.  Follow-up as scheduled.  Thank you for choosing [facility] to provide your oncology and hematology care.   To afford each patient quality time with our provider, please arrive at least 15 minutes before your scheduled appointment time. You may need to reschedule your appointment if you arrive late (10 or more minutes). Arriving late affects you and other patients whose appointments are after yours.  Also, if you miss three or more appointments without notifying the office, you may be dismissed from the clinic at the provider's discretion.    Again, thank you for choosing [facility].  Our hope is that these requests will decrease the amount of time that you wait before being seen by our physicians.   If you have a lab appointment with the [facility] please come in thru the Main Entrance and check in at the main information desk.           _____________________________________________________________  Should you have questions after your visit to [facility], please contact our office at [phone] and follow the prompts.  Our office hours are 8:00 a.m. to 4:30 p.m. Monday - Thursday and 8:00 a.m. to 2:30 p.m. Friday.  Please note that voicemails left after 4:00 p.m. may not be returned until the following business day.  We are closed weekends and all major holidays.  You do have access to a nurse 24-7, just call the main number to the clinic [phone] and do not press any options, hold on the line and a nurse will answer the phone.    For prescription refill requests, have your pharmacy contact our office and allow 72 hours.    Masks are optional in the cancer centers. If you would like for your care team to wear a mask while  they are taking care of you, please let them know. You may have one support person who is at least 75 years old accompany you for your appointments.

## 2021-12-08 ENCOUNTER — Encounter (HOSPITAL_COMMUNITY): Payer: Medicare Other

## 2021-12-08 ENCOUNTER — Ambulatory Visit (HOSPITAL_COMMUNITY): Payer: Medicare Other

## 2021-12-08 ENCOUNTER — Ambulatory Visit (HOSPITAL_COMMUNITY): Admission: RE | Admit: 2021-12-08 | Payer: Medicare Other | Source: Ambulatory Visit

## 2021-12-08 ENCOUNTER — Encounter (HOSPITAL_COMMUNITY): Payer: Self-pay

## 2021-12-21 ENCOUNTER — Other Ambulatory Visit: Payer: Self-pay | Admitting: *Deleted

## 2021-12-21 ENCOUNTER — Other Ambulatory Visit (HOSPITAL_COMMUNITY): Payer: Self-pay | Admitting: Hematology

## 2021-12-21 NOTE — Telephone Encounter (Signed)
Tamoxifen refill approved.  Patient tolerating and is to continue therapy.

## 2021-12-29 ENCOUNTER — Ambulatory Visit (HOSPITAL_COMMUNITY)
Admission: RE | Admit: 2021-12-29 | Discharge: 2021-12-29 | Disposition: A | Discharge status: Home / Self Care | Payer: Medicare Other | Source: Ambulatory Visit | Attending: Hematology | Admitting: Hematology

## 2021-12-29 DIAGNOSIS — D0511 Intraductal carcinoma in situ of right breast: Secondary | ICD-10-CM | POA: Diagnosis present

## 2021-12-29 DIAGNOSIS — Z9889 Other specified postprocedural states: Secondary | ICD-10-CM

## 2021-12-29 DIAGNOSIS — C49A4 Gastrointestinal stromal tumor of large intestine: Secondary | ICD-10-CM | POA: Diagnosis present

## 2022-01-13 ENCOUNTER — Other Ambulatory Visit (HOSPITAL_COMMUNITY): Payer: Self-pay | Admitting: Hematology

## 2022-02-07 ENCOUNTER — Other Ambulatory Visit: Payer: Self-pay | Admitting: *Deleted

## 2022-02-07 MED ORDER — TAMOXIFEN CITRATE 20 MG PO TABS: 20.0000 mg | ORAL_TABLET | Freq: Every day | ORAL | 0 refills | Status: DC

## 2022-02-07 NOTE — Telephone Encounter (Signed)
Tamoxifen sent to [facility] by [facility] per pt request.  Patient is tolerating and is to continue therapy.

## 2022-02-10 ENCOUNTER — Ambulatory Visit
Admission: EM | Admit: 2022-02-10 | Discharge: 2022-02-10 | Disposition: A | Discharge status: Home / Self Care | Payer: 59 | Attending: Nurse Practitioner | Admitting: Nurse Practitioner

## 2022-02-10 ENCOUNTER — Encounter: Payer: Self-pay | Admitting: Emergency Medicine

## 2022-02-10 DIAGNOSIS — B029 Zoster without complications: Secondary | ICD-10-CM

## 2022-02-10 MED ORDER — VALACYCLOVIR HCL 1 G PO TABS: 1000.0000 mg | ORAL_TABLET | Freq: Three times a day (TID) | ORAL | 0 refills | Status: AC

## 2022-02-10 NOTE — ED Provider Notes (Signed)
[facility]    CSN: [id number] Arrival date & time: 02/10/22  1317      History   Chief Complaint No chief complaint on file.   HPI Cassandra Fowler is a 77 y.o. female.   Patient presents today for 1 day history of rash to the right flank area/rib cage area that she noticed last night.  Reports the rash is little bit red.  It does not itch or burn.  It is not painful.  No oozing, scaling, blisters.  No fevers or nausea/vomiting.  No recent change in detergents, soaps, personal care products.  Patient denies history of similar rash.  Reports her daughter has had shingles before and told her it looked like shingles.  Has not taken anything for the rash so far.  She is a retired [employer].    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   Sclerosing adenosis of right breast    Achalasia 05/13/2020   Fatty liver 05/13/2020   Abnormal gallbladder ultrasound 05/13/2020   Genetic testing 05/09/2020   Family history of breast cancer    Family history of colon cancer    Family history of uterine cancer    Family history of lung cancer    Common bile duct dilatation 03/10/2020   Dyslipidemia 03/10/2020   Acute right flank pain 03/10/2020   Dysphagia 01/17/2020   GIST (gastrointestinal stroma tumor), malignant, colon ([place]) 01/17/2020   Colon cancer screening 01/17/2020   GERD (gastroesophageal reflux disease) 01/17/2020    Past Surgical History:   Procedure Laterality Date   BREAST BIOPSY Right 09/06/2019   [facility]; evaluation of suspicious calcification of right breast; pathology with atypical ductal hyperplasia, focal cystic dilation of ducts with columnar cell change of epithelium, presence of microcalcifications.   BREAST BIOPSY Right 11/06/2020   Procedure: BREAST BIOPSY;  Surgeon: Aviva Signs, MD;  Location: [facility];  Service: General;  Laterality: Right;   BREAST LUMPECTOMY WITH RADIOFREQUENCY TAG IDENTIFICATION Right 11/06/2020   Procedure: BREAST LUMPECTOMY WITH RADIOFREQUENCY TAG IDENTIFICATION;  Surgeon: Aviva Signs, MD;  Location: [facility];  Service: General;  Laterality: Right;   CATARACT EXTRACTION Right 03/28/2017   [facility]   CATARACT EXTRACTION Left 03/14/2017   [facility]   COLONOSCOPY  01/28/2013   Dr. Ok Edwards, [place], [place]; Hyperplastic polyp x2, sessile serrated adenoma x2, tubular adenoma with low-grade dysplasia x1.  Recommend repeat colonoscopy in 3 years.   COLONOSCOPY WITH PROPOFOL N/A 04/02/2020   Surgeon: Daneil Dolin, MD; Diverticulosis in the sigmoid and descending colon, cecal AVM, three 5-8 millimeters polyps in the rectum, mid rectum, and ascending colon resected and retrieved.  Pathology with 2 tubular adenomas, 1 hyperplastic polyp.  Recommended repeat colonoscopy in 5 years if health permits.   ESOPHAGOGASTRODUODENOSCOPY  04/24/2019   [place]; 2.4 cm gastric submucosal mass in the fundus consistent  with GIST.  FNA consistent with GIST.   ESOPHAGOGASTRODUODENOSCOPY  01/28/2013   Dr. Gwen Her Devers in [place], [place]; normal esophagus, hernia at GE junction, normal examined stomach, normal examined duodenum.  Does not appear dilation was performed.   ESOPHAGOGASTRODUODENOSCOPY (EGD) WITH PROPOFOL N/A 04/02/2020   Surgeon: Daneil Dolin, MD;  Erosive reflux esophagitis, somewhat dilated distal esophagus s/p 60 French Maloney dilation,  stigmata of prior gastric surgery, no evidence of recurrent/persisting GIST, normal examined duodenum.    GIST tumor resection  06/19/2019   [facility], robotic assisted laparoscopic partial gastrectomy x2.  Pathology consistent with GIST   HELLER MYOTOMY  06/19/2019   [facility]; esophageal myotomy with hiatal hernia repair and dor fundoplication   left hand surgery     MALONEY DILATION N/A 04/02/2020   Procedure: Venia Minks DILATION;  Surgeon: Daneil Dolin, MD;  Location: [facility];  Service: Endoscopy;  Laterality: N/A;   POLYPECTOMY  04/02/2020   Procedure: POLYPECTOMY;  Surgeon: Daneil Dolin, MD;  Location: [facility];  Service: Endoscopy;;   tubal ligation     UMBILICAL HERNIA REPAIR     1970s   UPPER ESOPHAGEAL ENDOSCOPIC ULTRASOUND (EUS)  04/24/2019   [facility]; endoscopic findings: Tortuous esophagus s/p balloon dilation to 18 mm with no effect s/p biopsy, submucosal mass in the fundus, normal duodenum; endosonographic findings: 2.4 cm x 2.4 cm submucosal mass in the fundus s/p FNA, cholelithiasis, no hepatic, CBD, or pancreatic abnormalities.  Pathology: GIST, hepatic parenchyma with mild steato-fibrosis, reflux esophagitis   YAG laser posterior capsulotomy Left 08/30/2018   [facility], left eye.    OB History   No obstetric history on file.      Home Medications    Prior to Admission medications   Medication Sig Start Date End Date Taking? Authorizing Provider  valACYclovir (VALTREX) 1000 MG tablet Take 1 tablet (1,000 mg total) by mouth 3 (three) times daily for 10 days. 02/10/22 02/20/22 Yes Eulogio Bear, NP  acetaminophen (TYLENOL) 500 MG tablet Take 500 mg by mouth every 6 (six) hours as needed for moderate pain.    [provider]  ascorbic acid (VITAMIN C) 1000 MG tablet Take 1 tablet by mouth daily.    [provider]  baclofen (LIORESAL) 10 MG tablet Take 10 mg by  mouth 3 (three) times daily.    [provider]  Calcium Carbonate-Vitamin D3 600-400 MG-UNIT TABS Take 1 tablet by mouth daily.    [provider]  Cholecalciferol 50 MCG (2000 UT) TABS Take by mouth.    [provider]  imatinib (GLEEVEC) 400 MG tablet 1 tablet with a meal and a large glass of water Orally Once a day    [provider]  losartan (COZAAR) 25 MG tablet Take 25 mg by mouth daily. 08/22/20   [provider]  pantoprazole (PROTONIX) 40 MG tablet Take 1 tablet (40 mg total) by mouth daily. 03/19/21   Erenest Rasher, PA-C  Probiotic Product ([place]) CAPS Take 1 capsule by mouth daily.    [provider]  rosuvastatin (CRESTOR) 10 MG tablet Take 10 mg by mouth daily.    [provider]  tamoxifen (NOLVADEX) 20 MG tablet Take 1 tablet (20 mg total) by mouth daily. 02/07/22   Derek Jack, MD    Family History Family History  Problem Relation Age of Onset   Stroke Mother    Hypertension Mother  Hearing loss Mother    Cancer Mother        d. 73 "where the stomach meets the intestine" "apple-core"   Heart disease Mother    Varicose Veins Mother    Stroke Father    Heart disease Father    Hearing loss Father    Lung cancer Brother 21       smoker   Hearing loss Brother    Breast cancer Maternal Aunt        unknown age of diagnosis   Breast cancer Maternal Aunt        unknown age of diagnosis   Depression Maternal Grandmother    Diabetes Maternal Grandfather    Colon cancer Paternal Grandmother        dx in her 51s   Hypertension Daughter    Heart disease Daughter    Hearing loss Daughter    Diabetes Daughter    Depression Daughter    Vaginal cancer Daughter        dx <53   Uterine cancer Daughter        dx <53   Breast cancer Daughter        dx <53   Drug abuse Daughter    Kidney disease Daughter    Miscarriages / [place] Daughter    Breast cancer Cousin        unknown  age of diagnosis (maternal first cousin)   Breast cancer Cousin        unknown age of diagnosis (maternal first cousin)   62 / [place] Granddaughter    Hearing loss Granddaughter    Drug abuse Granddaughter    Depression Granddaughter     Social History Social History   Tobacco Use   Smoking status: Every Day    Packs/day: 1.00    Years: 47.00    Total pack years: 47.00    Types: Cigarettes   Smokeless tobacco: Never  Vaping Use   Vaping Use: Never used  Substance Use Topics   Alcohol use: Yes    Comment: glass of wine occ   Drug use: Never     Allergies   Penicillins, Somatropin, Tilactase, Tape, Indomethacin, Tolectin [tolmetin], Sulfa antibiotics, and Wound dressing adhesive   Review of Systems Review of Systems Per HPI  Physical Exam Triage Vital Signs ED Triage Vitals  Enc Vitals Group     BP 02/10/22 1326 (!) 155/83     Pulse Rate 02/10/22 1326 64     Resp 02/10/22 1326 18     Temp 02/10/22 1326 98.5 F (36.9 C)     Temp Source 02/10/22 1326 Oral     SpO2 02/10/22 1326 92 %     Weight --      Height --      Head Circumference --      Peak Flow --      Pain Score 02/10/22 1327 0     Pain Loc --      Pain Edu? --      Excl. in [place]? --    No data found.  Updated Vital Signs BP (!) 155/83 (BP Location: Right Arm)   Pulse 64   Temp 98.5 F (36.9 C) (Oral)   Resp 18   SpO2 92%   Visual Acuity Right Eye Distance:   Left Eye Distance:   Bilateral Distance:    Right Eye Near:   Left Eye Near:    Bilateral Near:     Physical Exam Vitals and nursing  note reviewed.  Constitutional:      General: She is not in acute distress.    Appearance: Normal appearance. She is not toxic-appearing.  HENT:     Head: Normocephalic and atraumatic.     Mouth/Throat:     Mouth: Mucous membranes are moist.     Pharynx: Oropharynx is clear.  Eyes:     General: No scleral icterus.    Extraocular Movements: Extraocular movements intact.   Pulmonary:     Effort: Pulmonary effort is normal. No respiratory distress.  Skin:    General: Skin is warm and dry.     Capillary Refill: Capillary refill takes less than 2 seconds.     Findings: Erythema and rash present.          Comments: Multiple erythematous lesions to right flank in approximately area marked.  Lesions appear flat.  No vesicles, blisters, or scaling.  No warmth.  Neurological:     Mental Status: She is alert and oriented to person, place, and time.  Psychiatric:        Behavior: Behavior is cooperative.      UC Treatments / Results  Labs (all labs ordered are listed, but only abnormal results are displayed) Labs Reviewed - No data to display  EKG   Radiology No results found.  Procedures Procedures (including critical care time)  Medications Ordered in UC Medications - No data to display  Initial Impression / Assessment and Plan / UC Course  I have reviewed the triage vital signs and the nursing notes.  Pertinent labs & imaging results that were available during my care of the patient were reviewed by me and considered in my medical decision making (see chart for details).   Patient is well-appearing, normotensive, afebrile, not tachycardic, not tachypneic, oxygenating well on room air.    1. Herpes zoster without complication Treat with Valtrex 1 g 3 times daily for 10 days Recommended covering if blisters develop in open Supportive care discussed Return for worsening or persistent symptoms despite treatment  The patient was given the opportunity to ask questions.  All questions answered to their satisfaction.  The patient is in agreement to this plan.    Final Clinical Impressions(s) / UC Diagnoses   Final diagnoses:  Herpes zoster without complication     Discharge Instructions      The rash does appear to be shingles.  Start the Valacyclovir to help prevent new areas from developing.  If the rash turns to blisters and they open  and start draining, this is when the virus is most contagious, so please keep it covered with a nonadherent gauze.  Seek care if symptoms persist or worsen despite treatment.    ED Prescriptions     Medication Sig Dispense Auth. Provider   valACYclovir (VALTREX) 1000 MG tablet Take 1 tablet (1,000 mg total) by mouth 3 (three) times daily for 10 days. 30 tablet Eulogio Bear, NP      PDMP not reviewed this encounter.   Eulogio Bear, NP 02/10/22 [phone]

## 2022-02-10 NOTE — Discharge Instructions (Addendum)
The rash does appear to be shingles.  Start the Valacyclovir to help prevent new areas from developing.  If the rash turns to blisters and they open and start draining, this is when the virus is most contagious, so please keep it covered with a nonadherent gauze.  Seek care if symptoms persist or worsen despite treatment.

## 2022-02-10 NOTE — ED Triage Notes (Signed)
Rash along right flank area under shoulder blade since last night.

## 2022-02-22 ENCOUNTER — Other Ambulatory Visit: Payer: Self-pay | Admitting: Hematology

## 2022-02-22 NOTE — Telephone Encounter (Signed)
Tamoxifen refill approved.  Patient is tolerating and is to continue therapy.

## 2022-03-09 DIAGNOSIS — Z23 Encounter for immunization: Secondary | ICD-10-CM | POA: Diagnosis not present

## 2022-03-09 DIAGNOSIS — R7303 Prediabetes: Secondary | ICD-10-CM | POA: Diagnosis not present

## 2022-03-09 DIAGNOSIS — Z79899 Other long term (current) drug therapy: Secondary | ICD-10-CM | POA: Diagnosis not present

## 2022-03-09 DIAGNOSIS — E785 Hyperlipidemia, unspecified: Secondary | ICD-10-CM | POA: Diagnosis not present

## 2022-03-09 DIAGNOSIS — R413 Other amnesia: Secondary | ICD-10-CM | POA: Diagnosis not present

## 2022-03-09 DIAGNOSIS — I10 Essential (primary) hypertension: Secondary | ICD-10-CM | POA: Diagnosis not present

## 2022-03-09 DIAGNOSIS — Z720 Tobacco use: Secondary | ICD-10-CM | POA: Diagnosis not present

## 2022-03-15 ENCOUNTER — Encounter: Payer: Self-pay | Admitting: Physician Assistant

## 2022-03-15 ENCOUNTER — Telehealth (HOSPITAL_COMMUNITY): Payer: Self-pay

## 2022-03-25 ENCOUNTER — Encounter: Payer: Self-pay | Admitting: Physician Assistant

## 2022-03-25 ENCOUNTER — Ambulatory Visit: Payer: 59

## 2022-03-25 ENCOUNTER — Other Ambulatory Visit (INDEPENDENT_AMBULATORY_CARE_PROVIDER_SITE_OTHER): Payer: 59

## 2022-03-25 ENCOUNTER — Ambulatory Visit (INDEPENDENT_AMBULATORY_CARE_PROVIDER_SITE_OTHER): Payer: 59 | Admitting: Physician Assistant

## 2022-03-25 VITALS — BP 110/80 | Resp 18 | Ht <= 58 in | Wt 158.0 lb

## 2022-03-25 DIAGNOSIS — R413 Other amnesia: Secondary | ICD-10-CM

## 2022-03-25 LAB — VITAMIN B12: Vitamin B-12: 175 pg/mL — ABNORMAL LOW (ref 211–911)

## 2022-03-25 LAB — TSH: TSH: 5.28 u[IU]/mL (ref 0.35–5.50)

## 2022-03-25 MED ORDER — MEMANTINE HCL 5 MG PO TABS: ORAL_TABLET | ORAL | 11 refills | Status: DC

## 2022-03-25 NOTE — Patient Instructions (Addendum)
It was a pleasure to see you today at our office.   Recommendations:  Neurocognitive evaluation at our office MRI of the brain, the radiology office will call you to arrange you appointment Check labs today Start Memantine 5mg  tablets.  Take 1 tablet at bedtime for 2 weeks, then 1 tablet twice daily.    Follow up in 3 months   Whom to call:  Memory  decline, memory medications: Call our office [phone]   For psychiatric meds, mood meds: Please have your primary care physician manage these medications.       For assessment of decision of mental capacity and competency:  Call Dr. Anthoney Harada, geriatric psychiatrist at [phone]  For guidance in geriatric dementia issues please call [facility] [phone]     If you have any severe symptoms of a stroke, or other severe issues such as confusion,severe chills or fever, etc call 911 or go to the ER as you may need to be evaluated further        You have been referred for a neuropsychological evaluation (i.e., evaluation of memory and thinking abilities). Please bring someone with you to this appointment if possible, as it is helpful for the doctor to hear from both you and another adult who knows you well. Please bring eyeglasses and hearing aids if you wear them.    The evaluation will take approximately 3 hours and has two parts:   The first part is a clinical interview with the neuropsychologist (Dr. Melvyn Novas or Dr. Nicole Kindred). During the interview, the neuropsychologist will speak with you and the individual you brought to the appointment.    The second part of the evaluation is testing with the doctor's technician Hinton Dyer or Maudie Mercury). During the testing, the technician will ask you to remember different types of material, solve problems, and answer some questionnaires. Your family member will not be present for this portion of the evaluation.   Please note: We must reserve several hours of the neuropsychologist's time  and the psychometrician's time for your evaluation appointment. As such, there is a No-Show fee of $100. If you are unable to attend any of your appointments, please contact our office as soon as possible to reschedule.      RECOMMENDATIONS FOR ALL PATIENTS WITH MEMORY PROBLEMS: 1. Continue to exercise (Recommend 30 minutes of walking everyday, or 3 hours every week) 2. Increase social interactions - continue going to [place] and enjoy social gatherings with friends and family 3. Eat healthy, avoid fried foods and eat more fruits and vegetables 4. Maintain adequate blood pressure, blood sugar, and blood cholesterol level. Reducing the risk of stroke and cardiovascular disease also helps promoting better memory. 5. Avoid stressful situations. Live a simple life and avoid aggravations. Organize your time and prepare for the next day in anticipation. 6. Sleep well, avoid any interruptions of sleep and avoid any distractions in the bedroom that may interfere with adequate sleep quality 7. Avoid sugar, avoid sweets as there is a strong link between excessive sugar intake, diabetes, and cognitive impairment We discussed the Mediterranean diet, which has been shown to help patients reduce the risk of progressive memory disorders and reduces cardiovascular risk. This includes eating fish, eat fruits and green leafy vegetables, nuts like almonds and hazelnuts, walnuts, and also use olive oil. Avoid fast foods and fried foods as much as possible. Avoid sweets and sugar as sugar use has been linked to worsening of memory function.  There is always a concern  of gradual progression of memory problems. If this is the case, then we may need to adjust level of care according to patient needs. Support, both to the patient and caregiver, should then be put into place.      FALL PRECAUTIONS: Be cautious when walking. Scan the area for obstacles that may increase the risk of trips and falls. When getting up in the  mornings, sit up at the edge of the bed for a few minutes before getting out of bed. Consider elevating the bed at the head end to avoid drop of blood pressure when getting up. Walk always in a well-lit room (use night lights in the walls). Avoid area rugs or power cords from appliances in the middle of the walkways. Use a walker or a cane if necessary and consider physical therapy for balance exercise. Get your eyesight checked regularly.  FINANCIAL OVERSIGHT: Supervision, especially oversight when making financial decisions or transactions is also recommended.  HOME SAFETY: Consider the safety of the kitchen when operating appliances like stoves, microwave oven, and blender. Consider having supervision and share cooking responsibilities until no longer able to participate in those. Accidents with firearms and other hazards in the house should be identified and addressed as well.   ABILITY TO BE LEFT ALONE: If patient is unable to contact 911 operator, consider using LifeLine, or when the need is there, arrange for someone to stay with patients. Smoking is a fire hazard, consider supervision or cessation. Risk of wandering should be assessed by caregiver and if detected at any point, supervision and safe proof recommendations should be instituted.  MEDICATION SUPERVISION: Inability to self-administer medication needs to be constantly addressed. Implement a mechanism to ensure safe administration of the medications.   DRIVING: Regarding driving, in patients with progressive memory problems, driving will be impaired. We advise to have someone else do the driving if trouble finding directions or if minor accidents are reported. Independent driving assessment is available to determine safety of driving.   If you are interested in the driving assessment, you can contact the following:  [employer] in [place]  [place] [phone] or [phone]    [place] refers to food and lifestyle choices that are based on the traditions of countries located on the [employer]. This way of eating has been shown to help prevent certain conditions and improve outcomes for people who have chronic diseases, like kidney disease and heart disease. What are tips for following this plan? Lifestyle  Cook and eat meals together with your family, when possible. Drink enough fluid to keep your urine clear or pale yellow. Be physically active every day. This includes: Aerobic exercise like running or swimming. Leisure activities like gardening, walking, or housework. Get 7-8 hours of sleep each night. If recommended by your health care provider, drink red wine in moderation. This means 1 glass a day for nonpregnant women and 2 glasses a day for men. A glass of wine equals 5 oz (150 mL). Reading food labels  Check the serving size of packaged foods. For foods such as rice and pasta, the serving size refers to the amount of cooked product, not dry. Check the total fat in packaged foods. Avoid foods that have saturated fat or trans fats. Check the ingredients list for added sugars, such as corn syrup. Shopping  At the grocery store, buy most of your food from the areas  near the walls of the store. This includes: Fresh fruits and vegetables (produce). Grains, beans, nuts, and seeds. Some of these may be available in unpackaged forms or large amounts (in bulk). Fresh seafood. Poultry and eggs. Low-fat dairy products. Buy whole ingredients instead of prepackaged foods. Buy fresh fruits and vegetables in-season from local farmers markets. Buy frozen fruits and vegetables in resealable bags. If you do not have access to quality fresh seafood, buy precooked frozen shrimp or canned fish, such as tuna, salmon, or sardines. Buy small amounts of raw or cooked  vegetables, salads, or olives from the deli or salad bar at your store. Stock your pantry so you always have certain foods on hand, such as olive oil, canned tuna, canned tomatoes, rice, pasta, and beans. Cooking  Cook foods with extra-virgin olive oil instead of using butter or other vegetable oils. Have meat as a side dish, and have vegetables or grains as your main dish. This means having meat in small portions or adding small amounts of meat to foods like pasta or stew. Use beans or vegetables instead of meat in common dishes like chili or lasagna. Experiment with different cooking methods. Try roasting or broiling vegetables instead of steaming or sauteing them. Add frozen vegetables to soups, stews, pasta, or rice. Add nuts or seeds for added healthy fat at each meal. You can add these to yogurt, salads, or vegetable dishes. Marinate fish or vegetables using olive oil, lemon juice, garlic, and fresh herbs. Meal planning  Plan to eat 1 vegetarian meal one day each week. Try to work up to 2 vegetarian meals, if possible. Eat seafood 2 or more times a week. Have healthy snacks readily available, such as: Vegetable sticks with hummus. Greek yogurt. Fruit and nut trail mix. Eat balanced meals throughout the week. This includes: Fruit: 2-3 servings a day Vegetables: 4-5 servings a day Low-fat dairy: 2 servings a day Fish, poultry, or lean meat: 1 serving a day Beans and legumes: 2 or more servings a week Nuts and seeds: 1-2 servings a day Whole grains: 6-8 servings a day Extra-virgin olive oil: 3-4 servings a day Limit red meat and sweets to only a few servings a month What are my food choices? Mediterranean diet Recommended Grains: Whole-grain pasta. Brown rice. Bulgar wheat. Polenta. Couscous. Whole-wheat bread. [place]. Vegetables: Artichokes. Beets. Broccoli. Cabbage. Carrots. Eggplant. Green beans. Chard. Kale. Spinach. Onions. Leeks. Peas. Squash. Tomatoes. Peppers.  Radishes. Fruits: Apples. Apricots. Avocado. Berries. Bananas. Cherries. Dates. Figs. Grapes. Lemons. Melon. Oranges. Peaches. Plums. Pomegranate. Meats and other protein foods: Beans. Almonds. Sunflower seeds. Pine nuts. Peanuts. [place]. Salmon. Scallops. Shrimp. [place]. Tilapia. Clams. Oysters. Eggs. Dairy: Low-fat milk. Cheese. Greek yogurt. Beverages: Water. Red wine. Herbal tea. Fats and oils: Extra virgin olive oil. Avocado oil. Grape seed oil. Sweets and desserts: [place] yogurt with honey. Baked apples. Poached pears. Trail mix. Seasoning and other foods: Basil. Cilantro. Coriander. Cumin. Mint. Parsley. Sage. Rosemary. Tarragon. Garlic. Oregano. Thyme. Pepper. Balsalmic vinegar. Tahini. Hummus. Tomato sauce. Olives. Mushrooms. Limit these Grains: Prepackaged pasta or rice dishes. Prepackaged cereal with added sugar. Vegetables: Deep fried potatoes (french fries). Fruits: Fruit canned in syrup. Meats and other protein foods: Beef. Pork. Lamb. Poultry with skin. Hot dogs. [place] Salines. Dairy: Ice cream. Sour cream. Whole milk. Beverages: Juice. Sugar-sweetened soft drinks. Beer. Liquor and spirits. Fats and oils: Butter. Canola oil. Vegetable oil. Beef fat (tallow). Lard. Sweets and desserts: Cookies. Cakes. Pies. Candy. Seasoning and other foods: Mayonnaise. Premade sauces and marinades.  The items listed may not be a complete list. Talk with your dietitian about what dietary choices are right for you. Summary The Mediterranean diet includes both food and lifestyle choices. Eat a variety of fresh fruits and vegetables, beans, nuts, seeds, and whole grains. Limit the amount of red meat and sweets that you eat. Talk with your health care provider about whether it is safe for you to drink red wine in moderation. This means 1 glass a day for nonpregnant women and 2 glasses a day for men. A glass of wine equals 5 oz (150 mL). This information is not intended to replace advice given to you by your health  care provider. Make sure you discuss any questions you have with your health care provider. Document Released: 08/20/2015 Document Revised: 09/22/2015 Document Reviewed: 08/20/2015 Elsevier Interactive Patient Education  2017 [address]

## 2022-03-25 NOTE — Progress Notes (Signed)
Assessment/Plan:    The patient is seen in neurologic consultation at the request of Coolidge Breeze, FNP for the evaluation of memory.  Cassandra Fowler is a very pleasant 77 y.o. 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  MoCA today is 25/30. Workup is in progress. Patient is able to perform most ADLs but needs some guidance from daughter..   Recommendations are as follows:   Memory Impairment  MRI brain without contrast to assess for underlying structural abnormality and assess vascular load  Neurocognitive testing to further evaluate cognitive concerns and determine other underlying cause of memory changes, including potential contribution from sleep, anxiety, or depression  Check B12, TSH Start Memantine 5mg  tablets.  Take 1 tablet at bedtime for 2 weeks, then 1 tablet twice daily.   Side effects discussed [place] up in 3 months     Subjective:    The patient is accompanied by her daughter who supplements the history.    How long did patient have memory difficulties?  About 3 years. Patient has some difficulty remembering recent conversations and people names, although is able to participate in most activities.  She has sometimes trouble recognizing people, or remembering appointments and medications.  Her LTM is better than the short-term memory.  She enjoys doing crossword puzzles and word finding.  She also enjoys Oceanographer. repeats oneself?  Endorsed Disoriented when walking into a room?  Patient denies, trying to adapt to the new home, as they recently moved from [place].   Leaving objects in unusual places?  Endorsed, for example losing the phone and finding it in the microwave, coffee cups in the oven etc.  Wandering behavior? denies   Any personality changes since last visit?  Patient  denies, but daughter reports that she has more mood swings for the last 1-1/2 years, and she can get argumentative.   Any history of depression?: Endorsed, "depression runs in the family ", although she has not been formally diagnosed or treated.  She does not have a psychotherapist. Hallucinations or paranoia?  denies   Seizures? denies    Any sleep changes?  "Most of the time "-daughter says. Denies  vivid dreams, REM behavior or sleepwalking   Sleep apnea? "They are going  to check on that because she snores very loud" Any hygiene concerns? Endorsed for the last 1.5 y, needs reminder, "2 weeks come back by and she does not take a shower ".  Independent of bathing and dressing?  Endorsed, has arthritis so she needs help with buttoning.  Does the patient need help with medications?  Daughter is in charge over the last 6 months because she was missing doses. She is now on pillpacks  Who is in charge of the finances?  Daughter is in charge over the last 5 months because she was missing payments Any changes in appetite?  Eats well, she may forget to eat at times, but she may be able to snack.    Patient have trouble swallowing?  denies   Does the patient cook?  Any kitchen accidents such as leaving the stove on? Patient denies   Any headaches? She has GERD and achalasia so she had esophageal dilatation and  Botox injections with good results  Chronic  pain?  Endorsed due to arthritis  Ambulates with difficulty? Uses a walker and  cane for stability. She is going to try PT as OP  Recent falls or head injuries? denies     Vision changes? Had "scar tissue on the R after cataracts, L has retinal detachment history, defers eye surgery "because she has to sleep on her stomach  for 30 days and does not want to do it.   Unilateral weakness, numbness or tingling?  denies   Any tremors?  Denies  Any anosmia? "Always been off" Any incontinence of urine?Endorsed, uses Depends  Any bowel dysfunction?    denies       Patient lives  daughter  History of heavy alcohol intake? denies   History of heavy tobacco use? denies   Family history of dementia?  Her mother had dementia ?type  Dose patient drive? short distances, only during the day, not frequently , lately she does not feel comfortable anymore    Allergies  Allergen Reactions   Penicillins Hives, Swelling and Other (See Comments)   Somatropin Anaphylaxis   Tilactase Anaphylaxis   Tape Rash   Indomethacin Hives   Tolectin [Tolmetin] Hives    Pt can not recall   Sulfa Antibiotics Hives and Rash   Wound Dressing Adhesive Rash    Adhesive tape     Current Outpatient Medications  Medication Instructions   acetaminophen (TYLENOL) 500 mg, Oral, Every 6 hours PRN   ascorbic acid (VITAMIN C) 1000 MG tablet 1 tablet, Oral, Daily   baclofen (LIORESAL) 10 mg, Oral, 3 times daily   Calcium Carbonate-Vitamin D3 600-400 MG-UNIT TABS 1 tablet, Oral, Daily   Cholecalciferol 50 MCG (2000 UT) TABS Oral   imatinib (GLEEVEC) 400 MG tablet 1 tablet with a meal and a large glass of water Orally Once a day   losartan (COZAAR) 25 mg, Oral, Daily   memantine (NAMENDA) 5 MG tablet Take 1 tablet (5 mg at night) for 2 weeks, then increase to 1 tablet (5 mg) twice a day   pantoprazole (PROTONIX) 40 mg, Oral, Daily   Probiotic Product (PHILLIPS COLON HEALTH) CAPS 1 capsule, Oral, Daily   rosuvastatin (CRESTOR) 10 mg, Oral, Daily   tamoxifen (NOLVADEX) 20 mg, Oral, Daily     VITALS:   Vitals:   03/25/22 0756  BP: 110/80  Resp: 18  Weight: 158 lb (71.7 kg)  Height: 4\' 10"  (1.473 m)       PHYSICAL EXAM   HEENT:  Normocephalic, atraumatic. The mucous membranes are moist. The superficial temporal arteries are without ropiness or tenderness. Cardiovascular: Regular rate and rhythm. Lungs: Clear to auscultation bilaterally. Neck: There are no carotid bruits noted bilaterally.  NEUROLOGICAL:    03/25/2022    8:00 AM  Montreal Cognitive Assessment    Visuospatial/ Executive (0/5) 4  Naming (0/3) 3  Attention: Read list of digits (0/2) 2  Attention: Read list of letters (0/1) 1  Attention: Serial 7 subtraction starting at 100 (0/3) 3  Language: Repeat phrase (0/2) 2  Language : Fluency (0/1) 1  Abstraction (0/2) 0  Delayed Recall (0/5) 3  Orientation (0/6) 6  Total 25  Adjusted Score (based on education) 25        No data to display           Orientation:  Alert and oriented to person, place and time. No aphasia or dysarthria. Fund of knowledge is appropriate. Recent memory impaired and remote memory intact.  Attention and concentration are normal.  Able to name objects and repeat phrases. Delayed recall 3/5 Cranial nerves:  There is good facial symmetry.  Edentulous.  Extraocular muscles are intact and visual fields are full to confrontational testing. Speech is fluent and clear. no tongue deviation. Hearing is decreased to conversational tone. Tone: Tone is good throughout. Sensation: Sensation is intact to light touch and pinprick throughout. Vibration is intact at the bilateral big toe.There is no extinction with double simultaneous stimulation. There is no sensory dermatomal level identified. Coordination: The patient has no difficulty with RAM's or FNF bilaterally. Normal finger to nose  Motor: Strength is 5/5 in the bilateral upper and lower extremities. There is no pronator drift. There are no fasciculations noted. DTR's: Deep tendon reflexes are 2/4 at the bilateral biceps, triceps, brachioradialis, patella and achilles.  Plantar responses are downgoing bilaterally. Gait and Station: The patient is able to ambulate without difficulty.The patient is able to ambulate in a tandem fashion, able to stand in the Romberg position.     Thank you for allowing [place] the opportunity to participate in the care of this nice patient. Please do not hesitate to contact us for any questions or concerns.   Total time spent on today's visit  was 60 minutes dedicated to this patient today, preparing to see patient, examining the patient, ordering tests and/or medications and counseling the patient, documenting clinical information in the EHR or other health record, independently interpreting results and communicating results to the patient/family, discussing treatment and goals, answering patient's questions and coordinating care.  Cc:  Coolidge Breeze, FNP  Sharene Butters 03/25/2022 8:52 AM

## 2022-03-30 ENCOUNTER — Encounter: Payer: Self-pay | Admitting: Psychology

## 2022-03-30 DIAGNOSIS — R7303 Prediabetes: Secondary | ICD-10-CM | POA: Insufficient documentation

## 2022-03-31 ENCOUNTER — Ambulatory Visit (INDEPENDENT_AMBULATORY_CARE_PROVIDER_SITE_OTHER): Payer: 59 | Admitting: Psychology

## 2022-03-31 ENCOUNTER — Encounter: Payer: Self-pay | Admitting: Psychology

## 2022-03-31 ENCOUNTER — Ambulatory Visit: Payer: 59 | Admitting: Psychology

## 2022-03-31 DIAGNOSIS — G4720 Circadian rhythm sleep disorder, unspecified type: Secondary | ICD-10-CM

## 2022-03-31 DIAGNOSIS — R4189 Other symptoms and signs involving cognitive functions and awareness: Secondary | ICD-10-CM

## 2022-03-31 DIAGNOSIS — F411 Generalized anxiety disorder: Secondary | ICD-10-CM | POA: Diagnosis not present

## 2022-03-31 NOTE — Progress Notes (Addendum)
NEUROPSYCHOLOGICAL EVALUATION [place] Department of Neurology  Date of Evaluation: March 31, 2022  Reason for Referral:   Cassandra Fowler is a 77 y.o. left-handed Caucasian female referred by Sharene Butters, PA-C, to characterize her current cognitive functioning and assist with diagnostic clarity and treatment planning in the context of subjective cognitive decline.   Assessment and Plan:   Clinical Impression(s): Cassandra Fowler pattern of performance is suggestive of neuropsychological functioning generally within normal limits relative to age-matched peers. Some performance variability was exhibited across executive functioning, particularly surrounding cognitive flexibility and response inhibition. Performances across tasks assessing verbal reasoning and safety/judgment were appropriate. Performances were also appropriate across processing speed, attention/concentration, receptive and expressive language, visuospatial abilities, and all aspects of learning and memory.   Specific to memory, Cassandra Fowler was able to learn novel verbal and visual information adequately and retained this knowledge after lengthy delays well. Overall, memory performance combined with intact performances across other areas of cognitive functioning is not suggestive of Alzheimer's disease. Likewise, her cognitive and behavioral profile is not suggestive of any other form of neurodegenerative illness presently. Current test scores absolutely do not warrant a dementia diagnosis. Subjective day-to-day cognitive dysfunction may simply represent age-related changes which are exacerbated by significant chronic pain, ongoing sleep dysfunction, psychosocial stressors, and hearing loss. There could also be a cerebrovascular contribution given her medical history. However, this remains speculative as no neuroimaging has been performed to date.   Functionally, Cassandra Fowler's daughter  described ongoing difficulties, stating that she has had to fully taken over medication, financial, and bill paying responsibilities due to errors being made in the past. While this is certainly concerning, intact neurocognitive testing would not suggest an underlying neurological cause for this change in functioning at the present time. It may be that a combination of non-neurological factors may interfere with daily functioning. This can include physical limitations and chronic pain, fluctuations in motivation and energy levels, and ongoing psychiatric distress or other psychosocial stressors. Cassandra Fowler daughter also expressed concern surrounding her mother being actively scammed online and that she has lost money in the past. This also is concerning and the validity of these concerns should be thoroughly investigated. Unfortunately, cognitively intact individuals of all ages are the victims of online scams every day and should this be Cassandra Fowler's case, this by itself would not yield a dementia diagnosis.   Recommendations: A repeat neuropsychological evaluation in 24 months (or sooner if functional decline is noted) is recommended to assess the trajectory of future cognitive decline should it occur. This will also aid in future efforts towards improved diagnostic clarity.  Her daughter did describe several red flags for sleep apnea, including significant snoring and instances where [name] has seemingly stopped breathing while asleep to the extent that her daughter felt is necessary to check that she was still alive. Based on this, a referral for a laboratory sleep study is certainly reasonable. She should discuss this with [name] or her PCP.   Performance across neurocognitive testing is not a strong predictor of an individual's safety operating a motor vehicle. Should her family wish to pursue a formalized driving evaluation, they could reach out to the following agencies: [employer]  [employer] in [place]: [phone] Driver Rehabilitative Services: [facility]: [place]: [phone] or [phone]  [name] is encouraged to attend to lifestyle factors for brain health (e.g., regular physical exercise, good nutrition habits and consideration of the MIND-DASH diet,  regular participation in cognitively-stimulating activities, and general stress management techniques), which are likely to have benefits for both emotional adjustment and cognition. In fact, in addition to promoting good general health, regular exercise incorporating aerobic activities (e.g., brisk walking, jogging, cycling, etc.) has been demonstrated to be a very effective treatment for depression and stress, with similar efficacy rates to both antidepressant medication and psychotherapy. Optimal control of vascular risk factors (including safe cardiovascular exercise and adherence to dietary recommendations) is encouraged. Continued participation in activities which provide mental stimulation and social interaction is also recommended.   Memory can be improved using internal strategies such as rehearsal, repetition, chunking, mnemonics, association, and imagery. External strategies such as written notes in a consistently used memory journal, visual and nonverbal auditory cues such as a calendar on the refrigerator or appointments with alarm, such as on a cell phone, can also help maximize recall.    When learning new information, she would benefit from information being broken up into small, manageable pieces. She may also find it helpful to articulate the material in her own words and in a context to promote encoding at the onset of a new task. This material may need to be repeated multiple times to promote encoding.  To address problems with fluctuating attention and/or executive dysfunction, she may wish to consider:   -Avoiding external distractions  when needing to concentrate   -Limiting exposure to fast paced environments with multiple sensory demands   -Writing down complicated information and using checklists   -Attempting and completing one task at a time (i.e., no multi-tasking)   -Verbalizing aloud each step of a task to maintain focus   -Taking frequent breaks during the completion of steps/tasks to avoid fatigue   -Reducing the amount of information considered at one time   -Scheduling more difficult activities for a time of day where she is usually most alert  Review of Records:   Ms. Delahanty was seen by [facility] Sharene Butters, PA-C) on 03/25/2022 for an evaluation of memory loss. At that time, difficulties were described as being present for the past three years. Primary examples included trouble remembering recent conversations or people's names. There was also report of her not recognizing familiar individuals and forgetting upcoming appointments. Misplacing objects was noted, including instances where objects are found in unusual locations (e.g., finding the phone in the microwave or coffee mugs in the oven). Functionally, her daughter has taken over financial management and bill paying. She also has taken over medication management and Ms. Sherrin has greatly reduced her driving. Performance on a brief cognitive screening instrument (MOCA) was 25/30. Ultimately, Ms. Cromartie was referred for a comprehensive neuropsychological evaluation to characterize her cognitive abilities and to assist with diagnostic clarity and treatment planning.   A brain MRI had been recently ordered and scheduled for 04/15/2022. No neuroimaging was available for review.   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 The report date is 05/09/2020     The Multi-Cancer + RNA Panel offered by [facility] includes sequencing and/or deletion/duplication analysis of the following 84 genes:  AIP*, ALK, APC*, ATM*, AXIN2*,    GERD (gastroesophageal reflux disease)    GIST (gastrointestinal stroma tumor), malignant, colon    s/p resection of 2 tumors in June 2021 at [facility] [place] resection of 2 tumors in June 2021 at [facility] in [place]   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   Pre-diabetes    Pulmonary nodule, left 11/25/2020   6 mm LUL on CT 05/20/2020   Sclerosing adenosis of right breast    Tobacco use 08/05/2021    Past Surgical History:  Procedure Laterality Date   BREAST BIOPSY Right 09/06/2019   [facility]; evaluation of suspicious calcification of right breast; pathology with atypical ductal hyperplasia, focal cystic dilation of ducts with columnar cell change of epithelium, presence of microcalcifications.   BREAST BIOPSY Right 11/06/2020   Procedure: BREAST BIOPSY;  Surgeon: Aviva Signs, MD;  Location: [facility] ORS;  Service: General;  Laterality: Right;   BREAST LUMPECTOMY WITH RADIOFREQUENCY TAG IDENTIFICATION Right 11/06/2020   Procedure: BREAST LUMPECTOMY WITH RADIOFREQUENCY TAG IDENTIFICATION;  Surgeon: Aviva Signs, MD;  Location: [facility] ORS;  Service: General;  Laterality: Right;   CATARACT EXTRACTION Right 03/28/2017   [facility]   CATARACT EXTRACTION Left  03/14/2017   [facility]   COLONOSCOPY  01/28/2013   Dr. Ok Edwards, [place], [place]; Hyperplastic polyp x2, sessile serrated adenoma x2, tubular adenoma with low-grade dysplasia x1.  Recommend repeat colonoscopy in 3 years.   COLONOSCOPY WITH PROPOFOL N/A 04/02/2020   Surgeon: Daneil Dolin, MD; Diverticulosis in the sigmoid and descending colon, cecal AVM, three 5-8 millimeters polyps in the rectum, mid rectum, and ascending colon resected and retrieved.  Pathology with 2 tubular adenomas, 1 hyperplastic polyp.  Recommended repeat colonoscopy in 5 years if health permits.   ESOPHAGOGASTRODUODENOSCOPY  04/24/2019   [place]; 2.4 cm gastric submucosal mass in the fundus consistent with GIST.  FNA consistent with GIST.   ESOPHAGOGASTRODUODENOSCOPY  01/28/2013   Dr. Gwen Her Devers in [place], [place]; normal esophagus, hernia at GE junction, normal examined stomach, normal examined duodenum.  Does not appear dilation was performed.   ESOPHAGOGASTRODUODENOSCOPY (EGD) WITH PROPOFOL N/A 04/02/2020   Surgeon: Daneil Dolin, MD;  Erosive reflux esophagitis, somewhat dilated distal esophagus s/p 60 French Maloney dilation, stigmata of prior gastric surgery, no evidence of recurrent/persisting GIST, normal examined duodenum.    GIST tumor resection  06/19/2019   [facility], robotic assisted laparoscopic partial gastrectomy x2.  Pathology consistent with GIST   HELLER MYOTOMY  06/19/2019   [facility]; esophageal myotomy with hiatal hernia repair and dor fundoplication   left hand surgery     MALONEY DILATION N/A 04/02/2020   Procedure: Venia Minks DILATION;  Surgeon: Daneil Dolin, MD;  Location: [facility] ENDO SUITE;  Service: Endoscopy;  Laterality: N/A;   POLYPECTOMY  04/02/2020   Procedure: POLYPECTOMY;  Surgeon: Daneil Dolin, MD;  Location: [facility];  Service: Endoscopy;;   tubal ligation     UMBILICAL HERNIA REPAIR     1970s   UPPER  ESOPHAGEAL ENDOSCOPIC ULTRASOUND (EUS)  04/24/2019   [facility]; endoscopic findings:  Tortuous esophagus s/p balloon dilation to 18 mm with no effect s/p biopsy, submucosal mass in the fundus, normal duodenum; endosonographic findings: 2.4 cm x 2.4 cm submucosal mass in the fundus s/p FNA, cholelithiasis, no hepatic, CBD, or pancreatic abnormalities.  Pathology: GIST, hepatic parenchyma with mild steato-fibrosis, reflux esophagitis   YAG laser posterior capsulotomy Left 08/30/2018   [facility], left eye.    Current Outpatient Medications:    acetaminophen (TYLENOL) 500 MG tablet, Take 500 mg by mouth every 6 (six) hours as needed for moderate pain., Disp: , Rfl:    ascorbic acid (VITAMIN C) 1000 MG tablet, Take 1 tablet by mouth daily., Disp: , Rfl:    baclofen (LIORESAL) 10 MG tablet, Take 10 mg by mouth 3 (three) times daily., Disp: , Rfl:    Calcium Carbonate-Vitamin D3 600-400 MG-UNIT TABS, Take 1 tablet by mouth daily., Disp: , Rfl:    Cholecalciferol 50 MCG (2000 UT) TABS, Take by mouth., Disp: , Rfl:    imatinib (GLEEVEC) 400 MG tablet, 1 tablet with a meal and a large glass of water Orally Once a day, Disp: , Rfl:    losartan (COZAAR) 25 MG tablet, Take 25 mg by mouth daily., Disp: , Rfl:    memantine (NAMENDA) 5 MG tablet, Take 1 tablet (5 mg at night) for 2 weeks, then increase to 1 tablet (5 mg) twice a day, Disp: 60 tablet, Rfl: 11   pantoprazole (PROTONIX) 40 MG tablet, Take 1 tablet (40 mg total) by mouth daily., Disp: 90 tablet, Rfl: 3   Probiotic Product ([place]) CAPS, Take 1 capsule by mouth daily., Disp: , Rfl:    rosuvastatin (CRESTOR) 10 MG tablet, Take 10 mg by mouth daily., Disp: , Rfl:    tamoxifen (NOLVADEX) 20 MG tablet, Take 1 tablet by mouth once daily., Disp: 90 tablet, Rfl: 0  Clinical Interview:   The following information was obtained during a clinical interview with Ms. Wittstruck and her daughter prior to  cognitive testing.  Cognitive Symptoms: Decreased short-term memory: Endorsed. While Ms. Fornwalt endorsed some short-term memory concerns, ("I'm losing my memory"), she generally minimized concerns, attributing them to age-related changes. Her primary example surrounded entering a room and forgetting her original intention. Her daughter described more significant concerns, including her mother forgetting recent conversations, not recognizing family individuals (e.g., her granddaughter), trouble recalling names, and being repetitive in conversation. While Ms. Villafuerte described stable memory dysfunction over the past six months, her daughter noted that changes have been progressively worsening over the past 2-3 years.  Decreased long-term memory: Denied. Decreased attention/concentration: Denied. However, her daughter did allude to some ongoing difficulties with sustained attention and increased distractibility.  Reduced processing speed: Endorsed "occasionally." Difficulties with executive functions: Denied. However, her daughter reported ongoing difficulties with organization, multitasking, and decision making. Regarding the latter, her daughter also expressed concerns surrounding her mother being actively scammed online under the guise of an online romance. She noted that her mother has been losing money lately, including a recent instance where she claimed she lost a check for over $600. No significant personality changes were reported.  Difficulties with emotion regulation: Denied. Difficulties with receptive language: Denied. Difficulties with word finding: Endorsed. Decreased visuoperceptual ability: Endorsed. Her daughter noted that she does have an increased tendency to bump into things in her environment.   Difficulties completing ADLs: Endorsed. Her daughter has fully taken over all medication, financial, and bill paying responsibilities. The former were due to errors being made  and have  since been managed in the form of receiving pill packs. The latter were due to errors and missed payments, as well as concerns for her being scammed as stated above. While Ms. Tordoff has continued to drive locally, her daughter stated that she performs 90% of the driving. Her daughter also noted her mother recently admitting to another physician that she does not always feel safe while driving.   Additional Medical History: History of traumatic brain injury/concussion: Denied. History of stroke: Denied. History of seizure activity: Denied. History of known exposure to toxins: Denied. Symptoms of chronic pain: Endorsed. She reported arthritis primarily impacting her thumbs and hands more broadly. Her daughter added that her mother also experiences arthritic pain in her spine and hips and that overall pain levels are likely more than what Ms. Zinno has described.  Experience of frequent headaches/migraines: Denied outside of occasional sinus headaches.  Frequent instances of dizziness/vertigo: Denied.  Sensory changes: She wears glasses and hearing aids with benefit. She also described a longstanding diminished sense of smell. The cause for this was unknown. No taste related difficulties were reported.  Balance/coordination difficulties: Endorsed. She described her balance as "iffy." Her daughter noted that she has been using a walker for the past month and a cane for several months prior to that due to increased falling behaviors. They recently moved to a new home to eliminate some steps that were the frequent cause of these falls. One side of the body was not said to be worse than the other.  Other motor difficulties: Denied.  Sleep History: Estimated hours obtained each night: Unclear. She described her sleep as "iffy," noting that she commonly goes to sleep late and wakes up frequently throughout the night. As such, a numerical estimation was challenging to obtain.  Difficulties falling  asleep: Endorsed. Difficulties staying asleep: Endorsed. Feels rested and refreshed upon awakening: Endorsed "most of the time."  History of snoring: Endorsed. History of waking up gasping for air: Denied. Witnessed breath cessation while asleep: Endorsed. Her daughter noted several instances where she has checked on her mother while she was asleep due to her perception that her mother had stopped breathing while asleep. They denied prior sleep apnea concerns or any formal testing in the past.   History of vivid dreaming: Denied. Excessive movement while asleep: Denied. Instances of acting out her dreams: Denied.  Psychiatric/Behavioral Health History: Depression: She described her current mood as fairly negative, attributing this to stress surrounding a recent move to a new home. She denied current or historical depressive symptoms. Current or remote suicidal ideation, intent, or plan was also denied.  Anxiety: Endorsed. She reported generalized symptoms of anxious distress experienced commonly.  Mania: Denied. Trauma History: Denied. Visual/auditory hallucinations: Denied. Delusional thoughts: Denied.  Tobacco: Endorsed. She reported vaping daily where a single cartridge will last her around a month.  Alcohol: She denied current alcohol consumption as well as a history of problematic alcohol abuse or dependence.  Recreational drugs: Denied.  Family History: Problem Relation Age of Onset   Stroke Mother    Hypertension Mother    Hearing loss Mother    Cancer Mother        d. 71 "where the stomach meets the intestine" "apple-core"   Heart disease Mother    Varicose Veins Mother    Dementia Mother    Stroke Father    Heart disease Father    Hearing loss Father    Lung cancer Brother 55  smoker   Hearing loss Brother    Depression Maternal Grandmother    Diabetes Maternal Grandfather    Colon cancer Paternal Grandmother        dx in her 74s   Hypertension Daughter     Heart disease Daughter    Hearing loss Daughter    Diabetes Daughter    Depression Daughter    Vaginal cancer Daughter        dx <53   Uterine cancer Daughter        dx <53   Breast cancer Daughter        dx <53   Drug abuse Daughter    Kidney disease Daughter    Miscarriages / [place] Daughter    Breast cancer Maternal Aunt        unknown age of diagnosis   Breast cancer Maternal Aunt        unknown age of diagnosis   Breast cancer Cousin        unknown age of diagnosis (maternal first cousin)   Breast cancer Cousin        unknown age of diagnosis (maternal first cousin)   23 / [place] Granddaughter    Hearing loss Granddaughter    Drug abuse Granddaughter    Depression Granddaughter    This information was confirmed by Ms. Friedly.  Academic/Vocational History: Highest level of educational attainment: 12 years. She graduated from high school and described herself as an average (A/B/C) student in academic settings. She highlighted several courses which she had greater difficulty with, including history, geography, geometry, and chemistry.  History of developmental delay: Denied. History of grade repetition: Denied. Enrollment in special education courses: Denied. History of LD/ADHD: Denied.  Employment: Retired. 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   Evaluation Results:   Behavioral Observations: Ms. Tabor was accompanied by her daughter, arrived to her appointment on time, and was appropriately dressed and groomed. She appeared alert and oriented. She ambulated with the assistance of a rolling walker. She maneuvered this device well and no frank instability was observed. Gross motor functioning appeared intact upon informal observation and no abnormal movements (e.g., tremors) were noted. Her affect was generally relaxed and positive, but did range appropriately given the subject being discussed during the clinical interview.  There were a few times where she appeared to get irritated towards her daughter when she would provide a different account of difficulties. Spontaneous speech was fluent and word finding difficulties were not observed during the clinical interview. Thought processes were coherent, organized, and normal in content. Insight into her cognitive difficulties appeared adequate.   During testing, sustained attention was appropriate. Task engagement was adequate and she persisted when challenged. Overall, Ms. Coy was cooperative with the clinical interview and subsequent testing procedures.   Adequacy of Effort: The validity of neuropsychological testing is limited by the extent to which the individual being tested may be assumed to have exerted adequate effort during testing. Ms. Holderby expressed her intention to perform to the best of her abilities and exhibited adequate task engagement and persistence. Scores across stand-alone and embedded performance validity measures were within expectation. As such, the results of the current evaluation are believed to be a valid representation of Ms. Laatsch's current cognitive functioning.  Test Results: Ms. Sousa was fully oriented at the time of the current evaluation.  Intellectual abilities based upon educational and vocational attainment were estimated to be in the average range. Premorbid abilities were estimated to  be within the average range based upon a single-word reading test.   Processing speed was average to above average. Basic attention was average to above average. More complex attention (e.g., working memory) was also average to above average. Executive functioning was variable, ranging from the exceptionally low to average normative ranges. She did perform in the average range across a task assessing safety and judgment.  Assessed receptive language abilities were above average. Likewise, Ms. Sinn did not exhibit any difficulties  comprehending task instructions and answered all questions asked of her appropriately. Assessed expressive language (e.g., verbal fluency and confrontation naming) was average to well above average.     Assessed visuospatial/visuoconstructional abilities were average to well above average.    Learning (i.e., encoding) of novel verbal information was below average to average. Spontaneous delayed recall (i.e., retrieval) of previously learned information was average to above average. Retention rates were 88% across a story learning task, 57% across a list learning task, and 75% across a figure drawing task. Performance across recognition tasks was average, suggesting evidence for information consolidation.   Results of emotional screening instruments suggested that recent symptoms of generalized anxiety were in the minimal range, while symptoms of depression were within normal limits. A screening instrument assessing recent sleep quality suggested the presence of mild sleep dysfunction.  Tables of Scores:   Note: This summary of test scores accompanies the interpretive report and should not be considered in isolation without reference to the appropriate sections in the text. Descriptors are based on appropriate normative data and may be adjusted based on clinical judgment. Terms such as "Within Normal Limits" and "Outside Normal Limits" are used when a more specific description of the test score cannot be determined.       Percentile - Normative Descriptor > 98 - Exceptionally High 91-97 - Well Above Average 75-90 - Above Average 25-74 - Average 9-24 - Below Average 2-8 - Well Below Average < 2 - Exceptionally Low       Orientation:      Raw Score Percentile   NAB Orientation, Form 1 29/29 --- ---       Cognitive Screening:      Raw Score Percentile   SLUMS: 28/30 --- ---       RBANS, Form A: Standard Score/ Scaled Score Percentile   Total Score 104 61 Average  Immediate Memory 85 16  Below Average    List Learning 7 16 Below Average    Story Memory 8 25 Average  Visuospatial/Constructional 121 92 Well Above Average    Figure Copy 14 91 Well Above Average    Line Orientation 18/20 51-75 Average  Language 108 70 Average    Picture Naming 10/10 51-75 Average    Semantic Fluency 12 75 Above Average  Attention 103 58 Average    Digit Span 10 50 Average    Coding 11 63 Average  Delayed Memory 103 58 Average    List Recall 4/10 26-50 Average    List Recognition 19/20 26-50 Average    Story Recall 8 25 Average    Story Recognition 12/12 69+ Average    Figure Recall 12 75 Above Average    Figure Recognition 6/8 30-52 Average        Intellectual Functioning:      Standard Score Percentile   Test of Premorbid Functioning: 101 53 Average       Attention/Executive Function:     Trail Making Test (TMT): Raw Score (T Score) Percentile  Part A 36 secs.,  0 errors (52) 58 Average    Part B 100 secs.,  1 error (52) 58 Average         Scaled Score Percentile   WAIS-IV Digit Span: 11 63 Average    Forward 12 75 Above Average    Backward 8 25 Average    Sequencing 13 84 Above Average        Scaled Score Percentile   WAIS-IV Similarities: 9 37 Average       D-KEFS Color-Word Interference Test: Raw Score (Scaled Score) Percentile     Color Naming 29 secs. (12) 75 Above Average    Word Reading 22 secs. (12) 75 Above Average    Inhibition 74 secs. (10) 50 Average      Total Errors 22 errors (1) <1 Exceptionally Low    Inhibition/Switching 107 secs. (6) 9 Below Average      Total Errors 17 errors (1) <1 Exceptionally Low       D-KEFS Verbal Fluency Test: Raw Score (Scaled Score) Percentile     Letter Total Correct 47 (14) 91 Well Above Average    Category Total Correct 32 (10) 50 Average    Category Switching Total Correct 7 (4) 2 Well Below Average    Category Switching Accuracy 5 (4) 2 Well Below Average      Total Set Loss Errors 1 (11) 63 Average      Total  Repetition Errors 3 (10) 50 Average       NAB Executive Functions Module, Form 1: T Score Percentile     Judgment 60 84 Above Average       Language:     Verbal Fluency Test: Raw Score (T Score) Percentile     Phonemic Fluency (FAS) 47 (58) 79 Above Average    Animal Fluency 22 (59) 82 Above Average        NAB Language Module, Form 1: T Score Percentile     Auditory Comprehension 58 79 Above Average    Naming 31/31 (61) 86 Above Average       Visuospatial/Visuoconstruction:      Raw Score Percentile   Clock Drawing: 10/10 --- Within Normal Limits        Scaled Score Percentile   WAIS-IV Block Design: 11 63 Average       Mood and Personality:      Raw Score Percentile   Geriatric Depression Scale: 9 --- Within Normal Limits  Geriatric Anxiety Scale: 9 --- Minimal    Somatic 5 --- Minimal    Cognitive 2 --- Minimal    Affective 2 --- Minimal       Additional Questionnaires:      Raw Score Percentile   PROMIS Sleep Disturbance Questionnaire: 25 --- Mild   Informed Consent and Coding/Compliance:   The current evaluation represents a clinical evaluation for the purposes previously outlined by the referral source and is in no way reflective of a forensic evaluation.   Ms. Riemersma was provided with a verbal description of the nature and purpose of the present neuropsychological evaluation. Also reviewed were the foreseeable risks and/or discomforts and benefits of the procedure, limits of confidentiality, and mandatory reporting requirements of this provider. The patient was given the opportunity to ask questions and receive answers about the evaluation. Oral consent to participate was provided by the patient.   This evaluation was conducted by Christia Reading, Ph.D., ABPP-CN, board certified clinical neuropsychologist. Ms. Zimmerly completed a clinical interview with Dr.  Parvin Stetzer, billed as one unit [phone], and 150 minutes of cognitive testing and scoring, billed as one unit [phone] and  four additional units [id number]. Psychometrist Milana Kidney, B.S., assisted Dr. Melvyn Novas with test administration and scoring procedures. As a separate and discrete service, Dr. Melvyn Novas spent a total of 160 minutes in interpretation and report writing billed as one unit [phone] and two units [id number].

## 2022-03-31 NOTE — Progress Notes (Signed)
   Psychometrician Note   Cognitive testing was administered to [name] by Milana Kidney, B.S. (psychometrist) under the supervision of Dr. Christia Reading, Ph.D., licensed psychologist on 03/31/2022. Cassandra Fowler did not appear overtly distressed by the testing session per behavioral observation or responses across self-report questionnaires. Rest breaks were offered.    The battery of tests administered was selected by Dr. Christia Reading, Ph.D. with consideration to Cassandra Fowler's current level of functioning, the nature of her symptoms, emotional and behavioral responses during interview, level of literacy, observed level of motivation/effort, and the nature of the referral question. This battery was communicated to the psychometrist. Communication between Dr. Christia Reading, Ph.D. and the psychometrist was ongoing throughout the evaluation and Dr. Christia Reading, Ph.D. was immediately accessible at all times. Dr. Christia Reading, Ph.D. provided supervision to the psychometrist on the date of this service to the extent necessary to assure the quality of all services provided.    Cassandra Fowler will return within approximately 1-2 weeks for an interactive feedback session with Dr. Melvyn Novas at which time her test performances, clinical impressions, and treatment recommendations will be reviewed in detail. Cassandra Fowler understands she can contact our office should she require our assistance before this time.  A total of 150 minutes of billable time were spent face-to-face with Cassandra Fowler by the psychometrist. This includes both test administration and scoring time. Billing for these services is reflected in the clinical report generated by Dr. Christia Reading, Ph.D.  This note reflects time spent with the psychometrician and does not include test scores or any clinical interpretations made by Dr. Melvyn Novas. The full report will follow in a separate note.

## 2022-04-15 ENCOUNTER — Ambulatory Visit
Admission: RE | Admit: 2022-04-15 | Discharge: 2022-04-15 | Disposition: A | Discharge status: Home / Self Care | Payer: 59 | Source: Ambulatory Visit | Attending: Physician Assistant | Admitting: Physician Assistant

## 2022-04-15 ENCOUNTER — Ambulatory Visit (INDEPENDENT_AMBULATORY_CARE_PROVIDER_SITE_OTHER): Payer: 59 | Admitting: Psychology

## 2022-04-15 DIAGNOSIS — F411 Generalized anxiety disorder: Secondary | ICD-10-CM | POA: Diagnosis not present

## 2022-04-15 DIAGNOSIS — R4189 Other symptoms and signs involving cognitive functions and awareness: Secondary | ICD-10-CM | POA: Insufficient documentation

## 2022-04-15 DIAGNOSIS — I6782 Cerebral ischemia: Secondary | ICD-10-CM | POA: Diagnosis not present

## 2022-04-15 DIAGNOSIS — G4720 Circadian rhythm sleep disorder, unspecified type: Secondary | ICD-10-CM | POA: Diagnosis not present

## 2022-04-15 DIAGNOSIS — R413 Other amnesia: Secondary | ICD-10-CM | POA: Diagnosis not present

## 2022-04-15 DIAGNOSIS — Z853 Personal history of malignant neoplasm of breast: Secondary | ICD-10-CM | POA: Diagnosis not present

## 2022-04-15 NOTE — Progress Notes (Signed)
   Neuropsychology Feedback Session Cassandra Fowler. [facility] [place] Department of Neurology  Reason for Referral:   Cassandra Fowler is a 77 y.o. left-handed Caucasian female referred by Marlowe Kays, PA-C, to characterize her current cognitive functioning and assist with diagnostic clarity and treatment planning in the context of subjective cognitive decline.   Feedback:   [name] completed a comprehensive neuropsychological evaluation on 03/31/2022. Please refer to that encounter for the full report and recommendations. Briefly, results suggested neuropsychological functioning generally within normal limits relative to age-matched peers. Some performance variability was exhibited across executive functioning, particularly surrounding cognitive flexibility and response inhibition. Performances across tasks assessing verbal reasoning and safety/judgment were appropriate. Performances were also appropriate across processing speed, attention/concentration, receptive and expressive language, visuospatial abilities, and all aspects of learning and memory.   [name] was accompanied by her daughter during the current feedback session. Content of the current session focused on the results of her neuropsychological evaluation. [name] was given the opportunity to ask questions and her questions were answered. She was encouraged to reach out should additional questions arise. A copy of her report had been mailed after her report was finalized.      Greater than 31 minutes were spent preparing for, conducting, and documenting the current feedback session with [name], billed as one unit [id number].

## 2022-04-22 DIAGNOSIS — I10 Essential (primary) hypertension: Secondary | ICD-10-CM | POA: Diagnosis not present

## 2022-04-22 DIAGNOSIS — M62838 Other muscle spasm: Secondary | ICD-10-CM | POA: Diagnosis not present

## 2022-04-22 DIAGNOSIS — E785 Hyperlipidemia, unspecified: Secondary | ICD-10-CM | POA: Diagnosis not present

## 2022-04-22 DIAGNOSIS — Z79899 Other long term (current) drug therapy: Secondary | ICD-10-CM | POA: Diagnosis not present

## 2022-04-22 DIAGNOSIS — R7303 Prediabetes: Secondary | ICD-10-CM | POA: Diagnosis not present

## 2022-04-22 DIAGNOSIS — K219 Gastro-esophageal reflux disease without esophagitis: Secondary | ICD-10-CM | POA: Diagnosis not present

## 2022-04-25 ENCOUNTER — Encounter: Payer: Self-pay | Admitting: Internal Medicine

## 2022-04-25 ENCOUNTER — Ambulatory Visit: Payer: 59 | Attending: Internal Medicine | Admitting: Internal Medicine

## 2022-04-25 ENCOUNTER — Ambulatory Visit: Payer: 59 | Admitting: Internal Medicine

## 2022-04-25 VITALS — BP 126/78 | HR 68 | Ht 58.5 in | Wt 162.0 lb

## 2022-04-25 DIAGNOSIS — R0609 Other forms of dyspnea: Secondary | ICD-10-CM

## 2022-04-25 DIAGNOSIS — I10 Essential (primary) hypertension: Secondary | ICD-10-CM | POA: Diagnosis not present

## 2022-04-25 DIAGNOSIS — R0683 Snoring: Secondary | ICD-10-CM

## 2022-04-25 DIAGNOSIS — R400 Somnolence: Secondary | ICD-10-CM | POA: Diagnosis not present

## 2022-04-25 DIAGNOSIS — R5383 Other fatigue: Secondary | ICD-10-CM

## 2022-04-25 DIAGNOSIS — Z7289 Other problems related to lifestyle: Secondary | ICD-10-CM | POA: Diagnosis not present

## 2022-04-25 NOTE — Patient Instructions (Addendum)
Medication Instructions:  Your physician recommends that you continue on your current medications as directed. Please refer to the Current Medication list given to you today.  Labwork: none  Testing/Procedures: Your physician has requested that you have an echocardiogram. Echocardiography is a painless test that uses sound waves to create images of your heart. It provides your doctor with information about the size and shape of your heart and how well your heart's chambers and valves are working. This procedure takes approximately one hour. There are no restrictions for this procedure. Please do NOT wear cologne, perfume, aftershave, or lotions (deodorant is allowed). Please arrive 15 minutes prior to your appointment time. Your physician has recommended that you have a sleep study. This test records several body functions during sleep, including: brain activity, eye movement, oxygen and carbon dioxide blood levels, heart rate and rhythm, breathing rate and rhythm, the flow of air through your mouth and nose, snoring, body muscle movements, and chest and belly movement.   Follow-Up: Your physician recommends that you schedule a follow-up appointment in: 1 year. You will receive a reminder call in the mail in about 10 months reminding you to call and schedule your appointment. If you don't receive this call, please contact our office.  Any Other Special Instructions Will Be Listed Below (If Applicable).  If you need a refill on your cardiac medications before your next appointment, please call your pharmacy.

## 2022-04-25 NOTE — Progress Notes (Signed)
Cardiology Office Note  Date: 04/25/2022   ID: Ludmila Ebarb, DOB 09/22/1945, MRN [id number]  PCP:  Wilmon Pali, FNP  Cardiologist:  Marjo Bicker, MD Electrophysiologist:  None   Reason for Office Visit: Evaluation of pulsating neck veins at the request of Thomasena Edis, FNP   History of Present Illness: Cassandra Fowler is a 77 y.o. female known to have HTN, prediabetes, HLD, nicotine abuse (vapes).  To cardiology clinic for evaluation of pulsating neck veins. Accompanied by daughter.  Patient lives with her daughter and has a big backyard where she can walk. Currently she walks with a walker and can perform her ADLs with no issues. She can also perform household chores with symptoms of DOE.  Sometimes has leg swelling but not an issue.  Denies dizziness, palpitations and syncope. Currently vapes, 2% nicotine product.  She reports family history of heart disease in all her related members.  She also has symptoms of OSA, was never tested.  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 The report date is 05/09/2020     The Multi-Cancer + RNA Panel offered by [facility] includes sequencing and/or deletion/duplication analysis of the following 84 genes:  AIP*, ALK, APC*,  ATM*, AXIN2*,    GERD (gastroesophageal reflux disease)    GIST (gastrointestinal stroma tumor), malignant, colon    s/p resection of 2 tumors in June 2021 [facility] [place] resection of 2 tumors in June 2021 at [facility] in [place]   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   Pre-diabetes    Pulmonary nodule, left 11/25/2020   6 mm LUL on CT 05/20/2020   Sclerosing adenosis of right breast    Tobacco use 08/05/2021    Past Surgical History:  Procedure Laterality Date   BREAST BIOPSY Right 09/06/2019   [facility]; evaluation of suspicious calcification of right breast; pathology with atypical ductal hyperplasia, focal cystic dilation of ducts with columnar cell change of epithelium, presence of microcalcifications.   BREAST BIOPSY Right 11/06/2020   Procedure: BREAST BIOPSY;  Surgeon: Franky Macho, MD;  Location: [facility];  Service: General;  Laterality: Right;   BREAST LUMPECTOMY WITH RADIOFREQUENCY TAG IDENTIFICATION Right 11/06/2020   Procedure: BREAST LUMPECTOMY WITH RADIOFREQUENCY TAG IDENTIFICATION;  Surgeon: Franky Macho, MD;  Location: [facility];  Service: General;  Laterality: Right;   CATARACT EXTRACTION Right 03/28/2017   [facility]   CATARACT EXTRACTION Left 03/14/2017   [facility]   COLONOSCOPY  01/28/2013   Dr. Stacey Drain, [place], [place]; Hyperplastic polyp x2, sessile serrated adenoma x2, tubular adenoma with low-grade dysplasia x1.  Recommend repeat colonoscopy in 3 years.   COLONOSCOPY WITH PROPOFOL N/A 04/02/2020   Surgeon: Corbin Ade, MD; Diverticulosis in  the sigmoid and descending colon, cecal AVM, three 5-8 millimeters polyps in the rectum, mid rectum, and ascending colon resected and retrieved.  Pathology with 2 tubular adenomas, 1 hyperplastic polyp.  Recommended repeat colonoscopy in 5 years if health permits.   ESOPHAGOGASTRODUODENOSCOPY  04/24/2019   [place]; 2.4 cm gastric  submucosal mass in the fundus consistent with GIST.  FNA consistent with GIST.   ESOPHAGOGASTRODUODENOSCOPY  01/28/2013   Dr. Ihor Austin Devers in [place], [place]; normal esophagus, hernia at GE junction, normal examined stomach, normal examined duodenum.  Does not appear dilation was performed.   ESOPHAGOGASTRODUODENOSCOPY (EGD) WITH PROPOFOL N/A 04/02/2020   Surgeon: Corbin Ade, MD;  Erosive reflux esophagitis, somewhat dilated distal esophagus s/p 60 French Maloney dilation, stigmata of prior gastric surgery, no evidence of recurrent/persisting GIST, normal examined duodenum.    GIST tumor resection  06/19/2019   [facility], robotic assisted laparoscopic partial gastrectomy x2.  Pathology consistent with GIST   HELLER MYOTOMY  06/19/2019   [facility]; esophageal myotomy with hiatal hernia repair and dor fundoplication   left hand surgery     MALONEY DILATION N/A 04/02/2020   Procedure: Elease Hashimoto DILATION;  Surgeon: Corbin Ade, MD;  Location: [facility];  Service: Endoscopy;  Laterality: N/A;   POLYPECTOMY  04/02/2020   Procedure: POLYPECTOMY;  Surgeon: Corbin Ade, MD;  Location: [facility];  Service: Endoscopy;;   tubal ligation     UMBILICAL HERNIA REPAIR     1970s   UPPER ESOPHAGEAL ENDOSCOPIC ULTRASOUND (EUS)  04/24/2019   [facility]; endoscopic findings: Tortuous esophagus s/p balloon dilation to 18 mm with no effect s/p biopsy, submucosal mass in the fundus, normal duodenum; endosonographic findings: 2.4 cm x 2.4 cm submucosal mass in the fundus s/p FNA, cholelithiasis, no hepatic, CBD, or pancreatic abnormalities.  Pathology: GIST, hepatic parenchyma with mild steato-fibrosis, reflux esophagitis   YAG laser posterior capsulotomy Left 08/30/2018   [facility], left eye.    Current Outpatient Medications  Medication Sig Dispense Refill   acetaminophen (TYLENOL) 500 MG tablet Take 500 mg by mouth every  6 (six) hours as needed for moderate pain.     ascorbic acid (VITAMIN C) 1000 MG tablet Take 1 tablet by mouth daily.     Aspirin-Caffeine (BAYER BACK & BODY PO) Take by mouth daily as needed.     Calcium Carbonate-Vitamin D3 600-400 MG-UNIT TABS Take 1 tablet by mouth daily.     Cholecalciferol 50 MCG (2000 UT) TABS Take by mouth daily.     losartan (COZAAR) 50 MG tablet Take 50 mg by mouth daily.     pantoprazole (PROTONIX) 40 MG tablet Take 1 tablet (40 mg total) by mouth daily. 90 tablet 3   Probiotic Product (PHILLIPS COLON HEALTH) CAPS Take 1 capsule by mouth daily.     rosuvastatin (CRESTOR) 10 MG tablet Take 10 mg by mouth daily.     tamoxifen (NOLVADEX) 20 MG tablet Take 1 tablet by mouth once daily. 90 tablet 0   tizanidine (ZANAFLEX) 2 MG capsule Take 2 mg by mouth as needed for muscle spasms.     No current facility-administered medications for this visit.   Allergies:  Penicillins, Somatropin, Tilactase, Tape, Indomethacin, Tolectin [tolmetin], Sulfa antibiotics, and Wound dressing adhesive   Social History: The patient  reports that she has been smoking e-cigarettes. She has never used smokeless tobacco. She reports that she does not currently use alcohol. She reports that she does not  use drugs.   Family History: The patient's family history includes Breast cancer in her cousin, cousin, daughter, maternal aunt, and maternal aunt; Cancer in her mother; Colon cancer in her paternal grandmother; Dementia in her mother; Depression in her daughter, granddaughter, and maternal grandmother; Diabetes in her daughter and maternal grandfather; Drug abuse in her daughter and granddaughter; Hearing loss in her brother, daughter, father, granddaughter, and mother; Heart disease in her daughter, father, and mother; Hypertension in her daughter and mother; Kidney disease in her daughter; Lung cancer (age of onset: 32) in her brother; Miscarriages / Stillbirths in her daughter and granddaughter;  Stroke in her father and mother; Uterine cancer in her daughter; Vaginal cancer in her daughter; Varicose Veins in her mother.   ROS:  Please see the history of present illness. Otherwise, complete review of systems is positive for none.  All other systems are reviewed and negative.   Physical Exam: VS:  BP 126/78   Pulse 68   Ht 4' 10.5" (1.486 m)   Wt 162 lb (73.5 kg)   SpO2 96%   BMI 33.28 kg/m , BMI Body mass index is 33.28 kg/m.  Wt Readings from Last 3 Encounters:  04/25/22 162 lb (73.5 kg)  03/25/22 158 lb (71.7 kg)  11/22/21 146 lb 3.2 oz (66.3 kg)    General: Patient appears comfortable at rest. HEENT: Conjunctiva and lids normal, oropharynx clear with moist mucosa. Neck: Supple, no elevated JVP or carotid bruits, no thyromegaly. Lungs: Clear to auscultation, nonlabored breathing at rest. Cardiac: Regular rate and rhythm, no S3 or significant systolic murmur, no pericardial rub. Abdomen: Soft, nontender, no hepatomegaly, bowel sounds present, no guarding or rebound. Extremities: No pitting edema, distal pulses 2+. Skin: Warm and dry. Musculoskeletal: No kyphosis. Neuropsychiatric: Alert and oriented x3, affect grossly appropriate.  ECG: NSR  Recent Labwork: 10/28/2021: ALT 17; AST 15; BUN 10; Creatinine, Ser 0.65; Hemoglobin 12.8; Platelets 239; Potassium 3.8; Sodium 141 03/25/2022: TSH 5.28  No results found for: "CHOL", "TRIG", "HDL", "CHOLHDL", "VLDL", "LDLCALC", "LDLDIRECT"  Other Studies Reviewed Today:   Assessment and Plan: Patient is a 77 year old F known to have HTN, prediabetes, HLD was referred to cardiology clinic for evaluation of pulsating neck veins.  # DOE: Patient has DOE with household chores but I think this could be from deconditioning. Her prior echocardiogram from 2013 showed normal LVEF and no valve abnormalities.  Physical examination today showed no murmur. # OSA: STOP-BANG score is more than 3, patient was never tested for OSA but has all  symptoms. She cannot do home sleep study due to life alert. Will place pulm referral for OSA evaluation. # HTN: Continue losartan 50 mg once daily, HTN per PCP # HLD: Continue rosuvastatin 10 mg nightly, HLD per PCP # Vaping disorder: Vaping cessation counseled, patient will think about it. Smoking cessation instruction/counseling given:    I have spent a total of 45 minutes with patient reviewing chart, EKGs, labs and examining patient as well as establishing an assessment and plan that was discussed with the patient.  > 50% of time was spent in direct patient care.     Medication Adjustments/Labs and Tests Ordered: Current medicines are reviewed at length with the patient today.  Concerns regarding medicines are outlined above.   Tests Ordered: Orders Placed This Encounter  Procedures   EKG 12-Lead   ECHOCARDIOGRAM COMPLETE   Split night study    Medication Changes: No orders of the defined types were placed in this encounter.  Disposition:  Follow up  1 year  Signed, Arietta Eisenstein Verne Spurr, MD, 04/25/2022 11:34 AM    [facility] [address] [place], [place] [address]

## 2022-05-02 ENCOUNTER — Ambulatory Visit: Payer: 59 | Admitting: Cardiology

## 2022-05-19 ENCOUNTER — Inpatient Hospital Stay: Payer: 59 | Attending: Hematology

## 2022-05-19 DIAGNOSIS — F17210 Nicotine dependence, cigarettes, uncomplicated: Secondary | ICD-10-CM | POA: Diagnosis not present

## 2022-05-19 DIAGNOSIS — Z801 Family history of malignant neoplasm of trachea, bronchus and lung: Secondary | ICD-10-CM | POA: Insufficient documentation

## 2022-05-19 DIAGNOSIS — Z8049 Family history of malignant neoplasm of other genital organs: Secondary | ICD-10-CM | POA: Insufficient documentation

## 2022-05-19 DIAGNOSIS — Z79810 Long term (current) use of selective estrogen receptor modulators (SERMs): Secondary | ICD-10-CM | POA: Diagnosis not present

## 2022-05-19 DIAGNOSIS — Z800 Family history of malignant neoplasm of digestive organs: Secondary | ICD-10-CM | POA: Insufficient documentation

## 2022-05-19 DIAGNOSIS — Z803 Family history of malignant neoplasm of breast: Secondary | ICD-10-CM | POA: Diagnosis not present

## 2022-05-19 DIAGNOSIS — C49A4 Gastrointestinal stromal tumor of large intestine: Secondary | ICD-10-CM

## 2022-05-19 DIAGNOSIS — Z8509 Personal history of malignant neoplasm of other digestive organs: Secondary | ICD-10-CM | POA: Diagnosis not present

## 2022-05-19 DIAGNOSIS — D0511 Intraductal carcinoma in situ of right breast: Secondary | ICD-10-CM | POA: Diagnosis not present

## 2022-05-19 DIAGNOSIS — M8580 Other specified disorders of bone density and structure, unspecified site: Secondary | ICD-10-CM | POA: Diagnosis not present

## 2022-05-19 LAB — COMPREHENSIVE METABOLIC PANEL
ALT: 16 U/L (ref 0–44)
AST: 16 U/L (ref 15–41)
Albumin: 3.8 g/dL (ref 3.5–5.0)
Alkaline Phosphatase: 47 U/L (ref 38–126)
Anion gap: 7 (ref 5–15)
BUN: 15 mg/dL (ref 8–23)
CO2: 24 mmol/L (ref 22–32)
Calcium: 8.8 mg/dL — ABNORMAL LOW (ref 8.9–10.3)
Chloride: 104 mmol/L (ref 98–111)
Creatinine, Ser: 0.68 mg/dL (ref 0.44–1.00)
GFR, Estimated: >60 mL/min (ref >60)
Glucose, Bld: 176 mg/dL — ABNORMAL HIGH (ref 70–99)
Potassium: 3.6 mmol/L (ref 3.5–5.1)
Sodium: 135 mmol/L (ref 135–145)
Total Bilirubin: 1.2 mg/dL (ref 0.3–1.2)
Total Protein: 6.6 g/dL (ref 6.5–8.1)

## 2022-05-19 LAB — CBC WITH DIFFERENTIAL/PLATELET
Abs Immature Granulocytes: 0.04 10*3/uL (ref 0.00–0.07)
Basophils Absolute: 0 10*3/uL (ref 0.0–0.1)
Basophils Relative: 0 %
Eosinophils Absolute: 0.3 10*3/uL (ref 0.0–0.5)
Eosinophils Relative: 3 %
HCT: 39 % (ref 36.0–46.0)
Hemoglobin: 13.1 g/dL (ref 12.0–15.0)
Immature Granulocytes: 0 %
Lymphocytes Relative: 18 %
Lymphs Abs: 1.9 10*3/uL (ref 0.7–4.0)
MCH: 31.3 pg (ref 26.0–34.0)
MCHC: 33.6 g/dL (ref 30.0–36.0)
MCV: 93.3 fL (ref 80.0–100.0)
Monocytes Absolute: 0.4 10*3/uL (ref 0.1–1.0)
Monocytes Relative: 4 %
Neutro Abs: 7.5 10*3/uL (ref 1.7–7.7)
Neutrophils Relative %: 75 %
Platelets: 219 10*3/uL (ref 150–400)
RBC: 4.18 MIL/uL (ref 3.87–5.11)
RDW: 13.4 % (ref 11.5–15.5)
WBC: 10.2 10*3/uL (ref 4.0–10.5)
nRBC: 0 % (ref 0.0–0.2)

## 2022-05-19 LAB — VITAMIN D 25 HYDROXY (VIT D DEFICIENCY, FRACTURES): Vit D, 25-Hydroxy: 46.34 ng/mL (ref 30–100)

## 2022-05-23 ENCOUNTER — Ambulatory Visit: Payer: 59 | Attending: Internal Medicine

## 2022-05-23 DIAGNOSIS — R0609 Other forms of dyspnea: Secondary | ICD-10-CM

## 2022-05-23 LAB — ECHOCARDIOGRAM COMPLETE
AR max vel: 2.81 cm2
AV Area VTI: 2.73 cm2
AV Area mean vel: 2.5 cm2
AV Mean grad: 2.5 mmHg
AV Peak grad: 4.8 mmHg
Ao pk vel: 1.09 m/s
Area-P 1/2: 2.93 cm2
Calc EF: 64 %
MV M vel: 2.98 m/s
MV Peak grad: 35.4 mmHg
S' Lateral: 3.1 cm
Single Plane A2C EF: 53.4 %
Single Plane A4C EF: 70.7 %

## 2022-05-25 NOTE — Progress Notes (Signed)
[facility] [address], [place] [address]    Clinic Day:  05/26/2022  Referring physician: Waldon Reining, MD  Patient Care Team: Wilmon Pali, FNP as PCP - General (Family Medicine) Marjo Bicker, MD as PCP - Cardiology (Cardiology) Jena Gauss Gerrit Friends, MD as Consulting Physician (Gastroenterology) Doreatha Massed, MD as Medical Oncologist (Medical Oncology) Therese Sarah, RN as Oncology Nurse Navigator (Medical Oncology) Elwyn Reach (Neurology)   ASSESSMENT & PLAN:   Assessment: 1. Right breast DCIS: - Had initial biopsy on 09/06/2018 one of the right breast in [place] consistent with ADH. - On 11/06/2020, lumpectomy. - Pathology consistent with 1.5 cm DCIS, margins negative, intermediate grade, pTis P NX.  She did not receive radiation therapy.  Tamoxifen started on 04/12/2021.   2. Social/family history: - She is retired Lawyer.  Quit smoking 3 years ago, smoked 1 pack/day. - Mother had colon cancer, brother had lung cancer.  Maternal cousin had ovarian/cervical cancer.  Daughter had ovarian/cervical/vaginal cancer.  Maternal aunt had breast cancer.  3.  Multifocal gastric GIST tumors: -CT CAP on 05/30/2019 at [facility] in [place] shows 4 x 2 x 2 cm mass in fundus with no metastatic disease.  6 mm solitary pulmonary nodule in the left upper lobe. -On 06/19/2019-partial gastrectomy x2, hiatal hernia repair, esophageal myotomy - Pathology shows 2 gastrointestinal stromal tumors, smaller lesion measuring 0.7 cm and larger lesion measuring 4.7 cm, mitotic rate less than 5/71mm2 for both lesions, low-grade, very low risk, no lymph nodes found (PT 1 and PT 2 N0). -She was evaluated by Dr. Lesia Sago of medical oncology at [facility] and was started on adjuvant imatinib 400 mg daily. - She appears to be in the very low risk range with a 1.9% metastatic rate.  This prognostic assessment applies best to KIT negative or  PDGFR positive GIST's, whereas SDH deficient GIST's are more unpredictable.  Unsure why she was started on imatinib.  Was possible that she was part of clinical trial.    Plan: Right breast DCIS ER/PR positive: - She is tolerating tamoxifen very well. - Reviewed bilateral diagnostic mammogram from 12/29/2021: BI-RADS Category 2. - Labs from 05/19/2022: Normal chemistries and CBC. - Continue tamoxifen daily.  RTC 6 months for follow-up.  Will arrange for another mammogram in December.  2.  Multifocal gastric GIST tumors: - CT CAP in March 2023 with no evidence of recurrence or metastatic disease. - Based on very low risk disease and low percent metastatic rate, imatinib discontinued in October 2022. - Will plan on repeating CTAP next year.  3.  Osteopenia (DEXA scan on 11/27/2020 T score -1.9): - Vitamin D level is 46.  Continue vitamin D3 supplements. - Will plan to repeat bone density test prior to next visit in December.    Orders Placed This Encounter  Procedures   MM DIAG BREAST TOMO BILATERAL    Standing Status:   Future    Standing Expiration Date:   05/26/2023    Order Specific Question:   Reason for Exam (SYMPTOM  OR DIAGNOSIS REQUIRED)    Answer:   breast cancer monitoring    Order Specific Question:   Preferred imaging location?    Answer:   [facility]   DG Bone Density    Standing Status:   Future    Standing Expiration Date:   05/26/2023    Order Specific Question:   Reason for Exam (SYMPTOM  OR DIAGNOSIS REQUIRED)  Answer:   antiestrogen therapy    Order Specific Question:   Preferred imaging location?    Answer:   [facility]      I,Katie Daubenspeck,acting as a scribe for Doreatha Massed, MD.,have documented all relevant documentation on the behalf of Doreatha Massed, MD,as directed by  Doreatha Massed, MD while in the presence of Doreatha Massed, MD.   I, Doreatha Massed MD, have reviewed the above documentation for  accuracy and completeness, and I agree with the above.   Doreatha Massed, MD   5/16/20242:26 PM  CHIEF COMPLAINT:   Diagnosis: right breast DCIS and multifocal GIST   Cancer Staging  Breast neoplasm, Tis (DCIS), right Staging form: Breast, AJCC 8th Edition - Clinical stage from 11/13/2020: Stage 0 (cTis (DCIS), cN0, cM0, ER+, PR+, HER2: Not Assessed) - Unsigned    Prior Therapy: 1. Partial gastrectomy x2 on 06/19/19 2. Adjuvant imatinib through 10/2020 3. Lumpectomy on 11/06/20  Current Therapy:  tamoxifen   HISTORY OF PRESENT ILLNESS:   Oncology History  GIST (gastrointestinal stroma tumor), malignant, colon  01/17/2020 Initial Diagnosis   GIST (gastrointestinal stroma tumor), malignant, colon (HCC)   05/09/2020 Genetic Testing   Negative genetic testing:  No pathogenic variants detected on the [facility] Multi-Cancer + RNA panel. 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 The report date is 05/09/2020  The Multi-Cancer + RNA Panel offered by [facility] includes sequencing and/or deletion/duplication analysis of the following 84 genes:  AIP*, ALK, APC*, ATM*, AXIN2*, BAP1*, BARD1*, BLM*, BMPR1A*, BRCA1*, BRCA2*, BRIP1*, CASR, CDC73*, CDH1*, CDK4, CDKN1B*, CDKN1C*, CDKN2A, CEBPA, CHEK2*, CTNNA1*, DICER1*, DIS3L2*, EGFR, EPCAM, FH*, FLCN*, GATA2*, GPC3, GREM1, HOXB13, HRAS, KIT, MAX*, MEN1*, MET, MITF, MLH1*, MSH2*, MSH3*, MSH6*, MUTYH*, NBN*, NF1*, NF2*, NTHL1*, PALB2*, PDGFRA, PHOX2B, PMS2*, POLD1*, POLE*, POT1*, PRKAR1A*, PTCH1*, PTEN*, RAD50*, RAD51C*, RAD51D*, RB1*, RECQL4, RET, RUNX1*, SDHA*, SDHAF2*, SDHB*, SDHC*, SDHD*, SMAD4*, SMARCA4*, SMARCB1*, SMARCE1*, STK11*, SUFU*, TERC, TERT, TMEM127*, Tp53*, TSC1*, TSC2*, VHL*, WRN*, and WT1.  RNA analysis is performed for * genes.        INTERVAL HISTORY:   Jaciana is a 77 y.o. female presenting to clinic today for follow up of right breast DCIS and multifocal GIST. She was  last seen by me on 11/22/21.  Since her last visit, she underwent annual bilateral mammogram with left [place] on 12/29/21 showing: benign cyst in left breast at 2 o'clock; stable right breast lumpectomy site; no evidence of malignancy in bilateral breasts.  Today, she states that she is doing well overall. Her appetite level is at 100%. Her energy level is at 25%.  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 The report  date is 05/09/2020     The Multi-Cancer + RNA Panel offered by [facility] includes sequencing and/or deletion/duplication analysis of the following 84 genes:  AIP*, ALK, APC*, ATM*, AXIN2*,    GERD (gastroesophageal reflux disease)    GIST (gastrointestinal stroma tumor), malignant, colon    s/p resection of 2 tumors in June 2021 at [facility] [place] resection of 2 tumors in June 2021 at [facility] in [place]   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   Pre-diabetes    Pulmonary nodule, left 11/25/2020   6 mm LUL on CT 05/20/2020   Sclerosing adenosis of right breast    Tobacco use 08/05/2021    Surgical History: Past Surgical History:  Procedure Laterality Date   BREAST BIOPSY Right  09/06/2019   [facility]; evaluation of suspicious calcification of right breast; pathology with atypical ductal hyperplasia, focal cystic dilation of ducts with columnar cell change of epithelium, presence of microcalcifications.   BREAST BIOPSY Right 11/06/2020   Procedure: BREAST BIOPSY;  Surgeon: Franky Macho, MD;  Location: [facility];  Service: General;  Laterality: Right;   BREAST LUMPECTOMY WITH RADIOFREQUENCY TAG IDENTIFICATION Right 11/06/2020   Procedure: BREAST LUMPECTOMY WITH RADIOFREQUENCY TAG IDENTIFICATION;  Surgeon: Franky Macho, MD;  Location: [facility];  Service: General;  Laterality: Right;   CATARACT EXTRACTION Right 03/28/2017   [facility]   CATARACT EXTRACTION Left 03/14/2017   [facility]   COLONOSCOPY  01/28/2013   Dr. Stacey Drain, [place], [place]; Hyperplastic polyp x2, sessile serrated adenoma x2, tubular adenoma with low-grade dysplasia x1.  Recommend repeat colonoscopy in 3 years.   COLONOSCOPY WITH PROPOFOL N/A 04/02/2020   Surgeon: Corbin Ade, MD; Diverticulosis in the sigmoid and descending colon, cecal AVM, three 5-8 millimeters polyps in the rectum, mid rectum, and ascending colon resected and retrieved.  Pathology with 2 tubular adenomas, 1 hyperplastic polyp.  Recommended repeat colonoscopy in 5 years if health permits.   ESOPHAGOGASTRODUODENOSCOPY  04/24/2019   [place]; 2.4 cm gastric submucosal mass in the fundus consistent with GIST.  FNA consistent with GIST.   ESOPHAGOGASTRODUODENOSCOPY  01/28/2013   Dr. Ihor Austin Devers in [place], [place]; normal esophagus, hernia at GE junction, normal examined stomach, normal examined duodenum.  Does not appear dilation was performed.   ESOPHAGOGASTRODUODENOSCOPY (EGD) WITH PROPOFOL N/A 04/02/2020   Surgeon: Corbin Ade, MD;  Erosive reflux esophagitis, somewhat dilated distal esophagus s/p 60 French Maloney dilation, stigmata of prior gastric surgery, no evidence of  recurrent/persisting GIST, normal examined duodenum.    GIST tumor resection  06/19/2019   [facility], robotic assisted laparoscopic partial gastrectomy x2.  Pathology consistent with GIST   HELLER MYOTOMY  06/19/2019   [facility]; esophageal myotomy with hiatal hernia repair and dor fundoplication   left hand surgery     MALONEY DILATION N/A 04/02/2020   Procedure: Elease Hashimoto DILATION;  Surgeon: Corbin Ade, MD;  Location: [facility];  Service: Endoscopy;  Laterality: N/A;   POLYPECTOMY  04/02/2020   Procedure: POLYPECTOMY;  Surgeon: Corbin Ade, MD;  Location: [facility];  Service: Endoscopy;;   tubal ligation     UMBILICAL HERNIA REPAIR     1970s   UPPER ESOPHAGEAL ENDOSCOPIC ULTRASOUND (EUS)  04/24/2019   [facility]; endoscopic findings: Tortuous esophagus s/p balloon dilation to 18 mm with no effect s/p biopsy, submucosal mass in the fundus, normal duodenum; endosonographic findings: 2.4 cm x 2.4 cm submucosal mass in the fundus s/p FNA,  cholelithiasis, no hepatic, CBD, or pancreatic abnormalities.  Pathology: GIST, hepatic parenchyma with mild steato-fibrosis, reflux esophagitis   YAG laser posterior capsulotomy Left 08/30/2018   [facility], left eye.    Social History: Social History   Socioeconomic History   Marital status: Widowed    Spouse name: Not on file   Number of children: 1   Years of education: 12   Highest education level: High school graduate  Occupational History   Occupation: retired  Tobacco Use   Smoking status: Every Day    Types: E-cigarettes   Smokeless tobacco: Never   Tobacco comments:    Daily vaping; 1 cartridge lasts about a month  Vaping Use   Vaping Use: Never used  Substance and Sexual Activity   Alcohol use: Not Currently    Comment: glass of wine occ   Drug use: Never   Sexual activity: Not Currently    Birth control/protection: Post-menopausal, Abstinence   Other Topics Concern   Not on file  Social History Narrative   Left handed   One child   Lives with daughter   Drinks caffeine prn   Mobile home   Social Determinants of Health   Financial Resource Strain: Not on file  Food Insecurity: Not on file  Transportation Needs: No Transportation Needs (03/05/2020)   PRAPARE - Transportation    Lack of Transportation (Medical): No    Lack of Transportation (Non-Medical): No  Physical Activity: Inactive (03/05/2020)   Exercise Vital Sign    Days of Exercise per Week: 0 days    Minutes of Exercise per Session: 0 min  Stress: Not on file  Social Connections: Not on file  Intimate Partner Violence: Not At Risk (03/05/2020)   Humiliation, Afraid, Rape, and Kick questionnaire    Fear of Current or Ex-Partner: No    Emotionally Abused: No    Physically Abused: No    Sexually Abused: No    Family History: Family History  Problem Relation Age of Onset   Stroke Mother    Hypertension Mother    Hearing loss Mother    Cancer Mother        d. 52 "where the stomach meets the intestine" "apple-core"   Heart disease Mother    Varicose Veins Mother    Dementia Mother    Stroke Father    Heart disease Father    Hearing loss Father    Lung cancer Brother 49       smoker   Hearing loss Brother    Depression Maternal Grandmother    Diabetes Maternal Grandfather    Colon cancer Paternal Grandmother        dx in her 41s   Hypertension Daughter    Heart disease Daughter    Hearing loss Daughter    Diabetes Daughter    Depression Daughter    Vaginal cancer Daughter        dx <53   Uterine cancer Daughter        dx <53   Breast cancer Daughter        dx <53   Drug abuse Daughter    Kidney disease Daughter    Miscarriages / [place] Daughter    Breast cancer Maternal Aunt        unknown age of diagnosis   Breast cancer Maternal Aunt        unknown age of diagnosis   Breast cancer Cousin        unknown age of diagnosis (  maternal  first cousin)   Breast cancer Cousin        unknown age of diagnosis (maternal first cousin)   Miscarriages / [place] Granddaughter    Hearing loss Granddaughter    Drug abuse Granddaughter    Depression Granddaughter     Current Medications:  Current Outpatient Medications:    acetaminophen (TYLENOL) 500 MG tablet, Take 500 mg by mouth every 6 (six) hours as needed for moderate pain., Disp: , Rfl:    ascorbic acid (VITAMIN C) 1000 MG tablet, Take 1 tablet by mouth daily., Disp: , Rfl:    Aspirin-Caffeine (BAYER BACK & BODY PO), Take by mouth daily as needed., Disp: , Rfl:    Calcium Carbonate-Vitamin D3 600-400 MG-UNIT TABS, Take 1 tablet by mouth daily., Disp: , Rfl:    Cholecalciferol 50 MCG (2000 UT) TABS, Take by mouth daily., Disp: , Rfl:    losartan (COZAAR) 50 MG tablet, Take 50 mg by mouth daily., Disp: , Rfl:    pantoprazole (PROTONIX) 40 MG tablet, Take 1 tablet (40 mg total) by mouth daily., Disp: 90 tablet, Rfl: 3   Probiotic Product (PHILLIPS COLON HEALTH) CAPS, Take 1 capsule by mouth daily., Disp: , Rfl:    rosuvastatin (CRESTOR) 10 MG tablet, Take 10 mg by mouth daily., Disp: , Rfl:    tamoxifen (NOLVADEX) 20 MG tablet, Take 1 tablet by mouth once daily., Disp: 90 tablet, Rfl: 0   tizanidine (ZANAFLEX) 2 MG capsule, Take 2 mg by mouth as needed for muscle spasms., Disp: , Rfl:    Allergies: Allergies  Allergen Reactions   Penicillins Hives, Swelling and Other (See Comments)   Somatropin Anaphylaxis   Tilactase Anaphylaxis   Tape Rash   Indomethacin Hives   Tolectin [Tolmetin] Hives    Pt can not recall   Sulfa Antibiotics Hives and Rash   Wound Dressing Adhesive Rash    Adhesive tape     REVIEW OF SYSTEMS:   Review of Systems  Constitutional:  Negative for chills, fatigue and fever.  HENT:   Negative for lump/mass, mouth sores, nosebleeds, sore throat and trouble swallowing.   Eyes:  Negative for eye problems.  Respiratory:  Negative for cough and  shortness of breath.   Cardiovascular:  Negative for chest pain, leg swelling and palpitations.  Gastrointestinal:  Negative for abdominal pain, constipation, diarrhea, nausea and vomiting.  Genitourinary:  Positive for bladder incontinence. Negative for difficulty urinating, dysuria, frequency, hematuria and nocturia.   Musculoskeletal:  Negative for arthralgias, back pain, flank pain, myalgias and neck pain.  Skin:  Negative for itching and rash.  Neurological:  Negative for dizziness, headaches and numbness.  Hematological:  Does not bruise/bleed easily.  Psychiatric/Behavioral:  Negative for depression, sleep disturbance and suicidal ideas. The patient is not nervous/anxious.   All other systems reviewed and are negative.    VITALS:   Blood pressure 112/74, pulse 76, temperature 98.3 F (36.8 C), temperature source Oral, resp. rate 18, weight 159 lb (72.1 kg), SpO2 100 %.  Wt Readings from Last 3 Encounters:  05/26/22 159 lb (72.1 kg)  04/25/22 162 lb (73.5 kg)  03/25/22 158 lb (71.7 kg)    Body mass index is 32.67 kg/m.  Performance status (ECOG): 1 - Symptomatic but completely ambulatory  PHYSICAL EXAM:   Physical Exam Vitals and nursing note reviewed. Exam conducted with a chaperone present.  Constitutional:      Appearance: Normal appearance.  Cardiovascular:     Rate and Rhythm: Normal rate  and regular rhythm.     Pulses: Normal pulses.     Heart sounds: Normal heart sounds.  Pulmonary:     Effort: Pulmonary effort is normal.     Breath sounds: Normal breath sounds.  Abdominal:     Palpations: Abdomen is soft. There is no hepatomegaly, splenomegaly or mass.     Tenderness: There is no abdominal tenderness.  Musculoskeletal:     Right lower leg: No edema.     Left lower leg: No edema.  Lymphadenopathy:     Cervical: No cervical adenopathy.     Right cervical: No superficial, deep or posterior cervical adenopathy.    Left cervical: No superficial, deep or  posterior cervical adenopathy.     Upper Body:     Right upper body: No supraclavicular or axillary adenopathy.     Left upper body: No supraclavicular or axillary adenopathy.  Neurological:     General: No focal deficit present.     Mental Status: She is alert and oriented to person, place, and time.  Psychiatric:        Mood and Affect: Mood normal.        Behavior: Behavior normal.     LABS:      Latest Ref Rng & Units 05/19/2022    1:07 PM 10/28/2021    1:14 PM 04/05/2021   10:16 AM  CBC  WBC 4.0 - 10.5 K/uL 10.2  8.8  10.9   Hemoglobin 12.0 - 15.0 g/dL 40.9  81.1  91.4   Hematocrit 36.0 - 46.0 % 39.0  38.2  43.1   Platelets 150 - 400 K/uL 219  239  243       Latest Ref Rng & Units 05/19/2022    1:07 PM 10/28/2021    1:14 PM 04/05/2021   10:16 AM  CMP  Glucose 70 - 99 mg/dL 782  956  213   BUN 8 - 23 mg/dL 15  10  15    Creatinine 0.44 - 1.00 mg/dL 0.86  5.78  4.69   Sodium 135 - 145 mmol/L 135  141  138   Potassium 3.5 - 5.1 mmol/L 3.6  3.8  3.9   Chloride 98 - 111 mmol/L 104  106  103   CO2 22 - 32 mmol/L 24  28  27    Calcium 8.9 - 10.3 mg/dL 8.8  9.1  9.5   Total Protein 6.5 - 8.1 g/dL 6.6  6.6  7.2   Total Bilirubin 0.3 - 1.2 mg/dL 1.2  1.1  0.8   Alkaline Phos 38 - 126 U/L 47  47  69   AST 15 - 41 U/L 16  15  15    ALT 0 - 44 U/L 16  17  13       No results found for: "CEA1", "CEA" / No results found for: "CEA1", "CEA" No results found for: "PSA1" No results found for: "GEX528" No results found for: "CAN125"  No results found for: "TOTALPROTELP", "ALBUMINELP", "A1GS", "A2GS", "BETS", "BETA2SER", "GAMS", "MSPIKE", "SPEI" No results found for: "TIBC", "FERRITIN", "IRONPCTSAT" Lab Results  Component Value Date   LDH 142 10/28/2021   LDH 144 04/05/2021   LDH 128 11/13/2020     STUDIES:   ECHOCARDIOGRAM COMPLETE  Result Date: 05/23/2022    ECHOCARDIOGRAM REPORT   Patient Name:   MIKENZI SHAWVER Date of Exam: 05/23/2022 Medical Rec #:  [id number]          Height:       58.5 in  Accession #:    [id number]        Weight:       162.0 lb Date of Birth:  31-Jul-1945        BSA:          1.676 m Patient Age:    76 years          BP:           126/78 mmHg Patient Gender: F                 HR:           59 bpm. Exam Location:  [place] Procedure: 2D Echo, 3D Echo, Cardiac Doppler, Color Doppler and Strain Analysis Indications:    R06.9 DOE  History:        Patient has prior history of Echocardiogram examinations, most                 recent 12/28/2011. Arrythmias:Bradycardia and PVC,                 Signs/Symptoms:Dyspnea; Risk Factors:Current Smoker,                 Hypertension, Dyslipidemia and Sleep Apnea.  Sonographer:    Jake Seats RDMS, RVT, RDCS Referring Phys: [id number] VISHNU P MALLIPEDDI IMPRESSIONS  1. Left ventricular ejection fraction, by estimation, is 55 to 60%. The left ventricle has normal function. The left ventricle has no regional wall motion abnormalities. Left ventricular diastolic parameters are indeterminate. The average left ventricular global longitudinal strain is -22.3 %. The global longitudinal strain is normal.  2. Right ventricular systolic function is normal. The right ventricular size is normal. Tricuspid regurgitation signal is inadequate for assessing PA pressure.  3. The mitral valve is abnormal. Mild mitral valve regurgitation. No evidence of mitral stenosis.  4. The aortic valve is tricuspid. Aortic valve regurgitation is not visualized. No aortic stenosis is present.  5. The inferior vena cava is normal in size with greater than 50% respiratory variability, suggesting right atrial pressure of 3 mmHg. Comparison(s): Echocardiogram done 12/28/11 showed an EF of 60-65%. FINDINGS  Left Ventricle: Left ventricular ejection fraction, by estimation, is 55 to 60%. The left ventricle has normal function. The left ventricle has no regional wall motion abnormalities. The average left ventricular global longitudinal strain is -22.3 %. The global  longitudinal strain is normal. The left ventricular internal cavity size was normal in size. There is no left ventricular hypertrophy. Left ventricular diastolic parameters are indeterminate. Right Ventricle: The right ventricular size is normal. Right vetricular wall thickness was not well visualized. Right ventricular systolic function is normal. Tricuspid regurgitation signal is inadequate for assessing PA pressure. Left Atrium: Left atrial size was normal in size. Right Atrium: Right atrial size was normal in size. Pericardium: There is no evidence of pericardial effusion. Mitral Valve: The mitral valve is abnormal. Mild mitral valve regurgitation. No evidence of mitral valve stenosis. Tricuspid Valve: The tricuspid valve is normal in structure. Tricuspid valve regurgitation is not demonstrated. No evidence of tricuspid stenosis. Aortic Valve: The aortic valve is tricuspid. Aortic valve regurgitation is not visualized. No aortic stenosis is present. Aortic valve mean gradient measures 2.5 mmHg. Aortic valve peak gradient measures 4.8 mmHg. Aortic valve area, by VTI measures 2.73 cm. Pulmonic Valve: The pulmonic valve was not well visualized. Pulmonic valve regurgitation is not visualized. No evidence of pulmonic stenosis. Aorta: The aortic root is normal in size and structure. Venous: The inferior vena cava  is normal in size with greater than 50% respiratory variability, suggesting right atrial pressure of 3 mmHg. IAS/Shunts: No atrial level shunt detected by color flow Doppler.  LEFT VENTRICLE PLAX 2D LVIDd:         4.50 cm     Diastology LVIDs:         3.10 cm     LV e' medial:    5.11 cm/s LV PW:         1.10 cm     LV E/e' medial:  17.1 LV IVS:        1.00 cm     LV e' lateral:   7.18 cm/s LVOT diam:     1.90 cm     LV E/e' lateral: 12.2 LV SV:         74 LV SV Index:   44          2D Longitudinal Strain LVOT Area:     2.84 cm    2D Strain GLS (A2C):   -19.6 %                            2D Strain GLS  (A3C):   -24.8 %                            2D Strain GLS (A4C):   -22.6 % LV Volumes (MOD)           2D Strain GLS Avg:     -22.3 % LV vol d, MOD A2C: 57.9 ml LV vol d, MOD A4C: 62.8 ml LV vol s, MOD A2C: 27.0 ml LV vol s, MOD A4C: 18.4 ml 3D Volume EF: LV SV MOD A2C:     30.9 ml 3D EF:        59 % LV SV MOD A4C:     62.8 ml LV EDV:       106 ml LV SV MOD BP:      40.4 ml LV ESV:       44 ml                            LV SV:        62 ml RIGHT VENTRICLE RV S prime:     9.79 cm/s TAPSE (M-mode): 2.2 cm LEFT ATRIUM             Index        RIGHT ATRIUM           Index LA diam:        3.00 cm 1.79 cm/m   RA Area:     10.80 cm LA Vol (A2C):   50.3 ml 30.02 ml/m  RA Volume:   26.20 ml  15.63 ml/m LA Vol (A4C):   34.0 ml 20.29 ml/m LA Biplane Vol: 42.5 ml 25.36 ml/m  AORTIC VALVE AV Area (Vmax):    2.81 cm AV Area (Vmean):   2.50 cm AV Area (VTI):     2.73 cm AV Vmax:           109.00 cm/s AV Vmean:          73.150 cm/s AV VTI:            0.271 m AV Peak Grad:      4.8 mmHg AV Mean Grad:      2.5 mmHg  LVOT Vmax:         108.00 cm/s LVOT Vmean:        64.600 cm/s LVOT VTI:          0.261 m LVOT/AV VTI ratio: 0.96  AORTA Ao Root diam: 3.40 cm MITRAL VALVE               TRICUSPID VALVE MV Area (PHT): 2.93 cm    TR Peak grad:   13.4 mmHg MV Decel Time: 259 msec    TR Vmax:        183.00 cm/s MR Peak grad: 35.4 mmHg MR Vmax:      297.50 cm/s  SHUNTS MV E velocity: 87.40 cm/s  Systemic VTI:  0.26 m MV A velocity: 84.00 cm/s  Systemic Diam: 1.90 cm MV E/A ratio:  1.04 Dina Rich MD Electronically signed by Dina Rich MD Signature Date/Time: 05/23/2022/8:25:45 AM    Final

## 2022-05-26 ENCOUNTER — Encounter: Payer: Self-pay | Admitting: Hematology

## 2022-05-26 ENCOUNTER — Other Ambulatory Visit (HOSPITAL_COMMUNITY): Payer: Self-pay | Admitting: Hematology

## 2022-05-26 ENCOUNTER — Inpatient Hospital Stay (HOSPITAL_BASED_OUTPATIENT_CLINIC_OR_DEPARTMENT_OTHER): Payer: 59 | Admitting: Hematology

## 2022-05-26 VITALS — BP 112/74 | HR 76 | Temp 98.3°F | Resp 18 | Wt 159.0 lb

## 2022-05-26 DIAGNOSIS — N6320 Unspecified lump in the left breast, unspecified quadrant: Secondary | ICD-10-CM

## 2022-05-26 DIAGNOSIS — F17210 Nicotine dependence, cigarettes, uncomplicated: Secondary | ICD-10-CM | POA: Diagnosis not present

## 2022-05-26 DIAGNOSIS — M8580 Other specified disorders of bone density and structure, unspecified site: Secondary | ICD-10-CM | POA: Diagnosis not present

## 2022-05-26 DIAGNOSIS — D0511 Intraductal carcinoma in situ of right breast: Secondary | ICD-10-CM

## 2022-05-26 DIAGNOSIS — Z8509 Personal history of malignant neoplasm of other digestive organs: Secondary | ICD-10-CM | POA: Diagnosis not present

## 2022-05-26 DIAGNOSIS — Z801 Family history of malignant neoplasm of trachea, bronchus and lung: Secondary | ICD-10-CM | POA: Diagnosis not present

## 2022-05-26 DIAGNOSIS — Z800 Family history of malignant neoplasm of digestive organs: Secondary | ICD-10-CM | POA: Diagnosis not present

## 2022-05-26 DIAGNOSIS — Z803 Family history of malignant neoplasm of breast: Secondary | ICD-10-CM | POA: Diagnosis not present

## 2022-05-26 NOTE — Patient Instructions (Signed)
[facility] at [facility] Discharge Instructions   You were seen and examined today by Dr. Ellin Saba.  He reviewed the results of your lab work which are normal/stable.   He reviewed the results of your mammogram from December 2023 which was normal/stable. No evidence of cancer was seen.   Continue Tamoxifen as prescribed.   We will see you back in 6 months. We will repeat a mammogram and lab work prior to your next visit.      Thank you for choosing [facility] at [facility] to provide your oncology and hematology care.  To afford each patient quality time with our provider, please arrive at least 15 minutes before your scheduled appointment time.   If you have a lab appointment with the Cancer Center please come in thru the Main Entrance and check in at the main information desk.  You need to re-schedule your appointment should you arrive 10 or more minutes late.  We strive to give you quality time with our providers, and arriving late affects you and other patients whose appointments are after yours.  Also, if you no show three or more times for appointments you may be dismissed from the clinic at the providers discretion.     Again, thank you for choosing [facility].  Our hope is that these requests will decrease the amount of time that you wait before being seen by our physicians.       _____________________________________________________________  Should you have questions after your visit to [facility], please contact our office at [phone] and follow the prompts.  Our office hours are 8:00 a.m. and 4:30 p.m. Monday - Friday.  Please note that voicemails left after 4:00 p.m. may not be returned until the following business day.  We are closed weekends and major holidays.  You do have access to a nurse 24-7, just call the main number to the clinic [phone] and do not press any options, hold on the line  and a nurse will answer the phone.    For prescription refill requests, have your pharmacy contact our office and allow 72 hours.    Due to Covid, you will need to wear a mask upon entering the hospital. If you do not have a mask, a mask will be given to you at the Main Entrance upon arrival. For doctor visits, patients may have 1 support person age 35 or older with them. For treatment visits, patients can not have anyone with them due to social distancing guidelines and our immunocompromised population.

## 2022-06-20 ENCOUNTER — Telehealth: Payer: Self-pay

## 2022-06-20 ENCOUNTER — Ambulatory Visit: Payer: 59 | Attending: Internal Medicine | Admitting: Cardiology

## 2022-06-20 ENCOUNTER — Other Ambulatory Visit: Payer: Self-pay | Admitting: *Deleted

## 2022-06-20 DIAGNOSIS — R0683 Snoring: Secondary | ICD-10-CM

## 2022-06-20 DIAGNOSIS — G4733 Obstructive sleep apnea (adult) (pediatric): Secondary | ICD-10-CM

## 2022-06-20 DIAGNOSIS — I10 Essential (primary) hypertension: Secondary | ICD-10-CM

## 2022-06-20 DIAGNOSIS — R400 Somnolence: Secondary | ICD-10-CM

## 2022-06-20 DIAGNOSIS — R5383 Other fatigue: Secondary | ICD-10-CM

## 2022-06-20 MED ORDER — TAMOXIFEN CITRATE 20 MG PO TABS: 20.0000 mg | ORAL_TABLET | Freq: Every day | ORAL | 0 refills | Status: DC

## 2022-06-20 NOTE — Telephone Encounter (Signed)
error 

## 2022-06-20 NOTE — Telephone Encounter (Signed)
Tamoxifen refill approved.  Patient is tolerating and is to continue therapy. 

## 2022-06-24 ENCOUNTER — Ambulatory Visit (INDEPENDENT_AMBULATORY_CARE_PROVIDER_SITE_OTHER): Payer: 59 | Admitting: Physician Assistant

## 2022-06-24 ENCOUNTER — Encounter: Payer: Self-pay | Admitting: Physician Assistant

## 2022-06-24 VITALS — BP 121/68 | HR 97 | Resp 20 | Ht 58.5 in | Wt 160.0 lb

## 2022-06-24 DIAGNOSIS — R4189 Other symptoms and signs involving cognitive functions and awareness: Secondary | ICD-10-CM

## 2022-06-24 NOTE — Progress Notes (Signed)
Assessment/Plan:   Cognitive Deficits   Cassandra Fowler is a delightful 77 y.o. RH female  with a history of hypertension, hyperlipidemia, recent shingles at the R flank area (02/2022), R breast cancer on tamoxifen, GERD, GIST, Prediabetes, Depression, with a recent neuropsychological evaluation with a functioning generally within normal limits, with overall performance not suggestive of Alzheimer's disease or any other form of neurodegenerative illness at the present.  Personally reviewed MRI of the brain 04/15/2022 without evidence of acute intracranial abnormalities, remarkable for mild chronic small vessel ischemic changes within the cerebral white matter, mild generalized parenchymal atrophy. Patient is able to participate on his IADLs with some guidance from her daughter.    Recommendations:   Follow up in 1 year   Recommend neuropsychological evaluation in 24 months or sooner if functional decline is noted. Recommend good control of cardiovascular risk factors Recommend addressing chronic pain, ongoing sleep dysfunction, psychosocial stressors and hearing loss issues with PCP and other specialties.  Patient is scheduled for sleep study. Continue to control mood as per PCP    Subjective:   This patient is accompanied in the office by her daughter  who supplements the history. Previous records as well as any outside records available were reviewed prior to todays visit.   Patient was last seen on  03/25/22 with [facility] 25/30      Any changes in memory since last visit?  "Memory is about the same" Patient has some difficulty remembering recent conversations and people names  She continues to enjoy drawing and painting.  She enjoys crossword puzzles, word finding. repeats oneself?  Endorsed "constantly"-her daughter says . Patient says that her daughter talks too low.  Disoriented when walking into a room?  Patient denies  Leaving objects in unusual places?  Patient denies   Wandering  behavior?   denies   Any personality changes since last visit? denies   Any worsening depression?: denies   Hallucinations or paranoia?  denies   Seizures?   denies    Any sleep changes? "Sleeping less, gives me a pain in the b.."   She reports vivid dreams, denies REM behavior or sleepwalking   Sleep apnea? "They did a sleep study" Any hygiene concerns?   denies   Independent of bathing and dressing?  Endorsed  Does the patient needs help with medications? Daughter is in charge   Who is in charge of the finances? Daughter  is in charge  but "we kinda collaborate"    Any changes in appetite?  denies     Patient have trouble swallowing? Had Botox for esophageal stricture, "may need more " Does the patient cook?  Yes   Any kitchen accidents such as leaving the stove on?   denies   Any headaches?    denies   Vision changes? denies Chronic back pain  denies   Ambulates with difficulty? Needs the walker, but she does not like to use it and "then falls"  No head injury or LOC Unilateral weakness, numbness or tingling?   denies   Any tremors?  denies   Any anosmia?    denies   Any incontinence of urine? Urge incontinence, wears Depends  Any bowel dysfunction?  denies      Patient lives with her daughter  Does the patient drive? Yes, short distances, denies ay issues   Initial evaluation 03/25/2022  How long did patient have memory difficulties?  About 3 years. Patient has some difficulty remembering recent conversations and people names, although  is able to participate in most activities.  She has sometimes trouble recognizing people, or remembering appointments and medications.  Her LTM is better than the short-term memory.  She enjoys doing crossword puzzles and word finding.  She also enjoys drawing and painting. repeats oneself?  Endorsed Disoriented when walking into a room?  Patient denies, trying to adapt to the new home, as they recently moved from [place].   Leaving objects in unusual  places?  Endorsed, for example losing the phone and finding it in the microwave, coffee cups in the oven etc.  Wandering behavior? denies   Any personality changes since last visit?  Patient denies, but daughter reports that she has more mood swings for the last 1-1/2 years, and she can get argumentative.   Any history of depression?: Endorsed, "depression runs in the family ", although she has not been formally diagnosed or treated.  She does not have a psychotherapist. Hallucinations or paranoia?  denies   Seizures? denies    Any sleep changes?  "Most of the time "-daughter says. Denies  vivid dreams, REM behavior or sleepwalking   Sleep apnea? "They are going  to check on that because she snores very loud" Any hygiene concerns? Endorsed for the last 1.5 y, needs reminder, "2 weeks come back by and she does not take a shower ".  Independent of bathing and dressing?  Endorsed, has arthritis so she needs help with buttoning.  Does the patient need help with medications?  Daughter is in charge over the last 6 months because she was missing doses. She is now on pillpack's  Who is in charge of the finances?  Daughter is in charge over the last 5 months because she was missing payments Any changes in appetite?  Eats well, she may forget to eat at times, but she may be able to snack.    Patient have trouble swallowing?  denies   Does the patient cook?  Any kitchen accidents such as leaving the stove on? Patient denies   Any headaches? She has GERD and achalasia so she had esophageal dilatation and  Botox injections with good results  Chronic  pain?  Endorsed due to arthritis  Ambulates with difficulty? Uses a walker and cane for stability. She is going to try PT as OP  Recent falls or head injuries? denies     Vision changes? Had "scar tissue on the R after cataracts, L has retinal detachment history, defers eye surgery "because she has to sleep on her stomach  for 30 days and does not want to do it.    Unilateral weakness, numbness or tingling?  denies   Any tremors?  Denies  Any anosmia? "Always been off" Any incontinence of urine?Endorsed, uses Depends  Any bowel dysfunction?    denies      Patient lives  daughter  History of heavy alcohol intake? denies   History of heavy tobacco use? denies   Family history of dementia?  Her mother had dementia ?type  Dose patient drive? short distances, only during the day, not frequently , lately she does not feel comfortable anymore   Neuropsychological evaluation on 03/31/2022. Briefly, results suggested neuropsychological functioning generally within normal limits relative to age-matched peers. Some performance variability was exhibited across executive functioning, particularly surrounding cognitive flexibility and response inhibition. Performances across tasks assessing verbal reasoning and safety/judgment were appropriate. Performances were also appropriate across processing speed, attention/concentration, receptive and expressive language, visuospatial abilities, and all aspects of learning and memory.  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   BREAST BIOPSY Right 11/06/2020   Procedure: BREAST BIOPSY;  Surgeon: Franky Macho, MD;  Location: [facility];  Service: General;  Laterality: Right;   BREAST LUMPECTOMY WITH RADIOFREQUENCY TAG IDENTIFICATION Right 11/06/2020   Procedure: BREAST LUMPECTOMY WITH RADIOFREQUENCY TAG IDENTIFICATION;  Surgeon: Franky Macho, MD;  Location: [facility];  Service: General;  Laterality: Right;   CATARACT EXTRACTION Right 03/28/2017   [facility]   CATARACT EXTRACTION Left 03/14/2017   [facility]   COLONOSCOPY  01/28/2013   Dr. Stacey Drain, [place], [place]; Hyperplastic polyp x2, sessile serrated adenoma x2, tubular adenoma with low-grade dysplasia x1.  Recommend repeat colonoscopy in 3 years.   COLONOSCOPY WITH PROPOFOL N/A 04/02/2020   Surgeon: Corbin Ade, MD; Diverticulosis in the sigmoid and descending colon, cecal AVM, three 5-8 millimeters polyps in the rectum, mid rectum, and ascending colon resected and retrieved.  Pathology with 2 tubular adenomas, 1 hyperplastic polyp.  Recommended repeat colonoscopy in 5  years if health permits.   ESOPHAGOGASTRODUODENOSCOPY  04/24/2019   [place]; 2.4 cm gastric submucosal mass in the fundus consistent with GIST.  FNA consistent with GIST.   ESOPHAGOGASTRODUODENOSCOPY  01/28/2013   Dr. Ihor Austin Devers in [place], [place]; normal esophagus, hernia at GE junction, normal examined stomach, normal examined duodenum.  Does not appear dilation was performed.   ESOPHAGOGASTRODUODENOSCOPY (EGD) WITH PROPOFOL N/A 04/02/2020   Surgeon: Corbin Ade, MD;  Erosive reflux esophagitis, somewhat dilated distal esophagus s/p 60 French Maloney dilation, stigmata of prior gastric surgery, no evidence of recurrent/persisting GIST, normal examined duodenum.    GIST tumor resection  06/19/2019   [facility], robotic assisted laparoscopic partial gastrectomy x2.  Pathology consistent with GIST   HELLER  MYOTOMY  06/19/2019   [facility]; esophageal myotomy with hiatal hernia repair and dor fundoplication   left hand surgery     MALONEY DILATION N/A 04/02/2020   Procedure: MALONEY DILATION;  Surgeon: Corbin Ade, MD;  Location: [facility];  Service: Endoscopy;  Laterality: N/A;   POLYPECTOMY  04/02/2020   Procedure: POLYPECTOMY;  Surgeon: Corbin Ade, MD;  Location: [facility];  Service: Endoscopy;;   tubal ligation     UMBILICAL HERNIA REPAIR     1970s   UPPER ESOPHAGEAL ENDOSCOPIC ULTRASOUND (EUS)  04/24/2019   [facility]; endoscopic findings: Tortuous esophagus s/p balloon dilation to 18 mm with no effect s/p biopsy, submucosal mass in the fundus, normal duodenum; endosonographic findings: 2.4 cm x 2.4 cm submucosal mass in the fundus s/p FNA, cholelithiasis, no hepatic, CBD, or pancreatic abnormalities.  Pathology: GIST, hepatic parenchyma with mild steato-fibrosis, reflux esophagitis   YAG laser posterior capsulotomy Left 08/30/2018   [facility], left eye.     PREVIOUS MEDICATIONS:   CURRENT  MEDICATIONS:  Outpatient Encounter Medications as of 06/24/2022  Medication Sig   acetaminophen (TYLENOL) 500 MG tablet Take 500 mg by mouth every 6 (six) hours as needed for moderate pain.   ascorbic acid (VITAMIN C) 1000 MG tablet Take 1 tablet by mouth daily.   Aspirin-Caffeine (BAYER BACK & BODY PO) Take by mouth daily as needed.   Calcium Carbonate-Vitamin D3 600-400 MG-UNIT TABS Take 1 tablet by mouth daily.   Cholecalciferol 50 MCG (2000 UT) TABS Take by mouth daily.   losartan (COZAAR) 50 MG tablet Take 50 mg by mouth daily.   pantoprazole (PROTONIX) 40 MG tablet Take 1 tablet (40 mg total) by mouth daily.   Probiotic Product (PHILLIPS COLON HEALTH) CAPS Take 1 capsule by mouth daily.   rosuvastatin (CRESTOR) 10 MG tablet Take 10 mg by mouth daily.   tamoxifen (NOLVADEX) 20 MG tablet Take 1 tablet (20 mg total) by mouth daily.   tizanidine (ZANAFLEX) 2 MG capsule Take 2 mg by mouth as needed for muscle spasms.   No facility-administered encounter medications on file as of 06/24/2022.     Objective:     PHYSICAL EXAMINATION:    VITALS:   Vitals:   06/24/22 0913  BP: 121/68  Pulse: 97  Resp: 20  SpO2: 98%  Weight: 160 lb (72.6 kg)  Height: 4' 10.5" (1.486 m)    GEN:  The patient appears stated age and is in NAD. HEENT:  Normocephalic, atraumatic.   Neurological examination:  General: NAD, well-groomed, appears stated age. Orientation: The patient is alert. Oriented to person, place and date Cranial nerves: There is good facial symmetry. Edentulous. The speech is fluent and clear. No aphasia or dysarthria. Fund of knowledge is appropriate. Recent memory impaired and remote memory is normal.  Attention and concentration are normal.  Able to name objects and repeat phrases.  Hearing is decreased to conversational tone .     Sensation: Sensation is intact to light touch throughout Motor: Strength is at least antigravity x4. DTR's 2/4 in UE/LE      03/25/2022    8:00 AM   Montreal Cognitive Assessment   Visuospatial/ Executive (0/5) 4  Naming (0/3) 3  Attention: Read list of digits (0/2) 2  Attention: Read list of letters (0/1) 1  Attention: Serial 7 subtraction starting at 100 (0/3) 3  Language: Repeat phrase (0/2) 2  Language : Fluency (0/1) 1  Abstraction (0/2) 0  Delayed Recall (0/5) 3  Orientation (0/6) 6  Total 25  Adjusted Score (based on education) 25        No data to display             Movement examination: Tone: There is normal tone in the UE/LE Abnormal movements:  no tremor.  No myoclonus.  No asterixis.   Coordination:  There is no decremation with RAM's. Normal finger to nose  Gait and Station: The patient has some difficulty arising out of a deep-seated chair without the use of the hands. The patient's stride length is good.  Gait is cautious and narrow.   Thank you for allowing [place] the opportunity to participate in the care of this nice patient. Please do not hesitate to contact us for any questions or concerns.   Total time spent on today's visit was 23 minutes dedicated to this patient today, preparing to see patient, examining the patient, ordering tests and/or medications and counseling the patient, documenting clinical information in the EHR or other health record, independently interpreting results and communicating results to the patient/family, discussing treatment and goals, answering patient's questions and coordinating care.  Cc:  Wilmon Pali, FNP  Marlowe Kays 06/24/2022 12:22 PM

## 2022-06-26 NOTE — Procedures (Signed)
   Patient Name: Cassandra Fowler, Cassandra Fowler Date:06/20/2022 Gender: Female D.O.B: [birth year] Age (years): 63 Referring Provider: Marjo Bicker, MD Height (inches): 59 Interpreting Physician: Armanda Magic MD, ABSM Weight (lbs): 160 RPSGT: Alfonso Ellis BMI: 33 MRN: [id number] Neck Size: 15.00  CLINICAL INFORMATION Sleep Study Type: NPSG  Indication for sleep study: N/A  Epworth Sleepiness Score: 7  SLEEP STUDY TECHNIQUE As per the AASM Manual for the Scoring of Sleep and Associated Events v2.3 (April 2016) with a hypopnea requiring 4% desaturations.  The channels recorded and monitored were frontal, central and occipital EEG, electrooculogram (EOG), submentalis EMG (chin), nasal and oral airflow, thoracic and abdominal wall motion, anterior tibialis EMG, snore microphone, electrocardiogram, and pulse oximetry.  MEDICATIONS Medications self-administered by patient taken the night of the study : N/A  SLEEP ARCHITECTURE The study was initiated at 10:56:11 PM and ended at 5:32:39 AM.  Sleep onset time was 16.7 minutes and the sleep efficiency was 75.4%. The total sleep time was 299 minutes.  Stage REM latency was 222.5 minutes.  The patient spent 8.70% of the night in stage N1 sleep, 72.91% in stage N2 sleep, 12.04% in stage N3 and 6.4% in REM.  Alpha intrusion was absent.  Supine sleep was 22.21%.  RESPIRATORY PARAMETERS The overall apnea/hypopnea index (AHI) was 7.4 per hour. There were 6 total apneas, including 3 obstructive, 3 central and 0 mixed apneas. There were 31 hypopneas and 0 RERAs.  The AHI during Stage REM sleep was 18.9 per hour.  AHI while supine was 9.9 per hour.  The mean oxygen saturation was 90.44%. The minimum SpO2 during sleep was 83.00%.  moderate snoring was noted during this study.  CARDIAC DATA The 2 lead EKG demonstrated sinus rhythm. The mean heart rate was 93.03 beats per minute. Other EKG findings include: None  LEG MOVEMENT  DATA The total PLMS were 15 with a resulting PLMS index of 3/hr. Associated arousal with leg movement index was 15.5/hr .  IMPRESSIONS - Mild obstructive sleep apnea occurred during this study (AHI = 7.4/h). - No significant central sleep apnea occurred during this study (CAI = 0.6/h). - Mild oxygen desaturation was noted during this study (Min O2 = 83.00%). - The patient snored with moderate snoring volume.  DIAGNOSIS - Obstructive Sleep Apnea (G47.33) - Nocturnal Hypoxemia (G47.36)  RECOMMENDATIONS - Overall mild obstructive sleep apnea wutg AGU 7.4/hr but moderate during REM sleep with REM AHI 18.9/hr. - Recommend in lab CPAP titration. - Avoid alcohol, sedatives and other CNS depressants that may worsen sleep apnea and disrupt normal sleep architecture. - Sleep hygiene should be reviewed to assess factors that may improve sleep quality. - Weight management and regular exercise should be initiated or continued if appropriate.  [Electronically signed] 06/26/2022 10:04 AM  Armanda Magic MD, ABSM Diplomate, American Board of Sleep Medicine

## 2022-06-27 DIAGNOSIS — M79644 Pain in right finger(s): Secondary | ICD-10-CM | POA: Diagnosis not present

## 2022-06-27 DIAGNOSIS — M1811 Unilateral primary osteoarthritis of first carpometacarpal joint, right hand: Secondary | ICD-10-CM | POA: Diagnosis not present

## 2022-06-28 ENCOUNTER — Other Ambulatory Visit: Payer: Self-pay

## 2022-06-28 DIAGNOSIS — D0511 Intraductal carcinoma in situ of right breast: Secondary | ICD-10-CM

## 2022-06-28 MED ORDER — TAMOXIFEN CITRATE 20 MG PO TABS: 20.0000 mg | ORAL_TABLET | Freq: Every day | ORAL | 0 refills | Status: DC

## 2022-06-30 NOTE — Progress Notes (Signed)
Referring Provider: Wilmon Pali, FNP Primary Care Physician:  Wilmon Pali, FNP Primary GI Physician: Dr. Jena Gauss  Chief Complaint  Patient presents with   Follow-up    Medication refill and having some heart burn     HPI:   Cassandra Fowler is a 77 y.o. female presenting today for follow-up.   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 BPE January 2022 with slight dilation of distal esophagus just above the GE junction potentially related to prior myotomy and achalasia, no mass or stricture, diffuse dysmotility. EGD 04/02/20: Erosive reflux esophagitis, somewhat dilated distal esophagus s/p 60 French Maloney dilation, stigmata of prior gastric surgery, no evidence of recurrent/persisting GIST, normal examined duodenum.  Ultimately established with [facility] for achalasia.   Also with history of steatohepatitis/static fibrosis noted on GIST tumor biopsies in 2021 as these biopsies also contained cores of hepatic parenchyma. Abdominal ultrasound with elastography 02/04/2020 with diffusely increased parenchymal echogenicity.  kPa 1.6.    History of gallbladder adenomyomatosis, dilated CBD.  MRI/MRCP March 2022 without obvious filling defect.  Prior EUS in April 2021 in [place] after CT also noted dilated CBD revealing no significant liver, biliary, or pancreatic abnormalities.  Recommended monitoring LFTs   Last seen in the office 03/19/2021.  GERD well-controlled on Prilosec daily.  She is following with [facility] for dysphagia and continued with symptoms including regurgitation during or after meals occurring with anything including liquids.  She has been working on weight loss through limiting snacks, sweets, trying  to eat healthier overall.  No other significant GI concerns.  Advised to continue daily PPI, keep upcoming appointment with [facility] for EGD with Botox, and recommended 65-month follow-up.  Received labs from PCP dated 02/28/2021 with LFTs within normal limits.  Platelets also normal.  EGD with [facility] 05/03/2021 with dilation of the entire esophagus s/p Botox injection antireflux surgical site intact with healthy-appearing mucosa. Otherwise, normal exam.   Today:  GERD: Usually does very well with pantoprazole 40 mg daily. Had some heartburn this morning.  Usually takes Tums or Pepto-Bismol as needed.  No nausea or vomiting.   Dysphagia:  Improved with dilation and botox injection.   Dilated CBD: LFTs wnl May 2024.   Fatty Liver: No abnormalities on CT in March 2023.  LFTs wnl May 2024. Platelets also normal.  Denies abdominal distention, lower extreme edema, yellowing of the eyes or skin, bruising, bleeding, mental status changes.   Bowels move fairly well. No brbpr or melena. Occasionally will have constipation that is resolved with fig bars.   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   BREAST BIOPSY Right 11/06/2020   Procedure: BREAST BIOPSY;  Surgeon: Franky Macho, MD;  Location: [facility];  Service: General;  Laterality: Right;   BREAST LUMPECTOMY WITH RADIOFREQUENCY TAG IDENTIFICATION Right 11/06/2020   Procedure: BREAST LUMPECTOMY WITH RADIOFREQUENCY TAG IDENTIFICATION;  Surgeon: Franky Macho, MD;  Location: [facility];  Service: General;  Laterality: Right;   CATARACT EXTRACTION Right 03/28/2017   [facility]   CATARACT EXTRACTION Left 03/14/2017   [facility]   COLONOSCOPY  01/28/2013   Dr. Stacey Drain, [place], [place]; Hyperplastic polyp x2, sessile serrated adenoma x2, tubular adenoma with low-grade dysplasia x1.  Recommend repeat colonoscopy in 3 years.   COLONOSCOPY WITH PROPOFOL N/A 04/02/2020   Surgeon: Corbin Ade, MD; Diverticulosis in the sigmoid and descending colon, cecal AVM, three 5-8 millimeters polyps in the rectum, mid rectum, and ascending colon resected and retrieved.  Pathology with 2 tubular adenomas, 1 hyperplastic polyp.  Recommended repeat colonoscopy in 5  years if health permits.   ESOPHAGOGASTRODUODENOSCOPY  04/24/2019   [place]; 2.4 cm gastric submucosal mass in the fundus consistent with GIST.  FNA consistent with GIST.   ESOPHAGOGASTRODUODENOSCOPY  01/28/2013   Dr. Ihor Austin Devers in [place], [place]; normal esophagus, hernia at GE junction, normal examined stomach, normal examined duodenum.  Does not appear dilation was performed.   ESOPHAGOGASTRODUODENOSCOPY (EGD) WITH PROPOFOL N/A 04/02/2020   Surgeon: Corbin Ade, MD;  Erosive reflux esophagitis, somewhat dilated distal esophagus s/p 60 French Maloney dilation, stigmata of prior gastric surgery, no evidence of recurrent/persisting GIST, normal examined duodenum.    GIST tumor resection  06/19/2019   [facility], robotic assisted laparoscopic partial gastrectomy x2.  Pathology consistent with GIST   HELLER MYOTOMY  06/19/2019   [facility]; esophageal myotomy with hiatal hernia repair and dor fundoplication   left hand surgery     MALONEY DILATION N/A 04/02/2020   Procedure: Elease Hashimoto DILATION;  Surgeon: Corbin Ade, MD;  Location: [facility];  Service: Endoscopy;  Laterality: N/A;   POLYPECTOMY  04/02/2020   Procedure: POLYPECTOMY;  Surgeon: Corbin Ade, MD;  Location: [facility];  Service: Endoscopy;;   tubal ligation     UMBILICAL HERNIA REPAIR     1970s   UPPER ESOPHAGEAL ENDOSCOPIC ULTRASOUND (EUS)  04/24/2019   [facility]; endoscopic findings: Tortuous esophagus s/p balloon dilation to 18 mm with  no effect s/p biopsy, submucosal mass in the fundus, normal duodenum; endosonographic findings: 2.4 cm x 2.4 cm submucosal mass in the fundus s/p FNA, cholelithiasis, no hepatic, CBD, or pancreatic abnormalities.  Pathology: GIST, hepatic parenchyma with mild steato-fibrosis, reflux esophagitis   YAG laser posterior capsulotomy Left 08/30/2018   [facility], left eye.    Current Outpatient Medications   Medication Sig Dispense Refill   acetaminophen (TYLENOL) 500 MG tablet Take 500 mg by mouth every 6 (six) hours as needed for moderate pain.     ascorbic acid (VITAMIN C) 1000 MG tablet Take 1 tablet by mouth daily.     Aspirin-Caffeine (BAYER BACK & BODY PO) Take by mouth daily as needed.     Calcium Carbonate-Vitamin D3 600-400 MG-UNIT TABS Take 1 tablet by mouth daily.     Cholecalciferol 50 MCG (2000 UT) TABS Take by mouth daily.     cyanocobalamin (VITAMIN B12) 1000 MCG tablet Take 1,000 mcg by mouth daily.     losartan (COZAAR) 50 MG tablet Take 50 mg by mouth daily.     pantoprazole (PROTONIX) 40 MG tablet Take 1 tablet (40 mg total) by mouth daily. 90 tablet 3   Probiotic Product (PHILLIPS COLON HEALTH) CAPS Take 1 capsule by mouth daily.     rosuvastatin (CRESTOR) 10 MG tablet Take 10 mg by mouth daily.     tamoxifen (NOLVADEX) 20 MG tablet Take 1 tablet (20 mg total) by mouth daily. 90 tablet 0   tizanidine (ZANAFLEX) 2 MG capsule Take 2 mg by mouth as needed for muscle spasms.     No current facility-administered medications for this visit.    Allergies as of 07/01/2022 - Review Complete 07/01/2022  Allergen Reaction Noted   Penicillins Hives, Swelling, and Other (See Comments) 01/17/2020   Somatropin Anaphylaxis 09/02/2020   Tilactase Anaphylaxis 09/02/2020   Tape Rash 03/05/2020   Indomethacin Hives 01/17/2020   Tolectin [tolmetin] Hives 01/17/2020   Sulfa antibiotics Hives and Rash 09/30/2020   Wound dressing adhesive Rash 01/17/2020    Family History  Problem Relation Age of Onset   Stroke Mother    Hypertension Mother    Hearing loss Mother    Cancer Mother        d. 68 "where the stomach meets the intestine" "apple-core"   Heart disease Mother    Varicose Veins Mother    Dementia Mother    Stroke Father    Heart disease Father    Hearing loss Father    Lung cancer Brother 51       smoker   Hearing loss Brother    Depression Maternal Grandmother     Diabetes Maternal Grandfather    Colon cancer Paternal Grandmother        dx in her 56s   Hypertension Daughter    Heart disease Daughter    Hearing loss Daughter    Diabetes Daughter    Depression Daughter    Vaginal cancer Daughter        dx <53   Uterine cancer Daughter        dx <53   Breast cancer Daughter        dx <53   Drug abuse Daughter    Kidney disease Daughter    Miscarriages / [place] Daughter    Breast cancer Maternal Aunt        unknown age of diagnosis   Breast cancer Maternal Aunt        unknown age of  diagnosis   Breast cancer Cousin        unknown age of diagnosis (maternal first cousin)   Breast cancer Cousin        unknown age of diagnosis (maternal first cousin)   Miscarriages / [place] Granddaughter    Hearing loss Granddaughter    Drug abuse Granddaughter    Depression Granddaughter     Social History   Socioeconomic History   Marital status: Widowed    Spouse name: Not on file   Number of children: 1   Years of education: 12   Highest education level: High school graduate  Occupational History   Occupation: retired  Tobacco Use   Smoking status: Every Day    Types: E-cigarettes   Smokeless tobacco: Never   Tobacco comments:    Daily vaping; 1 cartridge lasts about a month  Vaping Use   Vaping Use: Never used  Substance and Sexual Activity   Alcohol use: Not Currently    Comment: glass of wine occ   Drug use: Never   Sexual activity: Not Currently    Birth control/protection: Post-menopausal, Abstinence  Other Topics Concern   Not on file  Social History Narrative   Left handed   One child   Lives with daughter   Drinks caffeine prn   Mobile home   Social Determinants of Health   Financial Resource Strain: Not on file  Food Insecurity: Not on file  Transportation Needs: No Transportation Needs (03/05/2020)   PRAPARE - Transportation    Lack of Transportation (Medical): No    Lack of Transportation (Non-Medical): No   Physical Activity: Inactive (03/05/2020)   Exercise Vital Sign    Days of Exercise per Week: 0 days    Minutes of Exercise per Session: 0 min  Stress: Not on file  Social Connections: Not on file    Review of Systems: Gen: Denies fever, chills, cold or flulike symptoms, presyncope, syncope. GI: See HPI Heme: See HPI  Physical Exam: BP 138/88 (BP Location: Right Arm, Patient Position: Sitting, Cuff Size: Normal)   Pulse 71   Temp 98 F (36.7 C) (Temporal)   Ht 4\' 10"  (1.473 m)   Wt 163 lb (73.9 kg)   SpO2 94%   BMI 34.07 kg/m  General:   Alert and oriented. No distress noted. Pleasant and cooperative.  Head:  Normocephalic and atraumatic. Eyes:  Conjuctiva clear without scleral icterus. Heart:  S1, S2 present without murmurs appreciated. Lungs:  Clear to auscultation bilaterally. No wheezes, rales, or rhonchi. No distress.  Abdomen:  +BS, soft, non-tender and non-distended. No rebound or guarding. No HSM or masses noted. Msk:  Symmetrical without gross deformities. Normal posture. Extremities:  Without edema. Neurologic:  Alert and  oriented x4 Psych:  Normal mood and affect.    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  GERD: Fairly well-controlled on pantoprazole 40 mg daily.  Occasional breakthrough that she controls with Tums or Pepto-Bismol.   Dysphagia: Improved s/p EGD with esophageal dilation and Botox injection at [facility] in April 2023.  Dilated CBD: Chronic.  MRI/MRCP March 2022 with no obvious filling defect.  Prior  EUS in April 2021 in [place] with dilated CBD, but  no other significant abnormalities.  We have recommended to monitor LFTs which have remained within normal limits, most recent labs May 2024.  Fatty liver: Doing well at this time with no signs or symptoms of decompensated liver disease though she has gained some weight.  LFTs and platelets remain within normal limits with most recent labs May 2024.   Plan:  Continue pantoprazole 40 mg daily 30 minutes for breakfast. May use Tums, Pepcid, Mylanta, Pepto-Bismol as needed for breakthrough. Reinforced GERD diet/lifestyle.  Written instructions provided on AVS. Weight loss through diet and exercise due to history of fatty liver.  Follow-up in 1 year or sooner if needed.    Ermalinda Memos, PA-C [facility] 07/01/2022

## 2022-07-01 ENCOUNTER — Ambulatory Visit (INDEPENDENT_AMBULATORY_CARE_PROVIDER_SITE_OTHER): Payer: 59 | Admitting: Gastroenterology

## 2022-07-01 ENCOUNTER — Telehealth: Payer: Self-pay | Admitting: Gastroenterology

## 2022-07-01 ENCOUNTER — Encounter: Payer: Self-pay | Admitting: Gastroenterology

## 2022-07-01 VITALS — BP 138/88 | HR 71 | Temp 98.0°F | Ht <= 58 in | Wt 163.0 lb

## 2022-07-01 DIAGNOSIS — K838 Other specified diseases of biliary tract: Secondary | ICD-10-CM | POA: Diagnosis not present

## 2022-07-01 DIAGNOSIS — R1310 Dysphagia, unspecified: Secondary | ICD-10-CM | POA: Diagnosis not present

## 2022-07-01 DIAGNOSIS — K760 Fatty (change of) liver, not elsewhere classified: Secondary | ICD-10-CM | POA: Diagnosis not present

## 2022-07-01 DIAGNOSIS — K2100 Gastro-esophageal reflux disease with esophagitis, without bleeding: Secondary | ICD-10-CM | POA: Diagnosis not present

## 2022-07-01 NOTE — Telephone Encounter (Signed)
Spoke to pt, informed her I sent a handout for fatty liver. Pt voiced understanding.

## 2022-07-01 NOTE — Patient Instructions (Signed)
Continue pantoprazole 40 mg daily 30 minutes before breakfast.  If you have breakthrough reflux symptoms, you can take Tums, Pepcid, Mylanta, Pepto-Bismol as needed.  To prevent reflux symptoms: Avoid fried, fatty, greasy, spicy, citrus foods. Avoid caffeine and carbonated beverages. Avoid chocolate. Try eating 4-6 small meals a day rather than 3 large meals. Do not eat within 3 hours of laying down. Prop head of bed up on wood or bricks to create a 6 inch incline.    It was great to see you again today! I am glad you are feeling well overall!  Will plan to follow-up with you in 1 year or sooner if needed.  Ermalinda Memos, PA-C [facility]

## 2022-07-01 NOTE — Telephone Encounter (Signed)
Toni Amend, can you let patient know I forgot to add fatty liver instructions to her AVS and mail the following instructions along with the fatty liver handout to her?   Recommendations for fatty liver: Recommend 1-2# weight loss per week until ideal body weight through exercise & diet. Low fat/cholesterol diet.   Avoid sweets, sodas, fruit juices, sweetened beverages like tea, etc. Gradually increase exercise from 15 min daily up to 1 hr per day 5 days/week.

## 2022-07-12 DIAGNOSIS — H6091 Unspecified otitis externa, right ear: Secondary | ICD-10-CM | POA: Diagnosis not present

## 2022-07-29 DIAGNOSIS — Z7182 Exercise counseling: Secondary | ICD-10-CM | POA: Diagnosis not present

## 2022-07-29 DIAGNOSIS — R7303 Prediabetes: Secondary | ICD-10-CM | POA: Diagnosis not present

## 2022-07-29 DIAGNOSIS — R252 Cramp and spasm: Secondary | ICD-10-CM | POA: Diagnosis not present

## 2022-07-29 DIAGNOSIS — E785 Hyperlipidemia, unspecified: Secondary | ICD-10-CM | POA: Diagnosis not present

## 2022-07-29 DIAGNOSIS — K219 Gastro-esophageal reflux disease without esophagitis: Secondary | ICD-10-CM | POA: Diagnosis not present

## 2022-07-29 DIAGNOSIS — Z79899 Other long term (current) drug therapy: Secondary | ICD-10-CM | POA: Diagnosis not present

## 2022-07-29 DIAGNOSIS — Z713 Dietary counseling and surveillance: Secondary | ICD-10-CM | POA: Diagnosis not present

## 2022-07-29 DIAGNOSIS — I10 Essential (primary) hypertension: Secondary | ICD-10-CM | POA: Diagnosis not present

## 2022-09-09 ENCOUNTER — Telehealth: Payer: Self-pay

## 2022-09-09 NOTE — Telephone Encounter (Signed)
 Left VM with callback number for patient to receive sleep study results and recommendations.

## 2022-09-09 NOTE — Telephone Encounter (Signed)
-----   Message from Armanda Magic sent at 06/26/2022 10:05 AM EDT ----- Please let patient know that they have sleep apnea.  Recommend therapeutic CPAP titration for treatment of patient's sleep disordered breathing.  If unable to perform an in lab titration then initiate ResMed auto CPAP from 4 to 15cm H2O with heated humidity and mask of choice and overnight pulse ox on CPAP.

## 2022-09-23 ENCOUNTER — Telehealth: Payer: Self-pay | Admitting: *Deleted

## 2022-09-23 ENCOUNTER — Telehealth: Payer: Self-pay | Admitting: Internal Medicine

## 2022-09-23 DIAGNOSIS — M1811 Unilateral primary osteoarthritis of first carpometacarpal joint, right hand: Secondary | ICD-10-CM | POA: Diagnosis not present

## 2022-09-23 NOTE — Telephone Encounter (Signed)
S/w the pt's daughter ok per the pt. Pt has been scheduled fro tele pre op appt 11/04/22 @ 10:20. Med rec and consent are done.

## 2022-09-23 NOTE — Telephone Encounter (Signed)
Name: Cassandra Fowler  DOB: 1945-05-02  MRN: [id number]  Primary Cardiologist: Marjo Bicker, MD   Preoperative team, please contact this patient and set up a phone call appointment for further preoperative risk assessment. Please obtain consent and complete medication review. Thank you for your help.  I confirm that guidance regarding antiplatelet and oral anticoagulation therapy has been completed and, if necessary, noted below (none requested).   Perlie Gold, PA-C 09/23/2022, 11:45 AM [facility]

## 2022-09-23 NOTE — Telephone Encounter (Signed)
S/w the pt's daughter ok per the pt. Pt has been scheduled fro tele pre op appt 11/04/22 @ 10:20. Med rec and consent are done.     Patient Consent for Virtual Visit        Cassandra Fowler has provided verbal consent on 09/23/2022 for a virtual visit (video or telephone).   CONSENT FOR VIRTUAL VISIT FOR:  Cassandra Fowler  By participating in this virtual visit I agree to the following:  I hereby voluntarily request, consent and authorize [facility] and its employed or contracted physicians, physician assistants, nurse practitioners or other licensed health care professionals (the Practitioner), to provide me with telemedicine health care services (the "Services") as deemed necessary by the treating Practitioner. I acknowledge and consent to receive the Services by the Practitioner via telemedicine. I understand that the telemedicine visit will involve communicating with the Practitioner through live audiovisual communication technology and the disclosure of certain medical information by electronic transmission. I acknowledge that I have been given the opportunity to request an in-person assessment or other available alternative prior to the telemedicine visit and am voluntarily participating in the telemedicine visit.  I understand that I have the right to withhold or withdraw my consent to the use of telemedicine in the course of my care at any time, without affecting my right to future care or treatment, and that the Practitioner or I may terminate the telemedicine visit at any time. I understand that I have the right to inspect all information obtained and/or recorded in the course of the telemedicine visit and may receive copies of available information for a reasonable fee.  I understand that some of the potential risks of receiving the Services via telemedicine include:  Delay or interruption in medical evaluation due to technological equipment failure or  disruption; Information transmitted may not be sufficient (e.g. poor resolution of images) to allow for appropriate medical decision making by the Practitioner; and/or  In rare instances, security protocols could fail, causing a breach of personal health information.  Furthermore, I acknowledge that it is my responsibility to provide information about my medical history, conditions and care that is complete and accurate to the best of my ability. I acknowledge that Practitioner's advice, recommendations, and/or decision may be based on factors not within their control, such as incomplete or inaccurate data provided by me or distortions of diagnostic images or specimens that may result from electronic transmissions. I understand that the practice of medicine is not an exact science and that Practitioner makes no warranties or guarantees regarding treatment outcomes. I acknowledge that a copy of this consent can be made available to me via my patient portal [facility] MyChart), or I can request a printed copy by calling the office of [facility].    I understand that my insurance will be billed for this visit.   I have read or had this consent read to me. I understand the contents of this consent, which adequately explains the benefits and risks of the Services being provided via telemedicine.  I have been provided ample opportunity to ask questions regarding this consent and the Services and have had my questions answered to my satisfaction. I give my informed consent for the services to be provided through the use of telemedicine in my medical care

## 2022-09-23 NOTE — Telephone Encounter (Signed)
Pre-operative Risk Assessment    Patient Name: Cassandra Fowler  DOB: May 22, 1945 MRN: [id number]      Request for Surgical Clearance    Procedure:  Right Thumb Carpo Medicorpal Arthroplasty  Date of Surgery:  Clearance 11-29-22                                   Surgeon:  Dr Kerry Fort Surgeon's Group or Practice Name:   Phone number:  [phone] Fax number:  [phone]   Type of Clearance Requested:   - Medical    Type of Anesthesia:   Block   Additional requests/questions:    SignedLaurence Ferrari   09/23/2022, 9:34 AM

## 2022-10-26 ENCOUNTER — Ambulatory Visit
Admission: EM | Admit: 2022-10-26 | Discharge: 2022-10-26 | Disposition: A | Discharge status: Home / Self Care | Payer: 59 | Attending: Nurse Practitioner | Admitting: Nurse Practitioner

## 2022-10-26 ENCOUNTER — Encounter: Payer: Self-pay | Admitting: Emergency Medicine

## 2022-10-26 ENCOUNTER — Other Ambulatory Visit: Payer: Self-pay

## 2022-10-26 DIAGNOSIS — Z1152 Encounter for screening for COVID-19: Secondary | ICD-10-CM | POA: Insufficient documentation

## 2022-10-26 DIAGNOSIS — J069 Acute upper respiratory infection, unspecified: Secondary | ICD-10-CM | POA: Insufficient documentation

## 2022-10-26 LAB — POCT INFLUENZA A/B
Influenza A, POC: NEGATIVE
Influenza B, POC: NEGATIVE

## 2022-10-26 NOTE — Discharge Instructions (Signed)
You have a viral upper respiratory infection.  Symptoms should improve over the next week to 10 days.  If you develop chest pain or shortness of breath, go to the emergency room.  We have tested you today for COVID-19.  You will see the results in Mychart and we will contact you with positive results.  Please stay home and isolate until you are fever free for 24 hours without fever reducing medication.    Some things that can make you feel better are: - Increased rest - Increasing fluid with water/sugar free electrolytes - Acetaminophen and ibuprofen as needed for fever/pain - Salt water gargling, chloraseptic spray and throat lozenges - OTC guaifenesin (Mucinex) 600 mg twice daily for congestion - Saline sinus flushes or a neti pot - Humidifying the air

## 2022-10-26 NOTE — ED Provider Notes (Signed)
[facility]    CSN: [id number] Arrival date & time: 10/26/22  1401      History   Chief Complaint Chief Complaint  Patient presents with   Cough    HPI Cassandra Fowler is a 77 y.o. female.   Patient presents today for 1 day history of congested cough, nasal congestion, headache, sore throat, and fatigue.  No fever, body aches or chills, chest pain, shortness of breath, wheezing, runny nose, ear pain, abdominal pain, nausea/vomiting, diarrhea, and change in appetite.  No known sick contacts.  Has been taking Coricidin and cough syrup with improvement in symptoms.    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 The report date is 05/09/2020     The Multi-Cancer + RNA Panel offered by [facility] includes sequencing and/or deletion/duplication analysis of the following 84 genes:  AIP*, ALK, APC*, ATM*, AXIN2*,    GERD (gastroesophageal reflux disease)    GIST (gastrointestinal stroma tumor), malignant, colon    s/p [place] 2 [place] [facility] [place] tumors in June 2021 at [facility] [place]   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   Pre-diabetes    Pulmonary nodule, left 11/25/2020   6 mm  LUL on CT 05/20/2020   Sclerosing adenosis of right breast    Tobacco use 08/05/2021    Patient Active Problem List   Diagnosis Date Noted   Engages in vaping 04/25/2022   HTN (hypertension) 04/25/2022   DOE (dyspnea on exertion) 04/25/2022   Subjective memory complaints    Pre-diabetes 03/30/2022   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   Tobacco use 08/05/2021   Family history of breast cancer 11/25/2020   Family history of colon cancer 11/25/2020   Family history of uterine cancer 11/25/2020   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   Pulmonary nodule, left 11/25/2020   Breast neoplasm, Tis (DCIS), 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   Sclerosing adenosis of right breast    Achalasia 05/13/2020   Fatty liver 05/13/2020   Abnormal gallbladder ultrasound 05/13/2020   Genetic testing 05/09/2020   Family history of lung cancer    Common bile duct dilatation 03/10/2020   Dyslipidemia 03/10/2020   Dysphagia 01/17/2020   GIST (gastrointestinal stroma tumor), malignant, colon 01/17/2020   GERD (gastroesophageal reflux disease) 01/17/2020    Past Surgical History:  Procedure Laterality Date   BREAST BIOPSY Right 09/06/2019   [facility]; evaluation of suspicious calcification of right breast; pathology with atypical ductal hyperplasia, focal cystic dilation of ducts with columnar cell change of epithelium, presence of microcalcifications.   BREAST BIOPSY Right 11/06/2020   Procedure: BREAST BIOPSY;  Surgeon: Franky Macho, MD;  Location: [facility];  Service: General;  Laterality: Right;  BREAST LUMPECTOMY WITH RADIOFREQUENCY TAG IDENTIFICATION Right 11/06/2020   Procedure: BREAST LUMPECTOMY WITH RADIOFREQUENCY TAG IDENTIFICATION;  Surgeon: Franky Macho, MD;  Location: [facility];  Service: General;  Laterality: Right;   CATARACT EXTRACTION Right 03/28/2017   [facility]   CATARACT EXTRACTION Left 03/14/2017   [facility]   COLONOSCOPY  01/28/2013    Dr. Stacey Drain, [place], [place]; Hyperplastic polyp x2, sessile serrated adenoma x2, tubular adenoma with low-grade dysplasia x1.  Recommend repeat colonoscopy in 3 years.   COLONOSCOPY WITH PROPOFOL N/A 04/02/2020   Surgeon: Corbin Ade, MD; Diverticulosis in the sigmoid and descending colon, cecal AVM, three 5-8 millimeters polyps in the rectum, mid rectum, and ascending colon resected and retrieved.  Pathology with 2 tubular adenomas, 1 hyperplastic polyp.  Recommended repeat colonoscopy in 5 years if health permits.   ESOPHAGOGASTRODUODENOSCOPY  04/24/2019   [place]; 2.4 cm gastric submucosal mass in the fundus consistent with GIST.  FNA consistent with GIST.   ESOPHAGOGASTRODUODENOSCOPY  01/28/2013   Dr. Ihor Austin Devers in [place], [place]; normal esophagus, hernia at GE junction, normal examined stomach, normal examined duodenum.  Does not appear dilation was performed.   ESOPHAGOGASTRODUODENOSCOPY (EGD) WITH PROPOFOL N/A 04/02/2020   Surgeon: Corbin Ade, MD;  Erosive reflux esophagitis, somewhat dilated distal esophagus s/p 60 French Maloney dilation, stigmata of prior gastric surgery, no evidence of recurrent/persisting GIST, normal examined duodenum.    GIST tumor resection  06/19/2019   [facility], robotic assisted laparoscopic partial gastrectomy x2.  Pathology consistent with GIST   HELLER MYOTOMY  06/19/2019   [facility]; esophageal myotomy with hiatal hernia repair and dor fundoplication   left hand surgery     MALONEY DILATION N/A 04/02/2020   Procedure: Elease Hashimoto DILATION;  Surgeon: Corbin Ade, MD;  Location: [facility];  Service: Endoscopy;  Laterality: N/A;   POLYPECTOMY  04/02/2020   Procedure: POLYPECTOMY;  Surgeon: Corbin Ade, MD;  Location: [facility];  Service: Endoscopy;;   tubal ligation     UMBILICAL HERNIA REPAIR     1970s   UPPER ESOPHAGEAL ENDOSCOPIC ULTRASOUND (EUS)  04/24/2019   [facility]; endoscopic findings: Tortuous esophagus s/p balloon dilation to 18 mm with no effect s/p biopsy, submucosal mass in the fundus, normal duodenum; endosonographic findings: 2.4 cm x 2.4 cm submucosal mass in the fundus s/p FNA, cholelithiasis, no hepatic, CBD, or pancreatic abnormalities.  Pathology: GIST, hepatic parenchyma with mild steato-fibrosis, reflux esophagitis   YAG laser posterior capsulotomy Left 08/30/2018   [facility], left eye.    OB History   No obstetric history on file.      Home Medications    Prior to Admission medications   Medication Sig Start Date End Date Taking? Authorizing Provider  acetaminophen (TYLENOL) 500 MG tablet Take 500 mg by mouth every 6 (six) hours as needed for moderate pain.    [provider]  ascorbic acid (VITAMIN C) 1000 MG tablet Take 1 tablet by mouth daily.    [provider]  Aspirin-Caffeine (BAYER BACK & BODY PO) Take by mouth daily as needed.    [provider]  Calcium Carbonate-Vitamin D3 600-400 MG-UNIT TABS Take 1 tablet by mouth daily.    [provider]  Cholecalciferol 50 MCG (2000 UT) TABS Take by mouth daily.    [provider]  cyanocobalamin (VITAMIN B12) 1000 MCG tablet Take 1,000 mcg by mouth daily.    [provider]  losartan (COZAAR) 50 MG tablet Take 50 mg by mouth daily. 08/22/20   [provider]  pantoprazole (PROTONIX) 40 MG tablet Take 1 tablet (40 mg total) by mouth daily. 03/19/21   Letta Median, PA-C  Probiotic Product [facility] COLON HEALTH) CAPS Take 1 capsule by mouth daily.    [provider]  rosuvastatin (CRESTOR) 10 MG tablet Take 10 mg by mouth daily.    [provider]  tamoxifen (NOLVADEX) 20 MG tablet Take 1 tablet (20 mg total) by mouth daily. 06/28/22   Doreatha Massed, MD  tizanidine (ZANAFLEX) 2 MG capsule Take 2 mg by mouth as needed for muscle spasms.    [provider]     Family History Family History  Problem Relation Age of Onset   Stroke Mother    Hypertension Mother    Hearing loss Mother    Cancer Mother        d. 44 "where the stomach meets the intestine" "apple-core"   Heart disease Mother    Varicose Veins Mother    Dementia Mother    Stroke Father    Heart disease Father    Hearing loss Father    Lung cancer Brother 53       smoker   Hearing loss Brother    Depression Maternal Grandmother    Diabetes Maternal Grandfather    Colon cancer Paternal Grandmother        dx in her 38s   Hypertension Daughter    Heart disease Daughter    Hearing loss Daughter    Diabetes Daughter    Depression Daughter    Vaginal cancer Daughter        dx <53   Uterine cancer Daughter        dx <53   Breast cancer Daughter        dx <53   Drug abuse Daughter    Kidney disease Daughter    Miscarriages / [place] Daughter    Breast cancer Maternal Aunt        unknown age of diagnosis   Breast cancer Maternal Aunt        unknown age of diagnosis   Breast cancer Cousin        unknown age of diagnosis (maternal first cousin)   Breast cancer Cousin        unknown age of diagnosis (maternal first cousin)   Miscarriages / [place] Granddaughter    Hearing loss Granddaughter    Drug abuse Granddaughter    Depression Granddaughter     Social History Social History   Tobacco Use   Smoking status: Every Day    Types: E-cigarettes   Smokeless tobacco: Never   Tobacco comments:    Daily vaping; 1 cartridge lasts about a month  Vaping Use   Vaping status: Never Used  Substance Use Topics   Alcohol use: Not Currently    Comment: glass of wine occ   Drug use: Never     Allergies   Penicillins, Somatropin, Tilactase, Tape, Indomethacin, Tolectin [tolmetin], Sulfa antibiotics, and Wound dressing adhesive   Review of Systems Review of Systems Per HPI  Physical Exam Triage Vital Signs ED Triage Vitals  Encounter Vitals Group      BP 10/26/22 1409 114/78     Systolic BP Percentile --      Diastolic BP Percentile --      Pulse Rate 10/26/22 1409 84     Resp 10/26/22 1409 20  Temp 10/26/22 1409 98.8 F (37.1 C)     Temp Source 10/26/22 1409 Oral     SpO2 10/26/22 1409 96 %     Weight --      Height --      Head Circumference --      Peak Flow --      Pain Score 10/26/22 1408 3     Pain Loc --      Pain Education --      Exclude from Growth Chart --    No data found.  Updated Vital Signs BP 114/78 (BP Location: Right Arm)   Pulse 84   Temp 98.8 F (37.1 C) (Oral)   Resp 20   SpO2 96%   Visual Acuity Right Eye Distance:   Left Eye Distance:   Bilateral Distance:    Right Eye Near:   Left Eye Near:    Bilateral Near:     Physical Exam Vitals and nursing note reviewed.  Constitutional:      General: She is not in acute distress.    Appearance: Normal appearance. She is not ill-appearing or toxic-appearing.  HENT:     Head: Normocephalic and atraumatic.     Right Ear: Tympanic membrane, ear canal and external ear normal.     Left Ear: Tympanic membrane, ear canal and external ear normal.     Nose: No congestion or rhinorrhea.     Mouth/Throat:     Mouth: Mucous membranes are moist.     Pharynx: Oropharynx is clear. Posterior oropharyngeal erythema present. No oropharyngeal exudate.  Eyes:     General: No scleral icterus.    Extraocular Movements: Extraocular movements intact.  Cardiovascular:     Rate and Rhythm: Normal rate and regular rhythm.  Pulmonary:     Effort: Pulmonary effort is normal. No respiratory distress.     Breath sounds: Normal breath sounds. No wheezing, rhonchi or rales.  Musculoskeletal:     Cervical back: Normal range of motion and neck supple.  Lymphadenopathy:     Cervical: No cervical adenopathy.  Skin:    General: Skin is warm and dry.     Coloration: Skin is not jaundiced or pale.     Findings: No erythema or rash.  Neurological:     Mental Status: She  is alert and oriented to person, place, and time.  Psychiatric:        Behavior: Behavior is cooperative.      UC Treatments / Results  Labs (all labs ordered are listed, but only abnormal results are displayed) Labs Reviewed  SARS CORONAVIRUS 2 (TAT 6-24 HRS)  POCT INFLUENZA A/B    EKG   Radiology No results found.  Procedures Procedures (including critical care time)  Medications Ordered in UC Medications - No data to display  Initial Impression / Assessment and Plan / UC Course  I have reviewed the triage vital signs and the nursing notes.  Pertinent labs & imaging results that were available during my care of the patient were reviewed by me and considered in my medical decision making (see chart for details).   Patient is well-appearing, normotensive, afebrile, not tachycardic, not tachypneic, oxygenating well on room air.    1. Viral URI with cough 2. Encounter for screening for COVID-19 Suspect viral etiology Vitals and exam are reassuring Influenza test negative COVID-19 test pending Supportive care discussed with patient ER and return precautions also discussed  The patient was given the opportunity to ask questions.  All  questions answered to their satisfaction.  The patient is in agreement to this plan.    Final Clinical Impressions(s) / UC Diagnoses   Final diagnoses:  Viral URI with cough  Encounter for screening for COVID-19     Discharge Instructions      You have a viral upper respiratory infection.  Symptoms should improve over the next week to 10 days.  If you develop chest pain or shortness of breath, go to the emergency room.  We have tested you today for COVID-19.  You will see the results in Mychart and we will contact you with positive results.  Please stay home and isolate until you are fever free for 24 hours without fever reducing medication.    Some things that can make you feel better are: - Increased rest - Increasing fluid with  water/sugar free electrolytes - Acetaminophen and ibuprofen as needed for fever/pain - Salt water gargling, chloraseptic spray and throat lozenges - OTC guaifenesin (Mucinex) 600 mg twice daily for congestion - Saline sinus flushes or a neti pot - Humidifying the air    ED Prescriptions   None    PDMP not reviewed this encounter.   Valentino Nose, NP 10/26/22 1454

## 2022-10-26 NOTE — ED Triage Notes (Signed)
Pt reports cough, headache, general malaise since yesterday. Denies any known fever.

## 2022-10-27 LAB — SARS CORONAVIRUS 2 (TAT 6-24 HRS): SARS Coronavirus 2: NEGATIVE

## 2022-11-01 DIAGNOSIS — J00 Acute nasopharyngitis [common cold]: Secondary | ICD-10-CM | POA: Diagnosis not present

## 2022-11-01 DIAGNOSIS — J069 Acute upper respiratory infection, unspecified: Secondary | ICD-10-CM | POA: Diagnosis not present

## 2022-11-03 ENCOUNTER — Telehealth: Payer: Self-pay | Admitting: Internal Medicine

## 2022-11-03 NOTE — Progress Notes (Signed)
Virtual Visit via Telephone Note   Because of Cassandra Fowler's co-morbid illnesses, she is at least at moderate risk for complications without adequate follow up.  This format is felt to be most appropriate for this patient at this time.  The patient did not have access to video technology/had technical difficulties with video requiring transitioning to audio format only (telephone).  All issues noted in this document were discussed and addressed.  No physical exam could be performed with this format.  Please refer to the patient's chart for her consent to telehealth for [facility].  Evaluation Performed:  Preoperative cardiovascular risk assessment _____________   Date:  11/03/2022   Patient ID:  Cassandra Fowler, DOB Mar 24, 1945, MRN [id number] Patient Location:  Home Provider location:   Office  Primary Care Provider:  Wilmon Pali, FNP Primary Cardiologist:  Marjo Bicker, MD  Chief Complaint / Patient Profile   77 y.o. y/o female with a h/o hypertension, prediabetes, mild MR, hyperlipidemia, nicotine abuse through vaping, suspected sleep apnea who is pending right thumb carpo medicorpal arthroplasty with Dr. Kerry Fort on 11/29/22 and presents today for telephonic preoperative cardiovascular risk assessment.  History of Present Illness    Cassandra Fowler is a 77 y.o. female who presents via audio/video conferencing for a telehealth visit today.  Pt was last seen in cardiology clinic on 04/25/22 by Dr. Jenene Slicker.  At that time Kelsye Hulen was doing well.  She underwent echocardiogram which did not reveal any significant abnormalities, only mild MR.  The patient is now pending procedure as outlined above. Since her last visit, she denies chest pain, shortness of breath, lower extremity edema, fatigue, palpitations, weakness, presyncope, syncope, orthopnea, and PND. She is able to achieve > 4 METS activity by walking around [place], doing house work and  yard work. She is not having any concerning cardiac symptoms.   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 The report date is 05/09/2020     The Multi-Cancer + RNA Panel offered by [facility] includes sequencing and/or deletion/duplication analysis of the following 84 genes:  AIP*, ALK, APC*, ATM*, AXIN2*,    GERD (gastroesophageal reflux disease)    GIST (gastrointestinal stroma tumor), malignant, colon    s/p resection of 2 tumors in June 2021 [facility] [place] resection [place] 2 tumors in June 2021 at [facility] in [place]   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   Pre-diabetes    Pulmonary nodule, left 11/25/2020   6 mm LUL on CT 05/20/2020   Sclerosing adenosis of right breast    Tobacco use 08/05/2021   Past Surgical History:  Procedure Laterality Date   BREAST BIOPSY Right 09/06/2019   [facility]; evaluation of suspicious calcification of right breast; pathology with atypical ductal hyperplasia, focal cystic dilation of ducts with columnar cell change of epithelium, presence of microcalcifications.   BREAST BIOPSY Right 11/06/2020   Procedure: BREAST BIOPSY;  Surgeon: Franky Macho, MD;  Location: [facility];   Service: General;  Laterality: Right;   BREAST LUMPECTOMY WITH RADIOFREQUENCY TAG IDENTIFICATION Right 11/06/2020   Procedure: BREAST LUMPECTOMY WITH RADIOFREQUENCY TAG IDENTIFICATION;  Surgeon: Franky Macho, MD;  Location: [facility];  Service: General;  Laterality: Right;   CATARACT EXTRACTION Right 03/28/2017   [facility]   CATARACT EXTRACTION Left 03/14/2017   [facility]   COLONOSCOPY  01/28/2013   Dr. Stacey Drain, [place], [place]; Hyperplastic polyp x2, sessile serrated adenoma x2, tubular adenoma with low-grade dysplasia x1.  Recommend repeat colonoscopy in 3 years.   COLONOSCOPY WITH PROPOFOL N/A 04/02/2020   Surgeon: Corbin Ade, MD; Diverticulosis in the sigmoid and descending colon, cecal AVM, three 5-8 millimeters polyps in the rectum, mid rectum, and ascending colon resected and retrieved.  Pathology with 2 tubular adenomas, 1 hyperplastic polyp.  Recommended repeat colonoscopy in 5 years if health permits.   ESOPHAGOGASTRODUODENOSCOPY  04/24/2019   [place]; 2.4 cm gastric submucosal mass in the fundus consistent with GIST.  FNA consistent with GIST.   ESOPHAGOGASTRODUODENOSCOPY  01/28/2013   Dr. Ihor Austin Devers in [place], [place]; normal esophagus, hernia at GE junction, normal examined stomach, normal examined duodenum.  Does not appear dilation was performed.   ESOPHAGOGASTRODUODENOSCOPY (EGD) WITH PROPOFOL N/A 04/02/2020   Surgeon: Corbin Ade, MD;  Erosive reflux esophagitis, somewhat dilated distal esophagus s/p 60 French Maloney dilation, stigmata of prior gastric surgery, no evidence of recurrent/persisting GIST, normal examined duodenum.    GIST tumor resection  06/19/2019   [facility], robotic assisted laparoscopic partial gastrectomy x2.  Pathology consistent with GIST   HELLER MYOTOMY  06/19/2019   [facility]; esophageal myotomy with hiatal hernia repair and dor fundoplication   left hand  surgery     MALONEY DILATION N/A 04/02/2020   Procedure: Elease Hashimoto DILATION;  Surgeon: Corbin Ade, MD;  Location: [facility];  Service: Endoscopy;  Laterality: N/A;   POLYPECTOMY  04/02/2020   Procedure: POLYPECTOMY;  Surgeon: Corbin Ade, MD;  Location: [facility];  Service: Endoscopy;;   tubal ligation     UMBILICAL HERNIA REPAIR     1970s   UPPER ESOPHAGEAL ENDOSCOPIC ULTRASOUND (EUS)  04/24/2019   [facility]; endoscopic findings: Tortuous esophagus s/p balloon dilation to 18 mm with no effect s/p biopsy, submucosal mass in the fundus, normal duodenum; endosonographic findings: 2.4 cm x 2.4 cm submucosal mass in the fundus s/p FNA, cholelithiasis, no hepatic, CBD, or pancreatic abnormalities.  Pathology: GIST, hepatic parenchyma with mild steato-fibrosis, reflux esophagitis   YAG laser posterior capsulotomy Left 08/30/2018   [facility], left eye.    Allergies  Allergies  Allergen Reactions   Penicillins Hives, Swelling and Other (See Comments)   Somatropin Anaphylaxis   Tilactase Anaphylaxis   Tape Rash   Indomethacin Hives   Tolectin [Tolmetin] Hives    Pt can not recall   Sulfa Antibiotics Hives and Rash   Wound Dressing Adhesive Rash    Adhesive tape     Home Medications    Prior to Admission medications   Medication Sig Start Date End Date Taking? Authorizing Provider  acetaminophen (TYLENOL) 500 MG tablet Take 500 mg by mouth every 6 (  six) hours as needed for moderate pain.    [provider]  ascorbic acid (VITAMIN C) 1000 MG tablet Take 1 tablet by mouth daily.    [provider]  Aspirin-Caffeine (BAYER BACK & BODY PO) Take by mouth daily as needed.    [provider]  Calcium Carbonate-Vitamin D3 600-400 MG-UNIT TABS Take 1 tablet by mouth daily.    [provider]  Cholecalciferol 50 MCG (2000 UT) TABS Take by mouth daily.    [provider]  cyanocobalamin (VITAMIN B12)  1000 MCG tablet Take 1,000 mcg by mouth daily.    [provider]  losartan (COZAAR) 50 MG tablet Take 50 mg by mouth daily. 08/22/20   [provider]  pantoprazole (PROTONIX) 40 MG tablet Take 1 tablet (40 mg total) by mouth daily. 03/19/21   Letta Median, PA-C  Probiotic Product [facility] COLON HEALTH) CAPS Take 1 capsule by mouth daily.    [provider]  rosuvastatin (CRESTOR) 10 MG tablet Take 10 mg by mouth daily.    [provider]  tamoxifen (NOLVADEX) 20 MG tablet Take 1 tablet (20 mg total) by mouth daily. 06/28/22   Doreatha Massed, MD  tizanidine (ZANAFLEX) 2 MG capsule Take 2 mg by mouth as needed for muscle spasms.    [provider]    Physical Exam    Vital Signs:  Mauriana Oats does not have vital signs available for review today.  Given telephonic nature of communication, physical exam is limited. AAOx3. NAD. Normal affect.  Speech and respirations are unlabored.  Accessory Clinical Findings    None  Assessment & Plan    1.  Preoperative Cardiovascular Risk Assessment: According to the Revised Cardiac Risk Index (RCRI), her Perioperative Risk of Major Cardiac Event is (%): 0.4. Her Functional Capacity in METs is: 5.62 according to the Duke Activity Status Index (DASI). The patient is doing well from a cardiac perspective. Therefore, based on ACC/AHA guidelines, the patient would be at acceptable risk for the planned procedure without further cardiovascular testing.   The patient was advised that if she develops new symptoms prior to surgery to contact our office to arrange for a follow-up visit, and she verbalized understanding.  No cardiac medications to hold.   A copy of this note will be routed to requesting surgeon.  Time:   Today, I have spent 10 minutes with the patient with telehealth technology discussing medical history, symptoms, and management plan.    Levi Aland, NP-C  11/04/2022, 10:20  AM [address] Office [phone] Fax [phone]

## 2022-11-03 NOTE — Telephone Encounter (Signed)
Daughter Alvino Chapel calling stating that pcp is requesting that a in lab sleep study w/ cpap be ordered for pt. Please reach out to daughter in the mornings due to work schedule per her request to advise.

## 2022-11-04 ENCOUNTER — Ambulatory Visit: Payer: 59 | Attending: Nurse Practitioner | Admitting: Nurse Practitioner

## 2022-11-04 ENCOUNTER — Encounter: Payer: Self-pay | Admitting: Nurse Practitioner

## 2022-11-04 DIAGNOSIS — Z01810 Encounter for preprocedural cardiovascular examination: Secondary | ICD-10-CM

## 2022-11-07 ENCOUNTER — Other Ambulatory Visit: Payer: Self-pay

## 2022-11-07 DIAGNOSIS — G4733 Obstructive sleep apnea (adult) (pediatric): Secondary | ICD-10-CM

## 2022-11-09 ENCOUNTER — Encounter (HOSPITAL_COMMUNITY): Payer: Self-pay | Admitting: Emergency Medicine

## 2022-11-09 ENCOUNTER — Other Ambulatory Visit: Payer: Self-pay

## 2022-11-09 ENCOUNTER — Emergency Department (HOSPITAL_COMMUNITY)
Admission: EM | Admit: 2022-11-09 | Discharge: 2022-11-10 | Disposition: A | Discharge status: Home / Self Care | Payer: 59 | Attending: Emergency Medicine | Admitting: Emergency Medicine

## 2022-11-09 ENCOUNTER — Emergency Department (HOSPITAL_COMMUNITY): Payer: 59

## 2022-11-09 ENCOUNTER — Other Ambulatory Visit (HOSPITAL_COMMUNITY): Payer: 59

## 2022-11-09 DIAGNOSIS — Z7982 Long term (current) use of aspirin: Secondary | ICD-10-CM | POA: Diagnosis not present

## 2022-11-09 DIAGNOSIS — J069 Acute upper respiratory infection, unspecified: Secondary | ICD-10-CM | POA: Insufficient documentation

## 2022-11-09 DIAGNOSIS — Z853 Personal history of malignant neoplasm of breast: Secondary | ICD-10-CM | POA: Insufficient documentation

## 2022-11-09 DIAGNOSIS — I10 Essential (primary) hypertension: Secondary | ICD-10-CM | POA: Diagnosis not present

## 2022-11-09 DIAGNOSIS — F17210 Nicotine dependence, cigarettes, uncomplicated: Secondary | ICD-10-CM | POA: Insufficient documentation

## 2022-11-09 DIAGNOSIS — Z1152 Encounter for screening for COVID-19: Secondary | ICD-10-CM | POA: Insufficient documentation

## 2022-11-09 DIAGNOSIS — Z79899 Other long term (current) drug therapy: Secondary | ICD-10-CM | POA: Diagnosis not present

## 2022-11-09 DIAGNOSIS — B9789 Other viral agents as the cause of diseases classified elsewhere: Secondary | ICD-10-CM | POA: Diagnosis not present

## 2022-11-09 DIAGNOSIS — R059 Cough, unspecified: Secondary | ICD-10-CM | POA: Diagnosis not present

## 2022-11-09 LAB — SARS CORONAVIRUS 2 BY RT PCR: SARS Coronavirus 2 by RT PCR: NEGATIVE

## 2022-11-09 MED ORDER — IPRATROPIUM-ALBUTEROL 0.5-2.5 (3) MG/3ML IN SOLN
3.0000 mL | Freq: Once | RESPIRATORY_TRACT | Status: AC
  Administered 2022-11-09: 3 mL via RESPIRATORY_TRACT
  Filled 2022-11-09: qty 3

## 2022-11-09 MED ORDER — PREDNISONE 50 MG PO TABS
60.0000 mg | ORAL_TABLET | Freq: Once | ORAL | Status: AC
  Administered 2022-11-09: 60 mg via ORAL
  Filled 2022-11-09: qty 1

## 2022-11-09 MED ORDER — PREDNISONE 50 MG PO TABS: 50.0000 mg | ORAL_TABLET | Freq: Every day | ORAL | 0 refills | Status: DC

## 2022-11-09 MED ORDER — BENZONATATE 100 MG PO CAPS: 100.0000 mg | ORAL_CAPSULE | Freq: Three times a day (TID) | ORAL | 0 refills | Status: DC

## 2022-11-09 NOTE — ED Triage Notes (Signed)
Pt presents with cough, was diagnosed with URI placed on antibiotics, symptoms not any better.

## 2022-11-09 NOTE — ED Provider Notes (Signed)
[place] EMERGENCY DEPARTMENT AT [facility] Provider Note   CSN: [id number] Arrival date & time: 11/09/22  1754     History {Add pertinent medical, surgical, social history, OB history to HPI:1} Chief Complaint  Patient presents with   Cough    Priscillia Cart is a 77 y.o. female.  The history is provided by the patient.  Cough She has history of hypertension, hyperlipidemia, gastrointestinal stromal tumor, breast cancer, achalasia and complains of productive cough for the last 2 weeks.  Cough is productive of yellowish sputum.  She denies fever, chills, sweats.  She denies any chest pain.  She had gone to an urgent care where she was told it was a viral illness and told to take over-the-counter medications.  1 week ago, she went to her primary care provider who gave her a course of doxycycline and benzonatate.  She has run out of the benzonatate and she does feel that that was somewhat helpful.  She also received a steroid injection which gave some temporary improvement.  As she is still smoking cigarettes.   Home Medications Prior to Admission medications   Medication Sig Start Date End Date Taking? Authorizing Provider  acetaminophen (TYLENOL) 500 MG tablet Take 500 mg by mouth every 6 (six) hours as needed for moderate pain.    [provider]  ascorbic acid (VITAMIN C) 1000 MG tablet Take 1 tablet by mouth daily.    [provider]  Aspirin-Caffeine (BAYER BACK & BODY PO) Take by mouth daily as needed.    [provider]  Calcium Carbonate-Vitamin D3 600-400 MG-UNIT TABS Take 1 tablet by mouth daily.    [provider]  Cholecalciferol 50 MCG (2000 UT) TABS Take by mouth daily.    [provider]  cyanocobalamin (VITAMIN B12) 1000 MCG tablet Take 1,000 mcg by mouth daily.    [provider]  losartan (COZAAR) 50 MG tablet Take 50 mg by mouth daily. 08/22/20   [provider]  pantoprazole (PROTONIX) 40  MG tablet Take 1 tablet (40 mg total) by mouth daily. 03/19/21   Letta Median, PA-C  Probiotic Product [facility] COLON HEALTH) CAPS Take 1 capsule by mouth daily.    [provider]  rosuvastatin (CRESTOR) 10 MG tablet Take 10 mg by mouth daily.    [provider]  tamoxifen (NOLVADEX) 20 MG tablet Take 1 tablet (20 mg total) by mouth daily. 06/28/22   Doreatha Massed, MD  tizanidine (ZANAFLEX) 2 MG capsule Take 2 mg by mouth as needed for muscle spasms.    [provider]      Allergies    Penicillins, Somatropin, Tilactase, Tape, Indomethacin, Tolectin [tolmetin], Sulfa antibiotics, and Wound dressing adhesive    Review of Systems   Review of Systems  Respiratory:  Positive for cough.   All other systems reviewed and are negative.   Physical Exam Updated Vital Signs BP (!) 170/78   Pulse 60   Temp 99.3 F (37.4 C) (Oral)   Resp 18   Ht 4\' 10"  (1.473 m)   Wt 73.9 kg   SpO2 96%   BMI 34.05 kg/m  Physical Exam Vitals and nursing note reviewed.   77 year old female, resting comfortably and in no acute distress. Vital signs are significant for elevated blood pressure. Oxygen saturation is 96%, which is normal. Head is normocephalic and atraumatic. PERRLA, EOMI. Oropharynx is clear. Neck is nontender and supple without adenopathy. Lungs have a prolonged exhalation phase with  a few scattered wheezes.  There are no rales or rhonchi. Chest is nontender. Heart has regular rate and rhythm without murmur. Abdomen is soft, flat, nontender. Extremities have no cyanosis or edema, full range of motion is present. Skin is warm and dry without rash. Neurologic: Mental status is normal, cranial nerves are intact, moves all extremities equally.  ED Results / Procedures / Treatments   Labs (all labs ordered are listed, but only abnormal results are displayed) Labs Reviewed  SARS CORONAVIRUS 2 BY RT PCR   Radiology DG Chest 2 View  Result Date:  11/09/2022 CLINICAL DATA:  Cough EXAM: CHEST - 2 VIEW COMPARISON:  CT 04/05/2021 FINDINGS: The heart size and mediastinal contours are within normal limits. Both lungs are clear. The visualized skeletal structures are unremarkable. IMPRESSION: No active cardiopulmonary disease. Electronically Signed   By: Minerva Fester M.D.   On: 11/09/2022 20:34    Procedures Procedures  {Document cardiac monitor, telemetry assessment procedure when appropriate:1}  Medications Ordered in ED Medications - No data to display  ED Course/ Medical Decision Making/ A&P   {   Click here for ABCD2, HEART and other calculatorsREFRESH Note before signing :1}                              Medical Decision Making Amount and/or Complexity of Data Reviewed Radiology: ordered.   Respiratory tract infection which is most likely viral.  Consider influenza, COVID-19, other viral respiratory tract infections.  Doubt pneumonia, possible acute bronchitis.  Chest x-ray shows no evidence of pneumonia.  I have independently viewed the images, and agree with radiologist's interpretation.  I reviewed her laboratory test, and my interpretation is negative PCR for COVID-19.  I reviewed her past records and note urgent care visit on 10/26/2022 with diagnosis of viral URI with cough.  She did seem to have some temporary improvement with steroids and I suspect she would benefit from a longer course of steroids.  I also note some evidence of bronchospasm on exam, I have ordered a nebulizer treatment with albuterol and ipratropium.  {Document critical care time when appropriate:1} {Document review of labs and clinical decision tools ie heart score, Chads2Vasc2 etc:1}  {Document your independent review of radiology images, and any outside records:1} {Document your discussion with family members, caretakers, and with consultants:1} {Document social determinants of health affecting pt's care:1} {Document your decision making why or why not  admission, treatments were needed:1} Final Clinical Impression(s) / ED Diagnoses Final diagnoses:  None    Rx / DC Orders ED Discharge Orders     None

## 2022-11-09 NOTE — ED Notes (Signed)
RT at the bedside.

## 2022-11-10 MED ORDER — ALBUTEROL SULFATE HFA 108 (90 BASE) MCG/ACT IN AERS
2.0000 | INHALATION_SPRAY | RESPIRATORY_TRACT | Status: DC | PRN
  Administered 2022-11-10: 2 via RESPIRATORY_TRACT
  Filled 2022-11-10: qty 6.7

## 2022-11-10 MED ORDER — ALBUTEROL SULFATE HFA 108 (90 BASE) MCG/ACT IN AERS: 2.0000 | INHALATION_SPRAY | RESPIRATORY_TRACT | Status: AC | PRN

## 2022-11-10 NOTE — ED Notes (Signed)
Reviewed D/C information with the patient, pt verbalized understanding. No additional concerns at this time.

## 2022-11-10 NOTE — Discharge Instructions (Addendum)
Drink plenty of fluids.  You may continue to use Delsym as needed to help control your cough.  Please use the inhaler as needed to help with your cough and with your breathing.  The inhaler can be used 2 puffs at a time, every 4 hours.  Try to quit smoking.  Cigarette smoking makes it more difficult for your body to fight off respiratory infections like this.

## 2022-11-16 DIAGNOSIS — N390 Urinary tract infection, site not specified: Secondary | ICD-10-CM | POA: Diagnosis not present

## 2022-11-16 DIAGNOSIS — R300 Dysuria: Secondary | ICD-10-CM | POA: Diagnosis not present

## 2022-11-29 DIAGNOSIS — M1811 Unilateral primary osteoarthritis of first carpometacarpal joint, right hand: Secondary | ICD-10-CM | POA: Diagnosis not present

## 2022-12-13 DIAGNOSIS — M79641 Pain in right hand: Secondary | ICD-10-CM | POA: Diagnosis not present

## 2022-12-13 DIAGNOSIS — M1811 Unilateral primary osteoarthritis of first carpometacarpal joint, right hand: Secondary | ICD-10-CM | POA: Diagnosis not present

## 2022-12-13 DIAGNOSIS — M25641 Stiffness of right hand, not elsewhere classified: Secondary | ICD-10-CM | POA: Diagnosis not present

## 2022-12-14 ENCOUNTER — Other Ambulatory Visit: Payer: Self-pay | Admitting: Hematology

## 2022-12-14 DIAGNOSIS — D0511 Intraductal carcinoma in situ of right breast: Secondary | ICD-10-CM

## 2022-12-16 DIAGNOSIS — M25641 Stiffness of right hand, not elsewhere classified: Secondary | ICD-10-CM | POA: Diagnosis not present

## 2022-12-16 DIAGNOSIS — M79641 Pain in right hand: Secondary | ICD-10-CM | POA: Diagnosis not present

## 2022-12-20 DIAGNOSIS — M79641 Pain in right hand: Secondary | ICD-10-CM | POA: Diagnosis not present

## 2022-12-20 DIAGNOSIS — M25641 Stiffness of right hand, not elsewhere classified: Secondary | ICD-10-CM | POA: Diagnosis not present

## 2022-12-21 DIAGNOSIS — T8130XA Disruption of wound, unspecified, initial encounter: Secondary | ICD-10-CM | POA: Diagnosis not present

## 2022-12-26 DIAGNOSIS — M25641 Stiffness of right hand, not elsewhere classified: Secondary | ICD-10-CM | POA: Diagnosis not present

## 2022-12-26 DIAGNOSIS — T8130XA Disruption of wound, unspecified, initial encounter: Secondary | ICD-10-CM | POA: Diagnosis not present

## 2022-12-26 DIAGNOSIS — M79641 Pain in right hand: Secondary | ICD-10-CM | POA: Diagnosis not present

## 2022-12-26 DIAGNOSIS — M1811 Unilateral primary osteoarthritis of first carpometacarpal joint, right hand: Secondary | ICD-10-CM | POA: Diagnosis not present

## 2022-12-29 DIAGNOSIS — G4700 Insomnia, unspecified: Secondary | ICD-10-CM | POA: Diagnosis not present

## 2022-12-29 DIAGNOSIS — K219 Gastro-esophageal reflux disease without esophagitis: Secondary | ICD-10-CM | POA: Diagnosis not present

## 2022-12-29 DIAGNOSIS — L309 Dermatitis, unspecified: Secondary | ICD-10-CM | POA: Diagnosis not present

## 2022-12-29 DIAGNOSIS — I10 Essential (primary) hypertension: Secondary | ICD-10-CM | POA: Diagnosis not present

## 2022-12-29 DIAGNOSIS — E785 Hyperlipidemia, unspecified: Secondary | ICD-10-CM | POA: Diagnosis not present

## 2022-12-29 DIAGNOSIS — M1990 Unspecified osteoarthritis, unspecified site: Secondary | ICD-10-CM | POA: Diagnosis not present

## 2022-12-30 DIAGNOSIS — I10 Essential (primary) hypertension: Secondary | ICD-10-CM | POA: Diagnosis not present

## 2022-12-30 DIAGNOSIS — Z79899 Other long term (current) drug therapy: Secondary | ICD-10-CM | POA: Diagnosis not present

## 2023-01-02 DIAGNOSIS — M79641 Pain in right hand: Secondary | ICD-10-CM | POA: Diagnosis not present

## 2023-01-02 DIAGNOSIS — M25641 Stiffness of right hand, not elsewhere classified: Secondary | ICD-10-CM | POA: Diagnosis not present

## 2023-01-12 DIAGNOSIS — M25641 Stiffness of right hand, not elsewhere classified: Secondary | ICD-10-CM | POA: Diagnosis not present

## 2023-01-12 DIAGNOSIS — M79641 Pain in right hand: Secondary | ICD-10-CM | POA: Diagnosis not present

## 2023-01-13 ENCOUNTER — Telehealth: Payer: Self-pay

## 2023-01-13 NOTE — Telephone Encounter (Signed)
**Note De-Identified Iliyah Bui Obfuscation** Per [facility] Provider Portal: CPT Code: 95811-Prior Authorization/Notification is not required for the requested service(s). Decision ID #: [id number]

## 2023-01-16 ENCOUNTER — Institutional Professional Consult (permissible substitution): Payer: 59 | Admitting: Psychology

## 2023-01-16 ENCOUNTER — Ambulatory Visit: Payer: Self-pay

## 2023-01-17 ENCOUNTER — Ambulatory Visit
Admission: EM | Admit: 2023-01-17 | Discharge: 2023-01-17 | Disposition: A | Discharge status: Home / Self Care | Payer: 59 | Attending: Family Medicine | Admitting: Family Medicine

## 2023-01-17 ENCOUNTER — Other Ambulatory Visit (HOSPITAL_COMMUNITY): Payer: 59

## 2023-01-17 ENCOUNTER — Encounter (HOSPITAL_COMMUNITY): Payer: 59

## 2023-01-17 ENCOUNTER — Inpatient Hospital Stay: Payer: 59

## 2023-01-17 DIAGNOSIS — R110 Nausea: Secondary | ICD-10-CM | POA: Diagnosis not present

## 2023-01-17 DIAGNOSIS — R1310 Dysphagia, unspecified: Secondary | ICD-10-CM | POA: Diagnosis not present

## 2023-01-17 MED ORDER — LIDOCAINE VISCOUS HCL 2 % MT SOLN
15.0000 mL | Freq: Once | OROMUCOSAL | Status: AC
  Administered 2023-01-17: 15 mL via OROMUCOSAL

## 2023-01-17 MED ORDER — ONDANSETRON 4 MG PO TBDP: 4.0000 mg | ORAL_TABLET | Freq: Three times a day (TID) | ORAL | 0 refills | Status: DC | PRN

## 2023-01-17 MED ORDER — ALUM & MAG HYDROXIDE-SIMETH 200-200-20 MG/5ML PO SUSP
30.0000 mL | Freq: Once | ORAL | Status: AC
  Administered 2023-01-17: 30 mL via ORAL

## 2023-01-17 NOTE — Discharge Instructions (Addendum)
 We have given you a GI cocktail today to see if this helps with your discomfort.  I have also sent some dissolvable nausea medication to the pharmacy.  If you are still unable to eat or drink go to the emergency department for further evaluation.  Call your GI specialist as well to see what they recommend outpatient

## 2023-01-17 NOTE — ED Triage Notes (Signed)
 Pt reports nausea,and abdominal pain, with bloating x 1 day

## 2023-01-19 ENCOUNTER — Other Ambulatory Visit: Payer: Self-pay

## 2023-01-19 DIAGNOSIS — D0511 Intraductal carcinoma in situ of right breast: Secondary | ICD-10-CM

## 2023-01-19 NOTE — ED Provider Notes (Signed)
 [facility]    CSN: [id number] Arrival date & time: 01/17/23  1257      History   Chief Complaint No chief complaint on file.   HPI Cassandra Fowler is a 78 y.o. female.   Patient presenting today with 1 day history of nausea, abdominal discomfort, bloating.  She states some food got stuck going down her throat yesterday while eating and she thinks it is coming from that.  She does have a history of achalasia and is status post esophageal dilation per patient.  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  She denies vomiting, fevers, chills, urinary changes, bowel changes and is able to pass gas.  So far not tried anything over-the-counter for symptoms.  Able to tolerate water but has not tried eating solids.    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   Pre-diabetes    Pulmonary nodule, left 11/25/2020   6 mm LUL on CT 05/20/2020   Sclerosing adenosis of right breast    Tobacco use 08/05/2021    Patient Active Problem List   Diagnosis Date Noted   Engages in vaping 04/25/2022   HTN (hypertension) 04/25/2022   DOE (dyspnea on exertion) 04/25/2022   Subjective memory complaints    Pre-diabetes 03/30/2022   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   Tobacco use 08/05/2021   Family history of breast cancer 11/25/2020   Family history of colon cancer 11/25/2020   Family history of uterine cancer 11/25/2020   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   Pulmonary nodule, left 11/25/2020   Breast neoplasm, Tis (DCIS), 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   Sclerosing adenosis of right breast    Achalasia 05/13/2020   Fatty liver 05/13/2020   Abnormal gallbladder ultrasound 05/13/2020   Genetic testing 05/09/2020   Family history of lung cancer    Common bile duct dilatation 03/10/2020   Dyslipidemia 03/10/2020   Dysphagia 01/17/2020   GIST (gastrointestinal stroma tumor), malignant, 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   BREAST BIOPSY Right 11/06/2020   Procedure: BREAST BIOPSY;  Surgeon: Mavis Anes, MD;  Location: [facility];  Service: General;  Laterality: Right;   BREAST LUMPECTOMY WITH RADIOFREQUENCY TAG IDENTIFICATION Right 11/06/2020   Procedure: BREAST LUMPECTOMY WITH RADIOFREQUENCY TAG IDENTIFICATION;  Surgeon: Mavis Anes, MD;  Location: [facility];  Service: General;  Laterality: Right;    CATARACT EXTRACTION Right 03/28/2017   [facility] [place]    CATARACT EXTRACTION Left 03/14/2017   [facility] [place]    COLONOSCOPY  01/28/2013   Dr. Debby JINNY Needy, [place], [place]; Hyperplastic polyp x2, sessile serrated adenoma x2, tubular adenoma with low-grade dysplasia x1.  Recommend repeat colonoscopy in 3 years.   COLONOSCOPY WITH PROPOFOL  N/A 04/02/2020   Surgeon: Shaaron Lamar HERO, MD; Diverticulosis in the sigmoid and descending colon, cecal AVM, three 5-8 millimeters polyps in the rectum, mid rectum, and ascending colon resected and retrieved.  Pathology with 2 tubular adenomas, 1 hyperplastic polyp.  Recommended repeat colonoscopy in 5 years if health permits.   ESOPHAGOGASTRODUODENOSCOPY  04/24/2019   [place] ; 2.4 cm gastric submucosal mass in the fundus consistent with GIST.  FNA consistent with GIST.   ESOPHAGOGASTRODUODENOSCOPY  01/28/2013   Dr. Debby JINNY Devers in [place], [place]; normal esophagus, hernia at GE junction, normal examined stomach, normal examined duodenum.  Does not appear dilation was performed.   ESOPHAGOGASTRODUODENOSCOPY (EGD) WITH PROPOFOL  N/A 04/02/2020   Surgeon: Shaaron Lamar HERO, MD;  Erosive reflux esophagitis, somewhat dilated distal esophagus s/p 60 French Maloney dilation, stigmata of prior gastric surgery, no evidence of recurrent/persisting GIST, normal examined duodenum.    GIST tumor resection  06/19/2019   [facility] [place] , robotic assisted laparoscopic partial gastrectomy x2.  Pathology consistent with GIST   HELLER MYOTOMY  06/19/2019   [facility] [place] ; esophageal myotomy with hiatal hernia repair and dor fundoplication   left hand surgery     MALONEY DILATION N/A 04/02/2020   Procedure: MALONEY DILATION;  Surgeon: Shaaron Lamar HERO, MD;  Location: [facility];  Service: Endoscopy;  Laterality: N/A;   POLYPECTOMY  04/02/2020   Procedure: POLYPECTOMY;  Surgeon: Shaaron Lamar HERO, MD;  Location: [facility];  Service: Endoscopy;;   tubal ligation     UMBILICAL HERNIA REPAIR     1970s   UPPER ESOPHAGEAL ENDOSCOPIC ULTRASOUND (EUS)  04/24/2019   [facility] [place] ; endoscopic findings: Tortuous esophagus s/p balloon dilation to 18 mm with no effect s/p biopsy, submucosal mass in the fundus, normal duodenum; endosonographic findings: 2.4 cm x 2.4 cm submucosal mass in the fundus s/p FNA, cholelithiasis, no hepatic, CBD, or pancreatic abnormalities.  Pathology: GIST, hepatic parenchyma with mild steato-fibrosis, reflux esophagitis   YAG laser posterior capsulotomy Left 08/30/2018   [facility] [place] , left eye.    OB History   No obstetric history on file.      Home Medications    Prior to Admission medications   Medication Sig Start Date End Date Taking? Authorizing Provider  ondansetron  (ZOFRAN -ODT) 4 MG disintegrating tablet Take 1 tablet (4 mg total) by mouth every 8 (eight) hours as needed for nausea or vomiting. 01/17/23  Yes Stuart Vernell Norris, PA-C  acetaminophen  (TYLENOL ) 500 MG tablet Take 500 mg by mouth every 6 (six) hours as needed for moderate pain.    [provider]  albuterol  (VENTOLIN  HFA) 108 (90 Base) MCG/ACT inhaler Inhale 2 puffs into the lungs every 4 (four) hours as needed for wheezing  or shortness of breath. 11/10/22   Raford Lenis, MD  ascorbic acid (VITAMIN C) 1000 MG tablet Take 1 tablet by mouth daily.    [provider]  Aspirin-Caffeine (BAYER BACK & BODY PO) Take by mouth daily as needed.    [provider]  benzonatate  (TESSALON ) 100 MG capsule Take 1 capsule (100 mg total) by mouth every 8 (eight) hours. 11/09/22   Raford Lenis, MD  Calcium  Carbonate-Vitamin D3 600-400 MG-UNIT TABS Take 1 tablet by mouth daily.    [provider]  Cholecalciferol 50 MCG (2000 UT) TABS Take by mouth daily.    [provider]  cyanocobalamin  (VITAMIN B12) 1000 MCG tablet Take 1,000 mcg by mouth daily.     [provider]  losartan (COZAAR) 50 MG tablet Take 50 mg by mouth daily. 08/22/20   [provider]  pantoprazole  (PROTONIX ) 40 MG tablet Take 1 tablet (40 mg total) by mouth daily. 03/19/21   Rudy Josette RAMAN, PA-C  predniSONE  (DELTASONE ) 50 MG tablet Take 1 tablet (50 mg total) by mouth daily. 11/09/22   Raford Lenis, MD  Probiotic Product [facility] COLON HEALTH) CAPS Take 1 capsule by mouth daily.    [provider]  rosuvastatin  (CRESTOR ) 10 MG tablet Take 10 mg by mouth daily.    [provider]  tamoxifen  (NOLVADEX ) 20 MG tablet Take 1 tablet by mouth daily. 12/14/22   Katragadda, Sreedhar, MD  tizanidine (ZANAFLEX) 2 MG capsule Take 2 mg by mouth as needed for muscle spasms.    [provider]    Family History Family History  Problem Relation Age of Onset   Stroke Mother    Hypertension Mother    Hearing loss Mother    Cancer Mother        d. 94 where the stomach meets the intestine apple-core   Heart disease Mother    Varicose Veins Mother    Dementia Mother    Stroke Father    Heart disease Father    Hearing loss Father    Lung cancer Brother 88       smoker   Hearing loss Brother    Depression Maternal Grandmother    Diabetes Maternal Grandfather    Colon cancer Paternal Grandmother        dx in her 62s   Hypertension Daughter    Heart disease Daughter    Hearing loss Daughter    Diabetes Daughter    Depression Daughter    Vaginal cancer Daughter        dx <53   Uterine cancer Daughter        dx <53   Breast cancer Daughter        dx <53   Drug abuse Daughter    Kidney disease Daughter    Miscarriages / Stillbirths Daughter    Breast cancer Maternal Aunt        unknown age of diagnosis   Breast cancer Maternal Aunt        unknown age of diagnosis   Breast cancer Cousin        unknown age of diagnosis (maternal first cousin)   Breast cancer Cousin        unknown age of diagnosis (maternal first cousin)    Miscarriages / Stillbirths Granddaughter    Hearing loss Granddaughter    Drug abuse Granddaughter    Depression Granddaughter     Social History Social History   Tobacco Use   Smoking status: Every Day  Types: E-cigarettes   Smokeless tobacco: Never   Tobacco comments:    Daily vaping; 1 cartridge lasts about a month  Vaping Use   Vaping status: Never Used  Substance Use Topics   Alcohol use: Not Currently    Comment: glass of wine occ   Drug use: Never     Allergies   Penicillins, Somatropin, Tilactase, Tape, Indomethacin, Tolectin [tolmetin], Sulfa antibiotics, and Wound dressing adhesive   Review of Systems Review of Systems Per HPI  Physical Exam Triage Vital Signs ED Triage Vitals  Encounter Vitals Group     BP 01/17/23 1303 (!) 151/85     Systolic BP Percentile --      Diastolic BP Percentile --      Pulse Rate 01/17/23 1303 72     Resp 01/17/23 1303 18     Temp 01/17/23 1303 98.5 F (36.9 C)     Temp Source 01/17/23 1303 Oral     SpO2 01/17/23 1303 94 %     Weight --      Height --      Head Circumference --      Peak Flow --      Pain Score 01/17/23 1306 5     Pain Loc --      Pain Education --      Exclude from Growth Chart --    No data found.  Updated Vital Signs BP (!) 151/85 (BP Location: Right Arm)   Pulse 72   Temp 98.5 F (36.9 C) (Oral)   Resp 18   SpO2 94%   Visual Acuity Right Eye Distance:   Left Eye Distance:   Bilateral Distance:    Right Eye Near:   Left Eye Near:    Bilateral Near:     Physical Exam Vitals and nursing note reviewed.  Constitutional:      Appearance: Normal appearance. She is not ill-appearing.  HENT:     Head: Atraumatic.     Mouth/Throat:     Mouth: Mucous membranes are moist.     Pharynx: Oropharynx is clear. No posterior oropharyngeal erythema.  Eyes:     Extraocular Movements: Extraocular movements intact.     Conjunctiva/sclera: Conjunctivae normal.  Neck:     Comments: No  thyromegaly palpable Cardiovascular:     Rate and Rhythm: Normal rate and regular rhythm.     Heart sounds: Normal heart sounds.  Pulmonary:     Effort: Pulmonary effort is normal.     Breath sounds: Normal breath sounds.  Abdominal:     General: Bowel sounds are normal. There is no distension.     Palpations: Abdomen is soft.     Tenderness: There is no abdominal tenderness. There is no right CVA tenderness, left CVA tenderness or guarding.  Musculoskeletal:        General: Normal range of motion.     Cervical back: Normal range of motion and neck supple. No tenderness.  Lymphadenopathy:     Cervical: No cervical adenopathy.  Skin:    General: Skin is warm and dry.  Neurological:     Mental Status: She is alert and oriented to person, place, and time.  Psychiatric:        Mood and Affect: Mood normal.        Thought Content: Thought content normal.        Judgment: Judgment normal.      UC Treatments / Results  Labs (all labs ordered are listed, but only abnormal  results are displayed) Labs Reviewed - No data to display  EKG   Radiology No results found.  Procedures Procedures (including critical care time)  Medications Ordered in UC Medications  alum & mag hydroxide-simeth (MAALOX/MYLANTA) 200-200-20 MG/5ML suspension 30 mL (30 mLs Oral Given 01/17/23 1342)  lidocaine  (XYLOCAINE ) 2 % viscous mouth solution 15 mL (15 mLs Mouth/Throat Given 01/17/23 1342)    Initial Impression / Assessment and Plan / UC Course  I have reviewed the triage vital signs and the nursing notes.  Pertinent labs & imaging results that were available during my care of the patient were reviewed by me and considered in my medical decision making (see chart for details).     Unclear if related to history of esophageal issues or more related to bowel issues but will trial GI cocktail, Zofran  as needed, soft diet and follow-up with GI specialist as soon as possible.  ED if unable to tolerate  fluids and soft foods or if symptoms significantly worsening at any time. Final Clinical Impressions(s) / UC Diagnoses   Final diagnoses:  Nausea without vomiting  Dysphagia, unspecified type     Discharge Instructions      We have given you a GI cocktail today to see if this helps with your discomfort.  I have also sent some dissolvable nausea medication to the pharmacy.  If you are still unable to eat or drink go to the emergency department for further evaluation.  Call your GI specialist as well to see what they recommend outpatient   ED Prescriptions     Medication Sig Dispense Auth. Provider   ondansetron  (ZOFRAN -ODT) 4 MG disintegrating tablet Take 1 tablet (4 mg total) by mouth every 8 (eight) hours as needed for nausea or vomiting. 20 tablet Stuart Vernell Norris, [place]      PDMP not reviewed this encounter.   Stuart Vernell Norris, [place] 01/19/23 1343

## 2023-01-20 ENCOUNTER — Inpatient Hospital Stay: Payer: 59 | Attending: Hematology

## 2023-01-20 DIAGNOSIS — Z79810 Long term (current) use of selective estrogen receptor modulators (SERMs): Secondary | ICD-10-CM | POA: Insufficient documentation

## 2023-01-20 DIAGNOSIS — D0511 Intraductal carcinoma in situ of right breast: Secondary | ICD-10-CM | POA: Diagnosis not present

## 2023-01-20 DIAGNOSIS — M85852 Other specified disorders of bone density and structure, left thigh: Secondary | ICD-10-CM | POA: Insufficient documentation

## 2023-01-20 LAB — CBC WITH DIFFERENTIAL/PLATELET
Abs Immature Granulocytes: 0.03 10*3/uL (ref 0.00–0.07)
Basophils Absolute: 0.1 10*3/uL (ref 0.0–0.1)
Basophils Relative: 0 %
Eosinophils Absolute: 0.4 10*3/uL (ref 0.0–0.5)
Eosinophils Relative: 4 %
HCT: 41.5 % (ref 36.0–46.0)
Hemoglobin: 13.6 g/dL (ref 12.0–15.0)
Immature Granulocytes: 0 %
Lymphocytes Relative: 26 %
Lymphs Abs: 3 10*3/uL (ref 0.7–4.0)
MCH: 30.5 pg (ref 26.0–34.0)
MCHC: 32.8 g/dL (ref 30.0–36.0)
MCV: 93 fL (ref 80.0–100.0)
Monocytes Absolute: 0.9 10*3/uL (ref 0.1–1.0)
Monocytes Relative: 8 %
Neutro Abs: 6.9 10*3/uL (ref 1.7–7.7)
Neutrophils Relative %: 62 %
Platelets: 243 10*3/uL (ref 150–400)
RBC: 4.46 MIL/uL (ref 3.87–5.11)
RDW: 13.2 % (ref 11.5–15.5)
WBC: 11.3 10*3/uL — ABNORMAL HIGH (ref 4.0–10.5)
nRBC: 0 % (ref 0.0–0.2)

## 2023-01-20 LAB — COMPREHENSIVE METABOLIC PANEL
ALT: 14 U/L (ref 0–44)
AST: 14 U/L — ABNORMAL LOW (ref 15–41)
Albumin: 4.1 g/dL (ref 3.5–5.0)
Alkaline Phosphatase: 45 U/L (ref 38–126)
Anion gap: 9 (ref 5–15)
BUN: 11 mg/dL (ref 8–23)
CO2: 27 mmol/L (ref 22–32)
Calcium: 9.5 mg/dL (ref 8.9–10.3)
Chloride: 102 mmol/L (ref 98–111)
Creatinine, Ser: 0.71 mg/dL (ref 0.44–1.00)
GFR, Estimated: >60 mL/min (ref >60)
Glucose, Bld: 101 mg/dL — ABNORMAL HIGH (ref 70–99)
Potassium: 3.7 mmol/L (ref 3.5–5.1)
Sodium: 138 mmol/L (ref 135–145)
Total Bilirubin: 0.5 mg/dL (ref 0.0–1.2)
Total Protein: 6.7 g/dL (ref 6.5–8.1)

## 2023-01-20 LAB — VITAMIN D 25 HYDROXY (VIT D DEFICIENCY, FRACTURES): Vit D, 25-Hydroxy: 40.42 ng/mL (ref 30–100)

## 2023-01-23 ENCOUNTER — Encounter: Payer: 59 | Admitting: Psychology

## 2023-01-24 ENCOUNTER — Inpatient Hospital Stay: Payer: 59 | Admitting: Hematology

## 2023-02-14 DIAGNOSIS — M1811 Unilateral primary osteoarthritis of first carpometacarpal joint, right hand: Secondary | ICD-10-CM | POA: Diagnosis not present

## 2023-02-24 ENCOUNTER — Encounter: Payer: Self-pay | Admitting: Physician Assistant

## 2023-02-24 ENCOUNTER — Ambulatory Visit (INDEPENDENT_AMBULATORY_CARE_PROVIDER_SITE_OTHER): Payer: 59 | Admitting: Physician Assistant

## 2023-02-24 VITALS — BP 98/67 | HR 88 | Resp 20 | Ht <= 58 in | Wt 161.0 lb

## 2023-02-24 DIAGNOSIS — R413 Other amnesia: Secondary | ICD-10-CM | POA: Diagnosis not present

## 2023-02-24 DIAGNOSIS — R4189 Other symptoms and signs involving cognitive functions and awareness: Secondary | ICD-10-CM

## 2023-02-24 NOTE — Progress Notes (Signed)
Assessment/Plan:   Cognitive Deficits   Cassandra Fowler is a delightful 78 y.o. LH female with a history of hypertension, hyperlipidemia,  R breast cancer on tamoxifen, GERD, GIST, Prediabetes, Depression, with a recent neuropsychological evaluation with a functioning generally within normal limits, with overall performance not suggestive of Alzheimer's disease or any other form of neurodegenerative illness at the present. Personally reviewed MRI of the brain 04/15/2022 without evidence of acute intracranial abnormalities, remarkable for mild chronic small vessel ischemic changes within the cerebral white matter, mild generalized parenchymal atrophy presenting today in follow-up for evaluation of memory loss.  MMSE today is again 30/30.  Patient is not on antidementia medication as this is not indicated.  She is able to participate on her ADLs with some guidance of her daughter.      Recommendations:   Follow up in 6 months Repeat neuropsych evaluation in 1 year   Recommend good control of cardiovascular risk factors Recommend addressing chronic pain, ongoing sleep dysfunction, psychological stressors, hearing loss issues with PCP and other specialties.  She has an upcoming sleep study soon Continue to control mood as per PCP    Subjective:   This patient is accompanied in the office by her daughter  who supplements the history. Previous records as well as any outside records available were reviewed prior to todays visit.   Patient was last seen on 06/24/2022, last MoCA 03/26/2022 at 25/30     Any changes in memory since last visit? " Some".  There are some difficulties remembering recent conversations and names.  She is not doing more than TV and sleeping, not doing any activities than before such as  drawing and painting, crossword puzzles, word finding, or diamond and art painting. Does not like using her hearing aids  repeats oneself?  Endorsed "constantly "-her daughter says.  Patient  responds that her daughter talks too low. Disoriented when walking into a room?  Patient denies    Misplacing objects?  Endorsed, all over the place.   Wandering behavior?   denies   Any personality changes since last visit?   "She is still sore, argumentative as before".  Any worsening depression?:  She has undiagnosed depression and going to the daughter, "depression runs in the family ".  She has never done psychotherapy Hallucinations or paranoia? "I have spirits in the house "she says, "family member lives with them"-daughter says. Seizures?   denies    Any sleep changes? Does not sleep very well, reporting vivid dreams, denies REM behavior or sleepwalking. She has upcoming CPAP study in the next 2 week.  Sleep apnea?   "Possibly" Any hygiene concerns?   She needs to be reminded, "2 weeks past by and she may not take a shower ". Independent of bathing and dressing?  Endorsed  Does the patient needs help with medications?  Daughter is in charge   Who is in charge of the finances?  Daughter is in charge but patient participates.     Any changes in appetite?  denies     Patient have trouble swallowing?  She has a history of esophageal stricture, receives periodic Botox. Does the patient cook?  Yes, denies forgetting any common recipes.  Any kitchen accidents such as leaving the stove on?   denies   Any headaches?    denies   Vision changes? denies Chronic pain?  Endorsed, due to arthritis. Ambulates with difficulty?  She does not like to use the walker for stability, has a history of  arthritis which may limit her mobility, she needs help with buttoning  Recent falls or head injuries?    denies      Unilateral weakness, numbness or tingling?   denies   Any tremors?  denies   Any anosmia?    denies   Any incontinence of urine?  She has a history of urge incontinence, wears Depends Any bowel dysfunction?  denies      Patient lives with her daughter  Does the patient drive?  Yes, very short  distances, less than before    Initial evaluation 03/25/2022  How long did patient have memory difficulties?  About 3 years. Patient has some difficulty remembering recent conversations and people names, although is able to participate in most activities.  She has sometimes trouble recognizing people, or remembering appointments and medications.  Her LTM is better than the short-term memory.  She enjoys doing crossword puzzles and word finding.  She also enjoys drawing and painting. repeats oneself?  Endorsed Disoriented when walking into a room?  Patient denies, trying to adapt to the new home, as they recently moved from [place].   Leaving objects in unusual places?  Endorsed, for example losing the phone and finding it in the microwave, coffee cups in the oven etc.  Wandering behavior? denies   Any personality changes since last visit?  Patient denies, but daughter reports that she has more mood swings for the last 1-1/2 years, and she can get argumentative.   Any history of depression?: Endorsed, "depression runs in the family ", although she has not been formally diagnosed or treated.  She does not have a psychotherapist. Hallucinations or paranoia?  denies   Seizures? denies    Any sleep changes?  "Most of the time "-daughter says. Denies  vivid dreams, REM behavior or sleepwalking   Sleep apnea? "They are going  to check on that because she snores very loud" Any hygiene concerns? Endorsed for the last 1.5 y, needs reminder, "2 weeks come back by and she does not take a shower ".  Independent of bathing and dressing?  Endorsed, has arthritis so she needs help with buttoning.  Does the patient need help with medications?  Daughter is in charge over the last 6 months because she was missing doses. She is now on pillpack's  Who is in charge of the finances?  Daughter is in charge over the last 5 months because she was missing payments Any changes in appetite?  Eats well, she may forget to eat at  times, but she may be able to snack.    Patient have trouble swallowing?  denies   Does the patient cook?  Any kitchen accidents such as leaving the stove on? Patient denies   Any headaches? She has GERD and achalasia so she had esophageal dilatation and  Botox injections with good results  Chronic  pain?  Endorsed due to arthritis  Ambulates with difficulty? Uses a walker and cane for stability. She is going to try PT as OP  Recent falls or head injuries? denies     Vision changes? Had "scar tissue on the R after cataracts, L has retinal detachment history, defers eye surgery "because she has to sleep on her stomach  for 30 days and does not want to do it.   Unilateral weakness, numbness or tingling?  denies   Any tremors?  Denies  Any anosmia? "Always been off" Any incontinence of urine?Endorsed, uses Depends  Any bowel dysfunction?    denies  Patient lives  daughter  History of heavy alcohol intake? denies   History of heavy tobacco use? denies   Family history of dementia?  Her mother had dementia ?type  Dose patient drive? short distances, only during the day, not frequently , lately she does not feel comfortable anymore    Neuropsychological evaluation on 03/31/2022. Briefly, results suggested neuropsychological functioning generally within normal limits relative to age-matched peers. Some performance variability was exhibited across executive functioning, particularly surrounding cognitive flexibility and response inhibition. Performances across tasks assessing verbal reasoning and safety/judgment were appropriate. Performances were also appropriate across processing speed, attention/concentration, receptive and expressive language, visuospatial abilities, and all aspects of learning and memory.    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   BREAST BIOPSY Right 11/06/2020   Procedure: BREAST BIOPSY;  Surgeon: Franky Macho, MD;  Location: [facility];  Service: General;  Laterality: Right;   BREAST LUMPECTOMY WITH RADIOFREQUENCY TAG IDENTIFICATION Right 11/06/2020   Procedure: BREAST LUMPECTOMY WITH RADIOFREQUENCY TAG IDENTIFICATION;  Surgeon: Franky Macho, MD;  Location: [facility];  Service: General;  Laterality: Right;    CATARACT EXTRACTION Right 03/28/2017   [facility]   CATARACT EXTRACTION Left 03/14/2017   [facility]   COLONOSCOPY  01/28/2013   Dr. Stacey Drain, [place], [place]; Hyperplastic polyp x2, sessile serrated adenoma x2, tubular adenoma with low-grade dysplasia x1.  Recommend repeat colonoscopy in 3 years.   COLONOSCOPY WITH PROPOFOL N/A 04/02/2020   Surgeon: Corbin Ade, MD; Diverticulosis in the sigmoid and descending colon, cecal AVM, three 5-8 millimeters polyps in the rectum, mid rectum, and ascending colon resected and retrieved.  Pathology with 2 tubular adenomas, 1 hyperplastic polyp.  Recommended repeat colonoscopy in 5 years if health permits.   ESOPHAGOGASTRODUODENOSCOPY  04/24/2019  [place]; 2.4 cm gastric submucosal mass in the fundus consistent with GIST.  FNA consistent with GIST.   ESOPHAGOGASTRODUODENOSCOPY  01/28/2013   Dr. Ihor Austin Devers in [place], [place]; normal esophagus, hernia at GE junction, normal examined stomach, normal examined duodenum.  Does not appear dilation was performed.   ESOPHAGOGASTRODUODENOSCOPY (EGD) WITH PROPOFOL N/A 04/02/2020   Surgeon: Corbin Ade, MD;  Erosive reflux esophagitis, somewhat dilated distal esophagus s/p 60 French Maloney dilation, stigmata of prior gastric surgery, no evidence of recurrent/persisting GIST, normal examined duodenum.    GIST tumor resection  06/19/2019   [facility], robotic assisted laparoscopic partial gastrectomy x2.  Pathology consistent with GIST   HELLER MYOTOMY  06/19/2019   [facility]; esophageal myotomy with hiatal hernia repair and dor fundoplication   left hand surgery     MALONEY DILATION N/A 04/02/2020   Procedure: Elease Hashimoto DILATION;  Surgeon: Corbin Ade, MD;  Location: [facility];  Service: Endoscopy;  Laterality: N/A;   POLYPECTOMY  04/02/2020   Procedure: POLYPECTOMY;  Surgeon: Corbin Ade, MD;  Location: [facility];  Service: Endoscopy;;   tubal ligation     UMBILICAL HERNIA REPAIR     1970s   UPPER ESOPHAGEAL ENDOSCOPIC ULTRASOUND (EUS)  04/24/2019   [facility]; endoscopic findings: Tortuous esophagus s/p balloon dilation to 18 mm with no effect s/p biopsy, submucosal mass in the fundus, normal duodenum; endosonographic findings: 2.4 cm x 2.4 cm submucosal mass in the fundus s/p FNA, cholelithiasis, no hepatic, CBD, or pancreatic abnormalities.  Pathology: GIST, hepatic parenchyma with mild steato-fibrosis, reflux esophagitis   YAG laser posterior capsulotomy Left 08/30/2018   [facility], left eye.     PREVIOUS MEDICATIONS:   CURRENT MEDICATIONS:  Outpatient Encounter Medications as of 02/24/2023  Medication Sig   acetaminophen (TYLENOL) 500 MG tablet Take 500 mg by mouth every 6 (six) hours as needed for moderate pain.   albuterol (VENTOLIN HFA) 108 (90 Base) MCG/ACT inhaler Inhale 2 puffs into the lungs every 4 (four) hours as needed for wheezing or shortness of breath.   ascorbic acid (VITAMIN C) 1000 MG tablet Take 1 tablet by mouth daily.   Aspirin-Caffeine (BAYER BACK & BODY PO) Take by mouth daily as needed.   baclofen (LIORESAL) 10 MG tablet    benzonatate (TESSALON) 100 MG capsule Take 1 capsule (100 mg total) by mouth every 8 (eight) hours.   Calcium Carbonate-Vitamin D3 600-400 MG-UNIT TABS Take 1 tablet by mouth daily.   Cholecalciferol 50 MCG (2000 UT) TABS Take by mouth daily.   cyanocobalamin (VITAMIN B12) 1000 MCG tablet Take 1,000 mcg by mouth daily.   losartan (COZAAR) 50 MG tablet Take 50 mg by mouth daily.   ondansetron (ZOFRAN-ODT) 4 MG disintegrating tablet Take 1 tablet (4 mg total) by mouth every 8 (eight) hours as needed for nausea or vomiting.   pantoprazole (PROTONIX) 40 MG tablet Take 1 tablet (40 mg total) by mouth daily.   predniSONE (DELTASONE) 50 MG tablet Take 1 tablet (50 mg total) by mouth daily.   Probiotic Product  (PHILLIPS COLON HEALTH) CAPS Take 1 capsule by mouth daily.   rosuvastatin (CRESTOR) 10 MG tablet Take 10 mg by mouth daily.   tamoxifen (NOLVADEX) 20 MG tablet Take 1 tablet by mouth daily.   tizanidine (ZANAFLEX) 2 MG capsule Take 2 mg by mouth as needed for muscle spasms.   No facility-administered encounter medications on file as of 02/24/2023.  Objective:     PHYSICAL EXAMINATION:    VITALS:   Vitals:   02/24/23 0901  BP: 98/67  Pulse: 88  Resp: 20  SpO2: 96%  Weight: 161 lb (73 kg)  Height: 4\' 10"  (1.473 m)    GEN:  The patient appears stated age and is in NAD. HEENT:  Normocephalic, atraumatic.   Neurological examination:  General: NAD, well-groomed, appears stated age. Orientation: The patient is alert. Oriented to person, place and date Cranial nerves: There is good facial symmetry. edentulous. The speech is fluent and clear. No aphasia or dysarthria. Fund of knowledge is appropriate. Recent and remote memory is normal.  Attention and concentration are normal.  Able to name objects and repeat phrases.  Hearing is intact to conversational tone .   Delayed recall 3/3 Sensation: Sensation is intact to light touch throughout Motor: Strength is at least antigravity x4. DTR's 2/4 in UE/LE      03/25/2022    8:00 AM  Montreal Cognitive Assessment   Visuospatial/ Executive (0/5) 4  Naming (0/3) 3  Attention: Read list of digits (0/2) 2  Attention: Read list of letters (0/1) 1  Attention: Serial 7 subtraction starting at 100 (0/3) 3  Language: Repeat phrase (0/2) 2  Language : Fluency (0/1) 1  Abstraction (0/2) 0  Delayed Recall (0/5) 3  Orientation (0/6) 6  Total 25  Adjusted Score (based on education) 25       02/24/2023   12:00 PM  MMSE - Mini Mental State Exam  Orientation to time 5  Orientation to Place 5  Registration 3  Attention/ Calculation 5  Recall 3  Language- name 2 objects 2  Language- repeat 1  Language- follow 3 step command 3   Language- read & follow direction 1  Write a sentence 1  Copy design 1  Total score 30       Movement examination: Tone: There is normal tone in the UE/LE Abnormal movements:  no tremor.  No myoclonus.  No asterixis.   Coordination:  There is no decremation with RAM's. Normal finger to nose.  Severe arthritic changes noted in her hands. Gait and Station: The patient has no difficulty arising out of a deep-seated chair without the use of the hands. The patient's stride length is good.  Gait is cautious and narrow.   Thank you for allowing [place] the opportunity to participate in the care of this nice patient. Please do not hesitate to contact us for any questions or concerns.   Total time spent on today's visit was 26 minutes dedicated to this patient today, preparing to see patient, examining the patient, ordering tests and/or medications and counseling the patient, documenting clinical information in the EHR or other health record, independently interpreting results and communicating results to the patient/family, discussing treatment and goals, answering patient's questions and coordinating care.  Cc:  Wilmon Pali, FNP  Marlowe Kays 02/24/2023 12:27 PM

## 2023-02-24 NOTE — Patient Instructions (Addendum)
It was a pleasure to see you today at our office.   Recommendations:  Neurocognitive evaluation at our office in 1 year Use the hearing aids  Agree with the sleep studies  Follow up August 15 at 9:30 am   Whom to call:  Memory  decline, memory medications: Call our office [phone]   For psychiatric meds, mood meds: Please have your primary care physician manage these medications.       For assessment of decision of mental capacity and competency:  Call Dr. Erick Blinks, geriatric psychiatrist at [phone]  For guidance in geriatric dementia issues please call [facility] [phone]     If you have any severe symptoms of a stroke, or other severe issues such as confusion,severe chills or fever, etc call 911 or go to the ER as you may need to be evaluated further        You have been referred for a neuropsychological evaluation (i.e., evaluation of memory and thinking abilities). Please bring someone with you to this appointment if possible, as it is helpful for the doctor to hear from both you and another adult who knows you well. Please bring eyeglasses and hearing aids if you wear them.    The evaluation will take approximately 3 hours and has two parts:   The first part is a clinical interview with the neuropsychologist (Dr. Milbert Coulter or Dr. Roseanne Reno). During the interview, the neuropsychologist will speak with you and the individual you brought to the appointment.    The second part of the evaluation is testing with the doctor's technician Annabelle Harman or Selena Batten). During the testing, the technician will ask you to remember different types of material, solve problems, and answer some questionnaires. Your family member will not be present for this portion of the evaluation.   Please note: We must reserve several hours of the neuropsychologist's time and the psychometrician's time for your evaluation appointment. As such, there is a No-Show fee of $100. If you are unable to  attend any of your appointments, please contact our office as soon as possible to reschedule.      RECOMMENDATIONS FOR ALL PATIENTS WITH MEMORY PROBLEMS: 1. Continue to exercise (Recommend 30 minutes of walking everyday, or 3 hours every week) 2. Increase social interactions - continue going to [place] and enjoy social gatherings with friends and family 3. Eat healthy, avoid fried foods and eat more fruits and vegetables 4. Maintain adequate blood pressure, blood sugar, and blood cholesterol level. Reducing the risk of stroke and cardiovascular disease also helps promoting better memory. 5. Avoid stressful situations. Live a simple life and avoid aggravations. Organize your time and prepare for the next day in anticipation. 6. Sleep well, avoid any interruptions of sleep and avoid any distractions in the bedroom that may interfere with adequate sleep quality 7. Avoid sugar, avoid sweets as there is a strong link between excessive sugar intake, diabetes, and cognitive impairment We discussed the Mediterranean diet, which has been shown to help patients reduce the risk of progressive memory disorders and reduces cardiovascular risk. This includes eating fish, eat fruits and green leafy vegetables, nuts like almonds and hazelnuts, walnuts, and also use olive oil. Avoid fast foods and fried foods as much as possible. Avoid sweets and sugar as sugar use has been linked to worsening of memory function.  There is always a concern of gradual progression of memory problems. If this is the case, then we may need to adjust level of care according to  patient needs. Support, both to the patient and caregiver, should then be put into place.      FALL PRECAUTIONS: Be cautious when walking. Scan the area for obstacles that may increase the risk of trips and falls. When getting up in the mornings, sit up at the edge of the bed for a few minutes before getting out of bed. Consider elevating the bed at the head end  to avoid drop of blood pressure when getting up. Walk always in a well-lit room (use night lights in the walls). Avoid area rugs or power cords from appliances in the middle of the walkways. Use a walker or a cane if necessary and consider physical therapy for balance exercise. Get your eyesight checked regularly.  FINANCIAL OVERSIGHT: Supervision, especially oversight when making financial decisions or transactions is also recommended.  HOME SAFETY: Consider the safety of the kitchen when operating appliances like stoves, microwave oven, and blender. Consider having supervision and share cooking responsibilities until no longer able to participate in those. Accidents with firearms and other hazards in the house should be identified and addressed as well.   ABILITY TO BE LEFT ALONE: If patient is unable to contact 911 operator, consider using LifeLine, or when the need is there, arrange for someone to stay with patients. Smoking is a fire hazard, consider supervision or cessation. Risk of wandering should be assessed by caregiver and if detected at any point, supervision and safe proof recommendations should be instituted.  MEDICATION SUPERVISION: Inability to self-administer medication needs to be constantly addressed. Implement a mechanism to ensure safe administration of the medications.   DRIVING: Regarding driving, in patients with progressive memory problems, driving will be impaired. We advise to have someone else do the driving if trouble finding directions or if minor accidents are reported. Independent driving assessment is available to determine safety of driving.   If you are interested in the driving assessment, you can contact the following:  [facility] [employer] [facility] in [place] [phone]  [facility] [phone]  [facility] [phone] or [phone]    Mediterranean Diet A Mediterranean diet refers to  food and lifestyle choices that are based on the traditions of countries located on the [employer]. This way of eating has been shown to help prevent certain conditions and improve outcomes for people who have chronic diseases, like kidney disease and heart disease. What are tips for following this plan? Lifestyle  Cook and eat meals together with your family, when possible. Drink enough fluid to keep your urine clear or pale yellow. Be physically active every day. This includes: Aerobic exercise like running or swimming. Leisure activities like gardening, walking, or housework. Get 7-8 hours of sleep each night. If recommended by your health care provider, drink red wine in moderation. This means 1 glass a day for nonpregnant women and 2 glasses a day for men. A glass of wine equals 5 oz (150 mL). Reading food labels  Check the serving size of packaged foods. For foods such as rice and pasta, the serving size refers to the amount of cooked product, not dry. Check the total fat in packaged foods. Avoid foods that have saturated fat or trans fats. Check the ingredients list for added sugars, such as corn syrup. Shopping  At the grocery store, buy most of your food from the areas near the walls of the store. This includes: Fresh fruits and vegetables (produce). Grains, beans, nuts, and seeds. Some of these may  be available in unpackaged forms or large amounts (in bulk). Fresh seafood. Poultry and eggs. Low-fat dairy products. Buy whole ingredients instead of prepackaged foods. Buy fresh fruits and vegetables in-season from local farmers markets. Buy frozen fruits and vegetables in resealable bags. If you do not have access to quality fresh seafood, buy precooked frozen shrimp or canned fish, such as tuna, salmon, or sardines. Buy small amounts of raw or cooked vegetables, salads, or olives from the deli or salad bar at your store. Stock your pantry so you always have certain foods on  hand, such as olive oil, canned tuna, canned tomatoes, rice, pasta, and beans. Cooking  Cook foods with extra-virgin olive oil instead of using butter or other vegetable oils. Have meat as a side dish, and have vegetables or grains as your main dish. This means having meat in small portions or adding small amounts of meat to foods like pasta or stew. Use beans or vegetables instead of meat in common dishes like chili or lasagna. Experiment with different cooking methods. Try roasting or broiling vegetables instead of steaming or sauteing them. Add frozen vegetables to soups, stews, pasta, or rice. Add nuts or seeds for added healthy fat at each meal. You can add these to yogurt, salads, or vegetable dishes. Marinate fish or vegetables using olive oil, lemon juice, garlic, and fresh herbs. Meal planning  Plan to eat 1 vegetarian meal one day each week. Try to work up to 2 vegetarian meals, if possible. Eat seafood 2 or more times a week. Have healthy snacks readily available, such as: Vegetable sticks with hummus. Greek yogurt. Fruit and nut trail mix. Eat balanced meals throughout the week. This includes: Fruit: 2-3 servings a day Vegetables: 4-5 servings a day Low-fat dairy: 2 servings a day Fish, poultry, or lean meat: 1 serving a day Beans and legumes: 2 or more servings a week Nuts and seeds: 1-2 servings a day Whole grains: 6-8 servings a day Extra-virgin olive oil: 3-4 servings a day Limit red meat and sweets to only a few servings a month What are my food choices? Mediterranean diet Recommended Grains: Whole-grain pasta. Brown rice. Bulgar wheat. Polenta. Couscous. Whole-wheat bread. Orpah Cobb. Vegetables: Artichokes. Beets. Broccoli. Cabbage. Carrots. Eggplant. Green beans. Chard. Kale. Spinach. Onions. Leeks. Peas. Squash. Tomatoes. Peppers. Radishes. Fruits: Apples. Apricots. Avocado. Berries. Bananas. Cherries. Dates. Figs. Grapes. Lemons. Melon. Oranges. Peaches.  Plums. Pomegranate. Meats and other protein foods: Beans. Almonds. Sunflower seeds. Pine nuts. Peanuts. Cod. Salmon. Scallops. Shrimp. Tuna. Tilapia. Clams. Oysters. Eggs. Dairy: Low-fat milk. Cheese. Greek yogurt. Beverages: Water. Red wine. Herbal tea. Fats and oils: Extra virgin olive oil. Avocado oil. Grape seed oil. Sweets and desserts: [place] yogurt with honey. Baked apples. Poached pears. Trail mix. Seasoning and other foods: Basil. Cilantro. Coriander. Cumin. Mint. Parsley. Sage. Rosemary. Tarragon. Garlic. Oregano. Thyme. Pepper. Balsalmic vinegar. Tahini. Hummus. Tomato sauce. Olives. Mushrooms. Limit these Grains: Prepackaged pasta or rice dishes. Prepackaged cereal with added sugar. Vegetables: Deep fried potatoes (french fries). Fruits: Fruit canned in syrup. Meats and other protein foods: Beef. Pork. Lamb. Poultry with skin. Hot dogs. Tomasa Blase. Dairy: Ice cream. Sour cream. Whole milk. Beverages: Juice. Sugar-sweetened soft drinks. Beer. Liquor and spirits. Fats and oils: Butter. Canola oil. Vegetable oil. Beef fat (tallow). Lard. Sweets and desserts: Cookies. Cakes. Pies. Candy. Seasoning and other foods: Mayonnaise. Premade sauces and marinades. The items listed may not be a complete list. Talk with your dietitian about what dietary choices are right for you. Summary  The Mediterranean diet includes both food and lifestyle choices. Eat a variety of fresh fruits and vegetables, beans, nuts, seeds, and whole grains. Limit the amount of red meat and sweets that you eat. Talk with your health care provider about whether it is safe for you to drink red wine in moderation. This means 1 glass a day for nonpregnant women and 2 glasses a day for men. A glass of wine equals 5 oz (150 mL). This information is not intended to replace advice given to you by your health care provider. Make sure you discuss any questions you have with your health care provider. Document Released: 08/20/2015  Document Revised: 09/22/2015 Document Reviewed: 08/20/2015 Elsevier Interactive Patient Education  2017 [employer].   [address] [facility] [phone]

## 2023-02-24 NOTE — Addendum Note (Signed)
Addended by: Marlowe Kays E on: 02/24/2023 12:33 PM   Modules accepted: Level of Service

## 2023-02-28 ENCOUNTER — Ambulatory Visit (HOSPITAL_COMMUNITY)
Admission: RE | Admit: 2023-02-28 | Discharge: 2023-02-28 | Disposition: A | Discharge status: Home / Self Care | Payer: 59 | Source: Ambulatory Visit | Attending: Hematology | Admitting: Hematology

## 2023-02-28 DIAGNOSIS — K760 Fatty (change of) liver, not elsewhere classified: Secondary | ICD-10-CM | POA: Diagnosis not present

## 2023-02-28 DIAGNOSIS — R92323 Mammographic fibroglandular density, bilateral breasts: Secondary | ICD-10-CM | POA: Diagnosis not present

## 2023-02-28 DIAGNOSIS — N6321 Unspecified lump in the left breast, upper outer quadrant: Secondary | ICD-10-CM | POA: Insufficient documentation

## 2023-02-28 DIAGNOSIS — K5730 Diverticulosis of large intestine without perforation or abscess without bleeding: Secondary | ICD-10-CM | POA: Insufficient documentation

## 2023-02-28 DIAGNOSIS — C49A4 Gastrointestinal stromal tumor of large intestine: Secondary | ICD-10-CM | POA: Diagnosis not present

## 2023-02-28 DIAGNOSIS — M8589 Other specified disorders of bone density and structure, multiple sites: Secondary | ICD-10-CM | POA: Insufficient documentation

## 2023-02-28 DIAGNOSIS — Z780 Asymptomatic menopausal state: Secondary | ICD-10-CM | POA: Diagnosis not present

## 2023-02-28 DIAGNOSIS — D0511 Intraductal carcinoma in situ of right breast: Secondary | ICD-10-CM | POA: Insufficient documentation

## 2023-02-28 DIAGNOSIS — L988 Other specified disorders of the skin and subcutaneous tissue: Secondary | ICD-10-CM | POA: Insufficient documentation

## 2023-02-28 DIAGNOSIS — F17200 Nicotine dependence, unspecified, uncomplicated: Secondary | ICD-10-CM | POA: Diagnosis not present

## 2023-02-28 DIAGNOSIS — Z9889 Other specified postprocedural states: Secondary | ICD-10-CM | POA: Diagnosis not present

## 2023-02-28 DIAGNOSIS — N6320 Unspecified lump in the left breast, unspecified quadrant: Secondary | ICD-10-CM | POA: Insufficient documentation

## 2023-02-28 DIAGNOSIS — M85852 Other specified disorders of bone density and structure, left thigh: Secondary | ICD-10-CM | POA: Diagnosis not present

## 2023-02-28 DIAGNOSIS — Q408 Other specified congenital malformations of upper alimentary tract: Secondary | ICD-10-CM | POA: Diagnosis not present

## 2023-02-28 DIAGNOSIS — C49A0 Gastrointestinal stromal tumor, unspecified site: Secondary | ICD-10-CM | POA: Diagnosis not present

## 2023-02-28 MED ORDER — IOHEXOL 300 MG/ML  SOLN
100.0000 mL | Freq: Once | INTRAMUSCULAR | Status: AC | PRN
  Administered 2023-02-28: 100 mL via INTRAVENOUS

## 2023-02-28 NOTE — Progress Notes (Unsigned)
Referring Provider: Wilmon Pali, FNP Primary Care Physician:  Wilmon Pali, FNP Primary GI Physician: Dr. Jena Gauss  No chief complaint on file.   HPI:   Cassandra Fowler is a 78 y.o. 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 BPE January 2022 with slight dilation of distal esophagus just above the GE junction potentially related to prior myotomy and achalasia, no mass or stricture, diffuse dysmotility. EGD 04/02/20: Erosive reflux esophagitis, somewhat dilated distal esophagus s/p 60 French Maloney dilation, stigmata of prior gastric surgery, no evidence of recurrent/persisting GIST, normal examined duodenum.  Ultimately established with [facility] for achalasia. ***  EGD with [facility] 05/03/2021 with dilation of the entire esophagus s/p Botox injection antireflux surgical site intact with healthy-appearing mucosa. Otherwise, normal exam.    Today:       Also with history of gallbladder adenomyomatosis, dilated CBD. MRI/MRCP March 2022 without obvious filling defect. Prior EUS in April 2021 in [place] after CT also noted dilated CBD revealing no significant liver, biliary, or pancreatic abnormalities. Recommended monitoring LFTs. Most recent labs 01/20/23 with normal LFTs.   Also with history of steatohepatitis/static fibrosis noted on GIST tumor biopsies in 2021 as these biopsies also contained cores of hepatic parenchyma. Abdominal ultrasound with elastography 02/04/2020 with diffusely increased parenchymal echogenicity.  kPa 1.6.   LFTs and platelets remain within normal limits on labs 01/20/23. CT CAP with contrast 02/28/23 with final read pending*** Clinically ***     History of colon polyps with colonoscopy up-to-date in March 2022,  due for repeat in 2027 if health permits.   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 The report date is 05/09/2020     The Multi-Cancer + RNA Panel offered by [facility] includes sequencing and/or deletion/duplication analysis of the following 84 genes:  AIP*, ALK, APC*, ATM*, AXIN2*,    GERD (gastroesophageal reflux disease)    GIST (gastrointestinal stroma tumor), malignant, colon    s/p resection of 2 tumors in [place] [facility] [place] resection of 2 tumors in June 2021 at [facility] in [place]   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   Pre-diabetes    Pulmonary nodule, left 11/25/2020   6 mm LUL on CT 05/20/2020   Sclerosing adenosis of right breast    Tobacco use 08/05/2021    Past Surgical History:  Procedure Laterality Date   BREAST BIOPSY Right 09/06/2019   [facility]; evaluation of suspicious calcification of right breast; pathology with atypical ductal hyperplasia,  focal cystic dilation of ducts with columnar cell change of epithelium, presence of microcalcifications.   BREAST BIOPSY Right 11/06/2020   Procedure: BREAST BIOPSY;  Surgeon: Franky Macho, MD;  Location: [facility];  Service: General;  Laterality: Right;   BREAST  LUMPECTOMY WITH RADIOFREQUENCY TAG IDENTIFICATION Right 11/06/2020   Procedure: BREAST LUMPECTOMY WITH RADIOFREQUENCY TAG IDENTIFICATION;  Surgeon: Franky Macho, MD;  Location: [facility];  Service: General;  Laterality: Right;   CATARACT EXTRACTION Right 03/28/2017   [facility]   CATARACT EXTRACTION Left 03/14/2017   [facility] [place]   COLONOSCOPY  01/28/2013   Dr. Stacey Drain, [place], [place]; Hyperplastic polyp x2, sessile serrated adenoma x2, tubular adenoma with low-grade dysplasia x1.  Recommend repeat colonoscopy in 3 years.   COLONOSCOPY WITH PROPOFOL N/A 04/02/2020   Surgeon: Corbin Ade, MD; Diverticulosis in the sigmoid and descending colon, cecal AVM, three 5-8 millimeters polyps in the rectum, mid rectum, and ascending colon resected and retrieved.  Pathology with 2 tubular adenomas, 1 hyperplastic polyp.  Recommended repeat colonoscopy in 5 years if health permits.   ESOPHAGOGASTRODUODENOSCOPY  04/24/2019   [place]; 2.4 cm gastric submucosal mass in the fundus consistent with GIST.  FNA consistent with GIST.   ESOPHAGOGASTRODUODENOSCOPY  01/28/2013   Dr. Ihor Austin Devers in [place], [place]; normal esophagus, hernia at GE junction, normal examined stomach, normal examined duodenum.  Does not appear dilation was performed.   ESOPHAGOGASTRODUODENOSCOPY (EGD) WITH PROPOFOL N/A 04/02/2020   Surgeon: Corbin Ade, MD;  Erosive reflux esophagitis, somewhat dilated distal esophagus s/p 60 French Maloney dilation, stigmata of prior gastric surgery, no evidence of recurrent/persisting GIST, normal examined duodenum.    GIST tumor resection  06/19/2019   [facility], robotic assisted laparoscopic partial gastrectomy x2.  Pathology consistent with GIST   HELLER MYOTOMY  06/19/2019   [facility]; esophageal myotomy with hiatal hernia repair and dor fundoplication   left hand surgery     MALONEY DILATION N/A 04/02/2020    Procedure: Elease Hashimoto DILATION;  Surgeon: Corbin Ade, MD;  Location: [facility];  Service: Endoscopy;  Laterality: N/A;   POLYPECTOMY  04/02/2020   Procedure: POLYPECTOMY;  Surgeon: Corbin Ade, MD;  Location: [facility];  Service: Endoscopy;;   tubal ligation     UMBILICAL HERNIA REPAIR     1970s   UPPER ESOPHAGEAL ENDOSCOPIC ULTRASOUND (EUS)  04/24/2019   [facility]; endoscopic findings: Tortuous esophagus s/p balloon dilation to 18 mm with no effect s/p biopsy, submucosal mass in the fundus, normal duodenum; endosonographic findings: 2.4 cm x 2.4 cm submucosal mass in the fundus s/p FNA, cholelithiasis, no hepatic, CBD, or pancreatic abnormalities.  Pathology: GIST, hepatic parenchyma with mild steato-fibrosis, reflux esophagitis   YAG laser posterior capsulotomy Left 08/30/2018   [facility], left eye.    Current Outpatient Medications  Medication Sig Dispense Refill   acetaminophen (TYLENOL) 500 MG tablet Take 500 mg by mouth every 6 (six) hours as needed for moderate pain.     albuterol (VENTOLIN HFA) 108 (90 Base) MCG/ACT inhaler Inhale 2 puffs into the lungs every 4 (four) hours as needed for wheezing or shortness of breath.     ascorbic acid (VITAMIN C) 1000 MG tablet Take 1 tablet by mouth daily.     Aspirin-Caffeine (BAYER BACK & BODY PO) Take by mouth daily as needed.     baclofen (LIORESAL) 10 MG tablet      benzonatate (TESSALON) 100 MG capsule  Take 1 capsule (100 mg total) by mouth every 8 (eight) hours. 30 capsule 0   Calcium Carbonate-Vitamin D3 600-400 MG-UNIT TABS Take 1 tablet by mouth daily.     Cholecalciferol 50 MCG (2000 UT) TABS Take by mouth daily.     cyanocobalamin (VITAMIN B12) 1000 MCG tablet Take 1,000 mcg by mouth daily.     losartan (COZAAR) 50 MG tablet Take 50 mg by mouth daily.     ondansetron (ZOFRAN-ODT) 4 MG disintegrating tablet Take 1 tablet (4 mg total) by mouth every 8 (eight) hours as needed for nausea  or vomiting. 20 tablet 0   pantoprazole (PROTONIX) 40 MG tablet Take 1 tablet (40 mg total) by mouth daily. 90 tablet 3   predniSONE (DELTASONE) 50 MG tablet Take 1 tablet (50 mg total) by mouth daily. 5 tablet 0   Probiotic Product (PHILLIPS COLON HEALTH) CAPS Take 1 capsule by mouth daily.     rosuvastatin (CRESTOR) 10 MG tablet Take 10 mg by mouth daily.     tamoxifen (NOLVADEX) 20 MG tablet Take 1 tablet by mouth daily. 90 tablet 1   tizanidine (ZANAFLEX) 2 MG capsule Take 2 mg by mouth as needed for muscle spasms.     No current facility-administered medications for this visit.    Allergies as of 03/01/2023 - Review Complete 02/28/2023  Allergen Reaction Noted   Penicillins Hives, Swelling, and Other (See Comments) 01/17/2020   Somatropin Anaphylaxis 09/02/2020   Tilactase Anaphylaxis 09/02/2020   Tape Rash 03/05/2020   Indomethacin Hives 01/17/2020   Tolectin [tolmetin] Hives 01/17/2020   Sulfa antibiotics Hives and Rash 09/30/2020   Wound dressing adhesive Rash 01/17/2020    Family History  Problem Relation Age of Onset   Stroke Mother    Hypertension Mother    Hearing loss Mother    Cancer Mother        d. 81 "where the stomach meets the intestine" "apple-core"   Heart disease Mother    Varicose Veins Mother    Dementia Mother    Stroke Father    Heart disease Father    Hearing loss Father    Lung cancer Brother 76       smoker   Hearing loss Brother    Depression Maternal Grandmother    Diabetes Maternal Grandfather    Colon cancer Paternal Grandmother        dx in her 86s   Hypertension Daughter    Heart disease Daughter    Hearing loss Daughter    Diabetes Daughter    Depression Daughter    Vaginal cancer Daughter        dx <53   Uterine cancer Daughter        dx <53   Breast cancer Daughter        dx <53   Drug abuse Daughter    Kidney disease Daughter    Miscarriages / [place] Daughter    Breast cancer Maternal Aunt        unknown age of  diagnosis   Breast cancer Maternal Aunt        unknown age of diagnosis   Breast cancer Cousin        unknown age of diagnosis (maternal first cousin)   Breast cancer Cousin        unknown age of diagnosis (maternal first cousin)   Miscarriages / [place] Granddaughter    Hearing loss Granddaughter    Drug abuse Granddaughter    Depression Granddaughter  Social History   Socioeconomic History   Marital status: Widowed    Spouse name: Not on file   Number of children: 1   Years of education: 12   Highest education level: High school graduate  Occupational History   Occupation: retired  Tobacco Use   Smoking status: Every Day    Types: E-cigarettes   Smokeless tobacco: Never   Tobacco comments:    Daily vaping; 1 cartridge lasts about a month  Vaping Use   Vaping status: Never Used  Substance and Sexual Activity   Alcohol use: Not Currently    Comment: glass of wine occ   Drug use: Never   Sexual activity: Not Currently    Birth control/protection: Post-menopausal, Abstinence  Other Topics Concern   Not on file  Social History Narrative   Left handed   One child   Lives with daughter   Drinks caffeine prn   Mobile home   Social Drivers of Health   Financial Resource Strain: Not on file  Food Insecurity: Low Risk  (12/13/2022)   Received from [facility]   Hunger Vital Sign    Worried About Running Out of Food in the Last Year: Never true    Ran Out of Food in the Last Year: Never true  Transportation Needs: No Transportation Needs (12/13/2022)   Received from [facility]    In the past 12 months, has lack of reliable transportation kept you from medical appointments, meetings, work or from getting things needed for daily living? : No  Physical Activity: Inactive (03/05/2020)   Exercise Vital Sign    Days of Exercise per Week: 0 days    Minutes of Exercise per Session: 0 min  Stress: Not on file  Social Connections: Not on file     Review of Systems: Gen: Denies fever, chills, anorexia. Denies fatigue, weakness, weight loss.  CV: Denies chest pain, palpitations, syncope, peripheral edema, and claudication. Resp: Denies dyspnea at rest, cough, wheezing, coughing up blood, and pleurisy. GI: Denies vomiting blood, jaundice, and fecal incontinence.   Denies dysphagia or odynophagia. Derm: Denies rash, itching, dry skin Psych: Denies depression, anxiety, memory loss, confusion. No homicidal or suicidal ideation.  Heme: Denies bruising, bleeding, and enlarged lymph nodes.  Physical Exam: There were no vitals taken for this visit. General:   Alert and oriented. No distress noted. Pleasant and cooperative.  Head:  Normocephalic and atraumatic. Eyes:  Conjuctiva clear without scleral icterus. Heart:  S1, S2 present without murmurs appreciated. Lungs:  Clear to auscultation bilaterally. No wheezes, rales, or rhonchi. No distress.  Abdomen:  +BS, soft, non-tender and non-distended. No rebound or guarding. No HSM or masses noted. Msk:  Symmetrical without gross deformities. Normal posture. Extremities:  Without edema. Neurologic:  Alert and  oriented x4 Psych:  Normal mood and affect.    Assessment:     Plan:  ***   Ermalinda Memos, PA-C [facility] 03/01/2023

## 2023-03-01 ENCOUNTER — Encounter: Payer: Self-pay | Admitting: Gastroenterology

## 2023-03-01 ENCOUNTER — Ambulatory Visit (INDEPENDENT_AMBULATORY_CARE_PROVIDER_SITE_OTHER): Payer: 59 | Admitting: Gastroenterology

## 2023-03-01 VITALS — BP 121/78 | HR 73 | Temp 97.6°F | Ht 58.5 in | Wt 164.2 lb

## 2023-03-01 DIAGNOSIS — K220 Achalasia of cardia: Secondary | ICD-10-CM

## 2023-03-01 DIAGNOSIS — R1310 Dysphagia, unspecified: Secondary | ICD-10-CM | POA: Diagnosis not present

## 2023-03-01 DIAGNOSIS — K760 Fatty (change of) liver, not elsewhere classified: Secondary | ICD-10-CM

## 2023-03-01 DIAGNOSIS — K219 Gastro-esophageal reflux disease without esophagitis: Secondary | ICD-10-CM

## 2023-03-01 DIAGNOSIS — Z8719 Personal history of other diseases of the digestive system: Secondary | ICD-10-CM | POA: Diagnosis not present

## 2023-03-01 DIAGNOSIS — K2100 Gastro-esophageal reflux disease with esophagitis, without bleeding: Secondary | ICD-10-CM

## 2023-03-01 NOTE — Patient Instructions (Addendum)
Will refer you back to [facility] for management of achalasia.  I will follow-up on your CT scan that you had completed yesterday and let you know if any additional testing is needed to see if you qualify for Rezdiffra for fatty liver.   Ermalinda Memos, PA-C [facility]

## 2023-03-03 ENCOUNTER — Other Ambulatory Visit: Payer: Self-pay | Admitting: *Deleted

## 2023-03-03 DIAGNOSIS — K220 Achalasia of cardia: Secondary | ICD-10-CM

## 2023-03-07 ENCOUNTER — Inpatient Hospital Stay: Payer: 59 | Attending: Hematology | Admitting: Hematology

## 2023-03-07 VITALS — BP 116/92 | HR 70 | Temp 97.0°F | Resp 18 | Wt 161.2 lb

## 2023-03-07 DIAGNOSIS — Z8509 Personal history of malignant neoplasm of other digestive organs: Secondary | ICD-10-CM | POA: Insufficient documentation

## 2023-03-07 DIAGNOSIS — Z79810 Long term (current) use of selective estrogen receptor modulators (SERMs): Secondary | ICD-10-CM | POA: Diagnosis not present

## 2023-03-07 DIAGNOSIS — M8580 Other specified disorders of bone density and structure, unspecified site: Secondary | ICD-10-CM | POA: Diagnosis not present

## 2023-03-07 DIAGNOSIS — C49A4 Gastrointestinal stromal tumor of large intestine: Secondary | ICD-10-CM

## 2023-03-07 DIAGNOSIS — Z780 Asymptomatic menopausal state: Secondary | ICD-10-CM

## 2023-03-07 DIAGNOSIS — D0511 Intraductal carcinoma in situ of right breast: Secondary | ICD-10-CM

## 2023-03-07 NOTE — Progress Notes (Signed)
 [facility] [address], [place] [address]    Clinic Day:  03/07/2023  Referring physician: Wilmon Pali, FNP  Patient Care Team: Wilmon Pali, FNP as PCP - General (Family Medicine) Marjo Bicker, MD as PCP - Cardiology (Cardiology) Jena Gauss Gerrit Friends, MD as Consulting Physician (Gastroenterology) Doreatha Massed, MD as Medical Oncologist (Medical Oncology) Therese Sarah, RN as Oncology Nurse Navigator (Medical Oncology) Elwyn Reach (Neurology)   ASSESSMENT & PLAN:   Assessment: 1. Right breast DCIS: - Had initial biopsy on 09/06/2018 one of the right breast in [place] consistent with ADH. - On 11/06/2020, lumpectomy. - Pathology consistent with 1.5 cm DCIS, margins negative, intermediate grade, pTis P NX.  She did not receive radiation therapy.  Tamoxifen started on 04/12/2021.   2. Social/family history: - She is retired Lawyer.  Quit smoking 3 years ago, smoked 1 pack/day. - Mother had colon cancer, brother had lung cancer.  Maternal cousin had ovarian/cervical cancer.  Daughter had ovarian/cervical/vaginal cancer.  Maternal aunt had breast cancer.  3.  Multifocal gastric GIST tumors: -CT CAP on 05/30/2019 at [facility] in [place] shows 4 x 2 x 2 cm mass in fundus with no metastatic disease.  6 mm solitary pulmonary nodule in the left upper lobe. -On 06/19/2019-partial gastrectomy x2, hiatal hernia repair, esophageal myotomy - Pathology shows 2 gastrointestinal stromal tumors, smaller lesion measuring 0.7 cm and larger lesion measuring 4.7 cm, mitotic rate less than 5/39mm2 for both lesions, low-grade, very low risk, no lymph nodes found (PT 1 and PT 2 N0). -She was evaluated by Dr. Lesia Sago of medical oncology at [facility] and was started on adjuvant imatinib 400 mg daily. - She appears to be in the very low risk range with a 1.9% metastatic rate.  This prognostic assessment applies best to KIT negative or  PDGFR positive GIST's, whereas SDH deficient GIST's are more unpredictable.  Unsure why she was started on imatinib.  Was possible that she was part of clinical trial.    Plan: Right breast DCIS ER/PR positive: - She is tolerating tamoxifen very well.  No significant side effects reported. - Reviewed mammogram from 02/28/2023: BI-RADS Category 2.  She will repeat screening mammogram in 1 year. - Reviewed labs today: Normal LFTs.  CBC grossly normal. - RTC 6 months for follow-up with repeat labs and exam.  2.  Multifocal gastric GIST tumors: - Based on very low risk disease and low percent metastatic great, imatinib discontinued in October 2022. - Reviewed CT CAP from 02/28/2023: Stable postsurgical changes with no evidence of recurrence.  Fatty liver infiltration.  Other benign findings were discussed with the patient. - Will repeat imaging in 1 year.  3.  Osteopenia (DEXA scan on 11/27/2020 T score -1.9): - Reviewed DEXA scan from 02/28/2023: T-score -2.2, not significantly changed from the last DEXA scan. - Vitamin D level is 40.4.  Continue calcium and vitamin D supplements.    No orders of the defined types were placed in this encounter.     Alben Deeds Teague,acting as a Neurosurgeon for Doreatha Massed, MD.,have documented all relevant documentation on the behalf of Doreatha Massed, MD,as directed by  Doreatha Massed, MD while in the presence of Doreatha Massed, MD.  I, Doreatha Massed MD, have reviewed the above documentation for accuracy and completeness, and I agree with the above.    Doreatha Massed, MD   2/25/20254:38 PM  CHIEF COMPLAINT:   Diagnosis: right breast DCIS  and multifocal GIST   Cancer Staging  Breast neoplasm, Tis (DCIS), right Staging form: Breast, AJCC 8th Edition - Clinical stage from 11/13/2020: Stage 0 (cTis (DCIS), cN0, cM0, ER+, PR+, HER2: Not Assessed) - Unsigned    Prior Therapy: 1. Partial gastrectomy x2 on 06/19/19 2. Adjuvant  imatinib through 10/2020 3. Lumpectomy on 11/06/20  Current Therapy:  tamoxifen   HISTORY OF PRESENT ILLNESS:   Oncology History  GIST (gastrointestinal stroma tumor), malignant, colon  01/17/2020 Initial Diagnosis   GIST (gastrointestinal stroma tumor), malignant, colon (HCC)   05/09/2020 Genetic Testing   Negative genetic testing:  No pathogenic variants detected on the [facility] Multi-Cancer + RNA panel. 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 The report date is 05/09/2020  The Multi-Cancer + RNA Panel offered by [facility] includes sequencing and/or deletion/duplication analysis of the following 84 genes:  AIP*, ALK, APC*, ATM*, AXIN2*, BAP1*, BARD1*, BLM*, BMPR1A*, BRCA1*, BRCA2*, BRIP1*, CASR, CDC73*, CDH1*, CDK4, CDKN1B*, CDKN1C*, CDKN2A, CEBPA, CHEK2*, CTNNA1*, DICER1*, DIS3L2*, EGFR, EPCAM, FH*, FLCN*, GATA2*, GPC3, GREM1, HOXB13, HRAS, KIT, MAX*, MEN1*, MET, MITF, MLH1*, MSH2*, MSH3*, MSH6*, MUTYH*, NBN*, NF1*, NF2*, NTHL1*, PALB2*, PDGFRA, PHOX2B, PMS2*, POLD1*, POLE*, POT1*, PRKAR1A*, PTCH1*, PTEN*, RAD50*, RAD51C*, RAD51D*, RB1*, RECQL4, RET, RUNX1*, SDHA*, SDHAF2*, SDHB*, SDHC*, SDHD*, SMAD4*, SMARCA4*, SMARCB1*, SMARCE1*, STK11*, SUFU*, TERC, TERT, TMEM127*, Tp53*, TSC1*, TSC2*, VHL*, WRN*, and WT1.  RNA analysis is performed for * genes.        INTERVAL HISTORY:   Lonni is a 78 y.o. female presenting to clinic today for follow up of right breast DCIS and multifocal GIST. She was last seen by me on 05/26/22.  Since her last visit, she underwent CT C/A/P on 02/28/23 that found: Stable surgical changes along the upper stomach. No developing mass lesion, fluid collection or lymph node enlargement. Left-sided colonic diverticula. Fatty liver infiltration. Patulous esophagus with slight wall thickening distally, unchanged. Previous ground-glass right upper lobe lung nodule no longer seen. Stable small left-sided lung nodule.  Stable cystic lesion in the neck anterior to the pharynx the level of the hyoid bone.  Today, she states that she is doing well overall. Her appetite level is at 60%. Her energy level is at 50%. Creedence is tolerating Tamoxifen well and taking it as prescribed. She denies any vaginal spotting or bleeding. She will schedule a follow-up with a specialist for her pharynx cystic lesion. Raksha states she has dysphagia.   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 The report date is 05/09/2020     The Multi-Cancer + RNA Panel offered by [facility] includes sequencing and/or deletion/duplication analysis of the following 84 genes:  AIP*, ALK,  APC*, ATM*, AXIN2*,    GERD (gastroesophageal reflux disease)    GIST (gastrointestinal stroma tumor), malignant, colon    s/p resection of 2 tumors in June 2021 at [facility] [place] resection of 2 tumors in June 2021 at [facility] in [place]   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   Pre-diabetes    Pulmonary nodule, left 11/25/2020   6 mm LUL on CT 05/20/2020   Sclerosing adenosis of right breast    Tobacco use 08/05/2021    Surgical History: Past Surgical History:   Procedure Laterality Date   BREAST BIOPSY Right 09/06/2019   [facility]; evaluation of suspicious calcification of right breast; pathology with atypical ductal hyperplasia, focal cystic dilation of ducts with columnar cell change of epithelium, presence of microcalcifications.   BREAST BIOPSY Right 11/06/2020   Procedure: BREAST BIOPSY;  Surgeon: Franky Macho, MD;  Location: [facility];  Service: General;  Laterality: Right;   BREAST LUMPECTOMY WITH RADIOFREQUENCY TAG IDENTIFICATION Right 11/06/2020   Procedure: BREAST LUMPECTOMY WITH RADIOFREQUENCY TAG IDENTIFICATION;  Surgeon: Franky Macho, MD;  Location: [facility];  Service: General;  Laterality: Right;   CATARACT EXTRACTION Right 03/28/2017   [facility]   CATARACT EXTRACTION Left 03/14/2017   [facility]   COLONOSCOPY  01/28/2013   Dr. Stacey Drain, [place], [place]; Hyperplastic polyp x2, sessile serrated adenoma x2, tubular adenoma with low-grade dysplasia x1.  Recommend repeat colonoscopy in 3 years.   COLONOSCOPY WITH PROPOFOL N/A 04/02/2020   Surgeon: Corbin Ade, MD; Diverticulosis in the sigmoid and descending colon, cecal AVM, three 5-8 millimeters polyps in the rectum, mid rectum, and ascending colon resected and retrieved.  Pathology with 2 tubular adenomas, 1 hyperplastic polyp.  Recommended repeat colonoscopy in 5 years if health permits.   ESOPHAGOGASTRODUODENOSCOPY  04/24/2019   [place]; 2.4 cm gastric submucosal mass in the fundus consistent with GIST.  FNA consistent with GIST.   ESOPHAGOGASTRODUODENOSCOPY  01/28/2013   Dr. Ihor Austin Devers in [place], [place]; normal esophagus, hernia at GE junction, normal examined stomach, normal examined duodenum.  Does not appear dilation was performed.   ESOPHAGOGASTRODUODENOSCOPY (EGD) WITH PROPOFOL N/A 04/02/2020   Surgeon: Corbin Ade, MD;  Erosive reflux esophagitis, somewhat dilated distal esophagus s/p 60 French Maloney dilation,  stigmata of prior gastric surgery, no evidence of recurrent/persisting GIST, normal examined duodenum.    GIST tumor resection  06/19/2019   [facility], robotic assisted laparoscopic partial gastrectomy x2.  Pathology consistent with GIST   HELLER MYOTOMY  06/19/2019   [facility]; esophageal myotomy with hiatal hernia repair and dor fundoplication   left hand surgery     MALONEY DILATION N/A 04/02/2020   Procedure: Elease Hashimoto DILATION;  Surgeon: Corbin Ade, MD;  Location: [facility];  Service: Endoscopy;  Laterality: N/A;   POLYPECTOMY  04/02/2020   Procedure: POLYPECTOMY;  Surgeon: Corbin Ade, MD;  Location: [facility];  Service: Endoscopy;;   tubal ligation     UMBILICAL HERNIA REPAIR     1970s   UPPER ESOPHAGEAL ENDOSCOPIC ULTRASOUND (EUS)  04/24/2019   [facility]; endoscopic findings: Tortuous esophagus s/p balloon dilation to 18 mm with no effect s/p biopsy, submucosal mass in the fundus, normal duodenum; endosonographic findings: 2.4 cm x 2.4 cm submucosal mass in the fundus s/p FNA, cholelithiasis, no hepatic, CBD, or pancreatic abnormalities.  Pathology: GIST, hepatic parenchyma with mild steato-fibrosis, reflux esophagitis   YAG laser posterior capsulotomy Left 08/30/2018   [facility], left eye.    Social History: Social History   Socioeconomic History   Marital status: Widowed    Spouse name: Not on file   Number of children: 1   Years of education: 12   Highest education level: High school graduate  Occupational History   Occupation: retired  Tobacco Use   Smoking status: Every Day    Types: E-cigarettes   Smokeless tobacco: Never   Tobacco comments:    Daily vaping; 1 cartridge lasts about a month  Vaping Use   Vaping status: Never Used  Substance and Sexual Activity   Alcohol use: Not Currently    Comment: glass of wine occ   Drug use: Never   Sexual activity: Not Currently     Birth control/protection: Post-menopausal, Abstinence  Other Topics Concern   Not on file  Social History Narrative   Left handed   One child   Lives with daughter   Drinks caffeine prn   Mobile home   Social Drivers of Health   Financial Resource Strain: Not on file  Food Insecurity: Low Risk  (12/13/2022)   Received from [facility]   Hunger Vital Sign    Worried About Running Out of Food in the Last Year: Never true    Ran Out of Food in the Last Year: Never true  Transportation Needs: No Transportation Needs (12/13/2022)   Received from [employer]    In the past 12 months, has lack of reliable transportation kept you from medical appointments, meetings, work or from getting things needed for daily living? : No  Physical Activity: Inactive (03/05/2020)   Exercise Vital Sign    Days of Exercise per Week: 0 days    Minutes of Exercise per Session: 0 min  Stress: Not on file  Social Connections: Not on file  Intimate Partner Violence: Not At Risk (03/05/2020)   Humiliation, Afraid, Rape, and Kick questionnaire    Fear of Current or Ex-Partner: No    Emotionally Abused: No    Physically Abused: No    Sexually Abused: No    Family History: Family History  Problem Relation Age of Onset   Stroke Mother    Hypertension Mother    Hearing loss Mother    Cancer Mother        d. 32 "where the stomach meets the intestine" "apple-core"   Heart disease Mother    Varicose Veins Mother    Dementia Mother    Stroke Father    Heart disease Father    Hearing loss Father    Lung cancer Brother 26       smoker   Hearing loss Brother    Depression Maternal Grandmother    Diabetes Maternal Grandfather    Colon cancer Paternal Grandmother        dx in her 30s   Hypertension Daughter    Heart disease Daughter    Hearing loss Daughter    Diabetes Daughter    Depression Daughter    Vaginal cancer Daughter        dx <53   Uterine cancer Daughter        dx <53    Breast cancer Daughter        dx <53   Drug abuse Daughter    Kidney disease Daughter    Miscarriages / [place] Daughter    Breast cancer Maternal Aunt        unknown age of diagnosis  Breast cancer Maternal Aunt        unknown age of diagnosis   Breast cancer Cousin        unknown age of diagnosis (maternal first cousin)   Breast cancer Cousin        unknown age of diagnosis (maternal first cousin)   Miscarriages / [place] Granddaughter    Hearing loss Granddaughter    Drug abuse Granddaughter    Depression Granddaughter     Current Medications:  Current Outpatient Medications:    acetaminophen (TYLENOL) 500 MG tablet, Take 500 mg by mouth every 6 (six) hours as needed for moderate pain., Disp: , Rfl:    albuterol (VENTOLIN HFA) 108 (90 Base) MCG/ACT inhaler, Inhale 2 puffs into the lungs every 4 (four) hours as needed for wheezing or shortness of breath., Disp: , Rfl:    ascorbic acid (VITAMIN C) 1000 MG tablet, Take 1 tablet by mouth daily., Disp: , Rfl:    Aspirin-Caffeine (BAYER BACK & BODY PO), Take by mouth daily as needed., Disp: , Rfl:    Calcium Carbonate-Vitamin D3 600-400 MG-UNIT TABS, Take 1 tablet by mouth daily., Disp: , Rfl:    Cholecalciferol 50 MCG (2000 UT) TABS, Take by mouth daily., Disp: , Rfl:    cyanocobalamin (VITAMIN B12) 1000 MCG tablet, Take 1,000 mcg by mouth daily., Disp: , Rfl:    losartan (COZAAR) 50 MG tablet, Take 50 mg by mouth daily., Disp: , Rfl:    ondansetron (ZOFRAN-ODT) 4 MG disintegrating tablet, Take 1 tablet (4 mg total) by mouth every 8 (eight) hours as needed for nausea or vomiting., Disp: 20 tablet, Rfl: 0   pantoprazole (PROTONIX) 40 MG tablet, Take 1 tablet (40 mg total) by mouth daily., Disp: 90 tablet, Rfl: 3   Probiotic Product (PHILLIPS COLON HEALTH) CAPS, Take 1 capsule by mouth daily., Disp: , Rfl:    rosuvastatin (CRESTOR) 10 MG tablet, Take 10 mg by mouth daily., Disp: , Rfl:    tamoxifen (NOLVADEX) 20 MG tablet,  Take 1 tablet by mouth daily., Disp: 90 tablet, Rfl: 1   tizanidine (ZANAFLEX) 2 MG capsule, Take 2 mg by mouth as needed for muscle spasms., Disp: , Rfl:    Allergies: Allergies  Allergen Reactions   Penicillins Hives, Swelling and Other (See Comments)   Somatropin Anaphylaxis   Tilactase Anaphylaxis   Tape Rash   Indomethacin Hives   Tolectin [Tolmetin] Hives    Pt can not recall   Sulfa Antibiotics Hives and Rash   Wound Dressing Adhesive Rash    Adhesive tape     REVIEW OF SYSTEMS:   Review of Systems  Constitutional:  Negative for chills, fatigue and fever.  HENT:   Positive for trouble swallowing. Negative for lump/mass, mouth sores, nosebleeds and sore throat.   Eyes:  Negative for eye problems.  Respiratory:  Positive for shortness of breath (with exertion). Negative for cough.   Cardiovascular:  Negative for chest pain, leg swelling and palpitations.  Gastrointestinal:  Negative for abdominal pain, constipation, diarrhea, nausea and vomiting.  Genitourinary:  Negative for bladder incontinence, difficulty urinating, dysuria, frequency, hematuria, nocturia and vaginal bleeding.   Musculoskeletal:  Negative for arthralgias, back pain, flank pain, myalgias and neck pain.       +right hand pain, 6/10 severity  Skin:  Negative for itching and rash.  Neurological:  Positive for headaches. Negative for dizziness and numbness.  Hematological:  Does not bruise/bleed easily.  Psychiatric/Behavioral:  Positive for depression and sleep disturbance. Negative  for suicidal ideas. The patient is not nervous/anxious.   All other systems reviewed and are negative.    VITALS:   Blood pressure (!) 116/92, pulse 70, temperature (!) 97 F (36.1 C), temperature source Tympanic, resp. rate 18, weight 161 lb 3.2 oz (73.1 kg), SpO2 97%.  Wt Readings from Last 3 Encounters:  03/07/23 161 lb 3.2 oz (73.1 kg)  03/01/23 164 lb 3.2 oz (74.5 kg)  02/24/23 161 lb (73 kg)    Body mass index is  33.12 kg/m.  Performance status (ECOG): 1 - Symptomatic but completely ambulatory  PHYSICAL EXAM:   Physical Exam Vitals and nursing note reviewed. Exam conducted with a chaperone present.  Constitutional:      Appearance: Normal appearance.  Cardiovascular:     Rate and Rhythm: Normal rate and regular rhythm.     Pulses: Normal pulses.     Heart sounds: Normal heart sounds.  Pulmonary:     Effort: Pulmonary effort is normal.     Breath sounds: Normal breath sounds.  Abdominal:     Palpations: Abdomen is soft. There is no hepatomegaly, splenomegaly or mass.     Tenderness: There is no abdominal tenderness.  Musculoskeletal:     Right lower leg: No edema.     Left lower leg: No edema.  Lymphadenopathy:     Cervical: No cervical adenopathy.     Right cervical: No superficial, deep or posterior cervical adenopathy.    Left cervical: No superficial, deep or posterior cervical adenopathy.     Upper Body:     Right upper body: No supraclavicular or axillary adenopathy.     Left upper body: No supraclavicular or axillary adenopathy.  Neurological:     General: No focal deficit present.     Mental Status: She is alert and oriented to person, place, and time.  Psychiatric:        Mood and Affect: Mood normal.        Behavior: Behavior normal.     LABS:      Latest Ref Rng & Units 01/20/2023    8:08 AM 05/19/2022    1:07 PM 10/28/2021    1:14 PM  CBC  WBC 4.0 - 10.5 K/uL 11.3  10.2  8.8   Hemoglobin 12.0 - 15.0 g/dL 69.6  29.5  28.4   Hematocrit 36.0 - 46.0 % 41.5  39.0  38.2   Platelets 150 - 400 K/uL 243  219  239       Latest Ref Rng & Units 01/20/2023    8:08 AM 05/19/2022    1:07 PM 10/28/2021    1:14 PM  CMP  Glucose 70 - 99 mg/dL 132  440  102   BUN 8 - 23 mg/dL 11  15  10    Creatinine 0.44 - 1.00 mg/dL 7.25  3.66  4.40   Sodium 135 - 145 mmol/L 138  135  141   Potassium 3.5 - 5.1 mmol/L 3.7  3.6  3.8   Chloride 98 - 111 mmol/L 102  104  106   CO2 22 - 32  mmol/L 27  24  28    Calcium 8.9 - 10.3 mg/dL 9.5  8.8  9.1   Total Protein 6.5 - 8.1 g/dL 6.7  6.6  6.6   Total Bilirubin 0.0 - 1.2 mg/dL 0.5  1.2  1.1   Alkaline Phos 38 - 126 U/L 45  47  47   AST 15 - 41 U/L 14  16  15  ALT 0 - 44 U/L 14  16  17       No results found for: "CEA1", "CEA" / No results found for: "CEA1", "CEA" No results found for: "PSA1" No results found for: "WUJ811" No results found for: "CAN125"  No results found for: "TOTALPROTELP", "ALBUMINELP", "A1GS", "A2GS", "BETS", "BETA2SER", "GAMS", "MSPIKE", "SPEI" No results found for: "TIBC", "FERRITIN", "IRONPCTSAT" Lab Results  Component Value Date   LDH 142 10/28/2021   LDH 144 04/05/2021   LDH 128 11/13/2020     STUDIES:   CT CHEST ABDOMEN PELVIS W CONTRAST Result Date: 03/06/2023 CLINICAL DATA:  Follow-up gastrointestinal stromal tumor. * Tracking Code: BO * EXAM: CT CHEST, ABDOMEN, AND PELVIS WITH CONTRAST TECHNIQUE: Multidetector CT imaging of the chest, abdomen and pelvis was performed following the standard protocol during bolus administration of intravenous contrast. RADIATION DOSE REDUCTION: This exam was performed according to the departmental dose-optimization program which includes automated exposure control, adjustment of the mA and/or kV according to patient size and/or use of iterative reconstruction technique. CONTRAST:  OMNIPAQUE IOHEXOL 300 MG/ML  SOLN COMPARISON:  CT 04/05/2021 FINDINGS: CT CHEST FINDINGS Cardiovascular: Heart is nonenlarged. No pericardial effusion. Coronary artery calcifications. Thoracic aorta is normal course and caliber with scattered atherosclerotic changes. Mediastinum/Nodes: Patulous esophagus. Slight wall thickening along the lower esophagus, similar to previous. Preserved thyroid gland. No specific abnormal lymph node enlargement identified in the axillary regions, hilum or mediastinum. Stable cystic area noted in the low neck above the hyoid bone as on previous. Please  correlate for any known history including a thyroglossal duct cyst or other process. Lungs/Pleura: Mild linear opacity lung bases likely scar or atelectasis. No consolidation, pneumothorax or effusion. Rounded nodule noncalcified left upper lobe is stable measuring 5 mm on series 3, image 50. the small 7 mm ground-glass nodule on the prior in the right upper lobe is not seen on the current examination. Musculoskeletal: Curvature and degenerative changes along the spine. CT ABDOMEN PELVIS FINDINGS Hepatobiliary: Fatty liver infiltration. No space-occupying liver lesion. Gallbladder is contracted. Patent portal vein. Slight ectasia of the common duct but normal tapering towards the pancreatic head in the appearance is unchanged from previous. Pancreas: Unremarkable. No pancreatic ductal dilatation or surrounding inflammatory changes. Spleen: Normal in size without focal abnormality. Adrenals/Urinary Tract: Adrenal glands are preserved. No enhancing renal mass or collecting system dilatation. There is some small left-sided renal cysts identified. Once again dominant focus measures 5.1 cm. These are consistent with Bosniak 1 lesions and no specific imaging follow-up. The ureters have normal course and caliber extending down to the urinary bladder. Bladder is underdistended. Stomach/Bowel: Large bowel has a normal course and caliber with scattered colonic stool. Left-sided colonic diverticula. Slightly redundant course of the sigmoid colon. Stomach and small bowel are nondilated. Oral contrast was administered. Surgical changes along the upper stomach near the GE junction. No abnormal soft tissue or mass lesion in this location level of the suture line. Vascular/Lymphatic: No significant vascular findings are present. No enlarged abdominal or pelvic lymph nodes. Reproductive: Status post hysterectomy. No adnexal masses. Other: No abdominal wall hernia or abnormality. No abdominopelvic ascites. Musculoskeletal: Mild  degenerative changes of the spine and pelvis. Trace anterolisthesis of L4 on L5 greater than L2-3 and L3-4. Multilevel stenosis. IMPRESSION: Stable surgical changes along the upper stomach. No developing mass lesion, fluid collection or lymph node enlargement. Left-sided colonic diverticula.  Fatty liver infiltration. Patulous esophagus with slight wall thickening distally, unchanged. Continued follow-up surveillance and correlate with any symptoms.  Previous ground-glass right upper lobe lung nodule no longer seen. This may have been infectious or inflammatory. Stable small left-sided lung nodule. Stable cystic lesion in the neck anterior to the pharynx the level of the hyoid bone. Please correlate for any known history or dedicated workup when appropriate Electronically Signed   By: Karen Kays M.D.   On: 03/06/2023 15:12   DG Bone Density Result Date: 02/28/2023 EXAM: DUAL X-RAY ABSORPTIOMETRY (DXA) FOR BONE MINERAL DENSITY IMPRESSION: Your patient Cassandra Fowler completed a BMD test on 02/28/2023 using the [employer] DXA System (software version: 14.10) manufactured by [employer]. The following summarizes the results of our evaluation. Technologist:[facility] PATIENT BIOGRAPHICAL: Name: Avanthika, Dehnert Patient ID: [id number] Birth Date: 09-Nov-1945 Height: 58.0 in. Gender: Female Exam Date: 02/28/2023 Weight: 161.0 lbs. Indications: Caucasian, Follow up Osteopenia, Height Loss, Hx Breast Ca, Partial Hysterectomy, Post Menopausal, Rheumatoid Arthritis, Tobacco User, Tobacco User (Current Smoker) Fractures: Treatments: Vitamin D DENSITOMETRY RESULTS: Site          Region     Measured Date Measured Age WHO Classification Young Adult T-score BMD         %Change vs. Previous Significant Change (*) DualFemur Neck Left 02/28/2023 76.3 Osteopenia -2.2 0.727 g/cm2 -6.3% Yes DualFemur Neck Left 11/27/2020 74.0 Osteopenia -1.9 0.776 g/cm2 - - DualFemur Total Mean 02/28/2023 76.3 Normal -0.8 0.908 g/cm2  -1.9% - DualFemur Total Mean 11/27/2020 74.0 Normal -0.6 0.926 g/cm2 - - Right Forearm Radius 33% 02/28/2023 76.3 Osteopenia -1.3 0.621 g/cm2 -7.0% Yes Right Forearm Radius 33% 11/27/2020 74.0 Normal -0.6 0.668 g/cm2 - - ASSESSMENT: The BMD measured at Femur Neck Left is 0.727 g/cm2 with a T-score of -2.2. This patient's diagnostic category is LOW BONE MASS/OSTEOPENIA according to World Health Organization [facility]) criteria. The scan quality is good. Compared with the prior study on 11/27/20, the BMD of the total mean of the hips shows no statistically significant change. The forearm shows a significant decrease. Lumbar spine was excluded due to advanced degenerative changes. World Science writer [facility]) criteria for post-menopausal, Caucasian Women: Normal:       T-score at or above -1 SD Osteopenia:   T-score between -1 and -2.5 SD Osteoporosis: T-score at or below -2.5 SD RECOMMENDATIONS: 1. All patients should optimize calcium and vitamin D intake. 2. Consider FDA-approved medical therapies in postmenopausal women and med aged 37 years and older, based on the following: a. A hip or vertebral (clinical or morphometric) fracture b. T-score< -2.5 at the femoral neck or spine after appropriate evaluation to exclude secondary causes c. Low bone mass (T-score between -1.0 and -2.5 at the femoral neck or spine) and a 10-year probability of a hip fracture > 3% or a 10-year probability of a major osteoporosis-related fracture > 20% based on the [place]-adapted WHO algorithm d. Clinician judgment and/or patient preferences may indicate treatment for people with 10-year fracture probabilities above or below these levels FOLLOW-UP: Patients with diagnosis of osteoporosis or at high risk for fracture should have regular bone mineral density tests. For patients eligible for Medicare, routine testing is allowed once every 2 years. The testing frequency can be increased to one year for patients who have rapidly progressing disease,  those who are receiving or discontinuing medical therapy to restore bone mass, or have additional risk factors. I have reviewed this report, and agree with the above findings. [facility] Your patient Cynthis Purington completed a FRAX assessment on 02/28/2023 using the [facility] Prodigy DXA System (analysis version: 14.10) manufactured  by [employer]. The following summarizes the results of our evaluation. PATIENT BIOGRAPHICAL: Name: Taraoluwa, Thakur Patient ID: [id number] Birth Date: 1945-05-25 Height:    58.0 in. Gender:     [age]    [age]       Weight:    161.0 lbs. Ethnicity:  White                            Exam Date: 02/28/2023 FRAX* RESULTS:  (version: 3.5) 10-year Probability of Fracture1 Major Osteoporotic Fracture2 Hip Fracture 20.6% 9.4% Population: [place] (Caucasian) Risk Factors: Rheumatoid Arthritis, Tobacco User (Current Smoker) Based on Femur (Left) Neck BMD 1 -The 10-year probability of fracture may be lower than reported if the patient has received treatment. 2 -Major Osteoporotic Fracture: Clinical Spine, Forearm, Hip or Shoulder *FRAX is a Armed forces logistics/support/administrative officer of the [employer] of [employer] for Metabolic Bone Disease, a World Science writer (WHO) [employer]. ASSESSMENT: The probability of a major osteoporotic fracture is 20.6% within the next ten years. The probability of a hip fracture is 9.4% within the next ten years. Electronically Signed   By: Harmon Pier M.D.   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 EXAM: DIGITAL DIAGNOSTIC BILATERAL MAMMOGRAM WITH TOMOSYNTHESIS AND CAD TECHNIQUE: Bilateral digital diagnostic mammography and breast tomosynthesis was performed. The images were evaluated with computer-aided detection.  COMPARISON:  Previous exam(s). ACR Breast Density Category b: There are scattered areas of fibroglandular density. FINDINGS: No suspicious masses or calcifications are seen in either. Stable postsurgical changes in the upper outer right breast related to prior lumpectomy. Spot compression magnification CC view of the right breast lumpectomy site was performed. There is no mammographic evidence of locally recurrent malignancy. IMPRESSION: Stable lumpectomy changes in the right breast. No mammographic evidence of malignancy in either breast. RECOMMENDATION: 1.  Screening mammogram in one year.(Code:SM-B-01Y) 2. The patient is now two or more years post lumpectomy and therefore may return to annual screening mammography, however given history of breast cancer the patient does remain eligible for annual diagnostic mammography if indicated. I have discussed the findings and recommendations with the patient. If applicable, a reminder letter will be sent to the patient regarding the next appointment. BI-RADS CATEGORY  2: Benign. Electronically Signed   By: Edwin Cap M.D.   On: 02/28/2023 11:49

## 2023-03-07 NOTE — Patient Instructions (Addendum)
 [facility] at [facility] Discharge Instructions   You were seen and examined today by Dr. Ellin Saba.  He reviewed the results of your lab work which are normal/stable.   He reviewed the results of your CT scan which does not show any evidence of cancer.   Your mammogram did not show any cancer.   We will see you back in 6 months. We will repeat lab work at that time.   Return as scheduled.    Thank you for choosing [facility] at [facility] to provide your oncology and hematology care.  To afford each patient quality time with our provider, please arrive at least 15 minutes before your scheduled appointment time.   If you have a lab appointment with the Cancer Center please come in thru the Main Entrance and check in at the main information desk.  You need to re-schedule your appointment should you arrive 10 or more minutes late.  We strive to give you quality time with our providers, and arriving late affects you and other patients whose appointments are after yours.  Also, if you no show three or more times for appointments you may be dismissed from the clinic at the providers discretion.     Again, thank you for choosing [facility].  Our hope is that these requests will decrease the amount of time that you wait before being seen by our physicians.       _____________________________________________________________  Should you have questions after your visit to [facility], please contact our office at [phone] and follow the prompts.  Our office hours are 8:00 a.m. and 4:30 p.m. Monday - Friday.  Please note that voicemails left after 4:00 p.m. may not be returned until the following business day.  We are closed weekends and major holidays.  You do have access to a nurse 24-7, just call the main number to the clinic [phone] and do not press any options, hold on the line and a nurse will answer the phone.     For prescription refill requests, have your pharmacy contact our office and allow 72 hours.    Due to Covid, you will need to wear a mask upon entering the hospital. If you do not have a mask, a mask will be given to you at the Main Entrance upon arrival. For doctor visits, patients may have 1 support person age 34 or older with them. For treatment visits, patients can not have anyone with them due to social distancing guidelines and our immunocompromised population.

## 2023-03-09 ENCOUNTER — Telehealth: Payer: Self-pay | Admitting: Gastroenterology

## 2023-03-09 ENCOUNTER — Other Ambulatory Visit: Payer: Self-pay

## 2023-03-09 ENCOUNTER — Other Ambulatory Visit: Payer: Self-pay | Admitting: *Deleted

## 2023-03-09 ENCOUNTER — Ambulatory Visit (HOSPITAL_BASED_OUTPATIENT_CLINIC_OR_DEPARTMENT_OTHER): Payer: 59 | Attending: Cardiology | Admitting: Cardiology

## 2023-03-09 DIAGNOSIS — G4733 Obstructive sleep apnea (adult) (pediatric): Secondary | ICD-10-CM | POA: Diagnosis not present

## 2023-03-09 DIAGNOSIS — K760 Fatty (change of) liver, not elsewhere classified: Secondary | ICD-10-CM

## 2023-03-09 DIAGNOSIS — K7400 Hepatic fibrosis, unspecified: Secondary | ICD-10-CM

## 2023-03-09 NOTE — Telephone Encounter (Signed)
 Lmom for pt to return call

## 2023-03-09 NOTE — Telephone Encounter (Signed)
 Reviewed CT chest abdomen pelvis 02/28/2023.  No signs of cirrhosis.  Spleen is normal.  We can pursue liver elastography and have her complete FibroSure blood test to see what degree of liver fibrosis she has and whether or not she would qualify for Rezdiffra.   Mindy/Tammy:  Please arrange liver elastography. Dx: history of fatty liver and liver fibrosis, evaluate degree of fibrosis.   Tammy:  Please arrange NASH FibroSure.

## 2023-03-09 NOTE — Telephone Encounter (Signed)
 Pt was made aware and verbalized understanding. NASH was ordered and pt is needing her daughter to be called before setting up the time for the elastography due to possible scheduling conflicts. Daughter's number is main number on chart.

## 2023-03-09 NOTE — Telephone Encounter (Signed)
 LMOVM to return call.

## 2023-03-15 NOTE — Telephone Encounter (Signed)
 Spoke with Alvino Chapel, pt's daughter (on dpr) and she said Monday or Friday would work for [place]. Smitty Cords that pt's [place] is scheduled for Friday 03/24/23 at [facility], arrive at 8:15 am to check in, NPO after midnight.

## 2023-03-21 NOTE — Procedures (Signed)
   [facility] [address] [place], [place] [address] Tel: [phone]   Fax: [phone] Titration Interpretation  Patient Name:  Cassandra Fowler, Cassandra Fowler Date:  03/09/2023 Referring Physician:  Armanda Magic MD  Indications for Polysomnography The patient is a 78 year-old Female who is 4\' 10"  and weighs 164.0 lbs. His BMI equals 34.4.  A full night titration treatment study was performed.  Polysomnogram Data A full night polysomnogram recorded the standard physiologic parameters including EEG, EOG, EMG, EKG, nasal and oral airflow.  Respiratory parameters of chest and abdominal movements were recorded with Respiratory Inductance Plethysmography belts.  Oxygen saturation was recorded by pulse oximetry.   Sleep Architecture The total recording time of the polysomnogram was 374.2 minutes.  The total sleep time was 262.5 minutes.  The patient spent 3.6% of total sleep time in Stage N1, 78.5% in Stage N2, 0.0% in Stages N3, and 17.9% in REM.  Sleep latency was 8.2 minutes.  REM latency was 146.0 minutes.  Sleep Efficiency was 70.1%.  Wake after Sleep Onset time was 103.5 minutes.  Titration Summary The patient was titrated at pressures ranging from 5* cm/H20 with supplemental oxygen at - up to 14* cm/H20 with supplemental oxygen at -.  The last pressure used in the study was 14* cm/H20 with supplemental oxygen at -.  Respiratory Events The polysomnogram revealed a presence of 1 obstructive, 0 central, and 0 mixed apneas resulting in an Apnea index of 0.2 events per hour.  There were 44 hypopneas (>=3% desaturation and/or arousal) resulting in an Apnea\Hypopnea Index (AHI >=3% desaturation and/or arousal) of 10.3 events per hour.  There were 14 hypopneas (>=4% desaturation) resulting in an Apnea\Hypopnea Index (AHI >=4% desaturation) of 3.4 events per hour.  There were 0 Respiratory Effort Related Arousals resulting in a RERA index of 0 events per hour. The  Respiratory Disturbance Index is 10.3 events per hour.  The snore index was 0 events per hour.  Mean oxygen saturation was 91.8%.  The lowest oxygen saturation during sleep was 88.0%.  Time spent <=88% oxygen saturation was 0.8 minutes (0.2%).  Limb Activity There were 54 limb movements recorded.  Of this total, 48 were classified as PLMs.  Of the PLMs, 1 were associated with arousals.  The Limb Movement index was 12.3 per hour while the PLM index was 11.0 per hour.  Cardiac Summary The average pulse rate was 61.7 bpm.  The minimum pulse rate was 45.0 bpm while the maximum pulse rate was 86.0 bpm.  Cardiac rhythm was normal.  Diagnosis:  Obstructive Sleep Apnea  Recommendations: Recommend a trial of ResMed CPAP at 14cm H2O with heated humidity and Resmed F20 Full Face Mask. The patient should be counseled in good sleep hygiene and avoid sleeping supine. The patient should be counseled to avoid driving when sleeping. Followup in sleep office in 6 weeks.  This study was personally reviewed and electronically signed by: Armanda Magic MD Accredited Board Certified in Sleep Medicine Date/Time: 03/21/2023 6:51PM

## 2023-03-24 ENCOUNTER — Telehealth: Payer: Self-pay | Admitting: *Deleted

## 2023-03-24 ENCOUNTER — Ambulatory Visit (HOSPITAL_COMMUNITY)
Admission: RE | Admit: 2023-03-24 | Discharge: 2023-03-24 | Disposition: A | Discharge status: Home / Self Care | Source: Ambulatory Visit | Attending: Gastroenterology | Admitting: Gastroenterology

## 2023-03-24 DIAGNOSIS — K760 Fatty (change of) liver, not elsewhere classified: Secondary | ICD-10-CM | POA: Diagnosis not present

## 2023-03-24 DIAGNOSIS — K7400 Hepatic fibrosis, unspecified: Secondary | ICD-10-CM | POA: Diagnosis not present

## 2023-03-24 NOTE — Telephone Encounter (Signed)
-----   Message from Armanda Magic sent at 03/21/2023  6:54 PM EDT ----- Please let patient know that they had a successful PAP titration and let DME know that orders are in EPIC.  Please set up 6 week OV with me.

## 2023-03-24 NOTE — Telephone Encounter (Signed)
 The patient has been notified of the result and verbalized understanding.  All questions (if any) were answered. Latrelle Dodrill, CMA 03/24/2023 4:53 PM    Upon patient request DME selection is [employer]. Patient understands he will be contacted by [employer] to set up his cpap. Patient understands to call if [employer] does not contact him with new setup in a timely manner. Patient understands they will be called once confirmation has been received from [facility]  that they have received their new machine to schedule 10 week follow up appointment.   [employer] notified of new cpap order  Please add to airview Patient was grateful for the call and thanked me.

## 2023-03-30 DIAGNOSIS — H905 Unspecified sensorineural hearing loss: Secondary | ICD-10-CM | POA: Diagnosis not present

## 2023-04-06 ENCOUNTER — Encounter: Payer: Self-pay | Admitting: Internal Medicine

## 2023-04-07 ENCOUNTER — Telehealth: Payer: Self-pay | Admitting: Internal Medicine

## 2023-04-07 NOTE — Telephone Encounter (Signed)
 Daughter Alvino Chapel) stated patient has not received her CPAP machine as yet.  Daughter wants a call back to discuss next steps.  Daughter stated can leave voice message and will not be available until Monday (3/31).

## 2023-04-11 NOTE — Telephone Encounter (Signed)
 Per DPR reached out to the daughter to explain it can take up to 15 business days to get a decision back from the insurance and once they have the insurance carriers decision they will call her. The number to dme was provided. Pt is agreeable to treatment.

## 2023-04-12 NOTE — Telephone Encounter (Signed)
 Patient's daughter is returning call. Patient 's daughter stated she called the number that was given to her yesterday and was informed by the company the order was cancelled. Patient's daughter is concerned as to why the order would be cancelled. Please advise.

## 2023-04-13 NOTE — Telephone Encounter (Signed)
 Reached out to [facility] spoke to Grenelefe and she states the patients order was not cancelled, she was not in their system so they had to enter her information in their system as a new patient. The order is being processed.

## 2023-04-21 ENCOUNTER — Telehealth: Payer: Self-pay | Admitting: Cardiology

## 2023-04-21 NOTE — Telephone Encounter (Signed)
 What problem are you experiencing? Still hasn't received C-Pap machine  Who is your medical equipment company? [employer]  3)    If patient is calling about their sleep study results please route to [facility] Sleep Study Pool.  Pt's daughter is very upset that is has not been taken care of. She wants to speak to someone asap! Please route to the sleep study coordinator.

## 2023-04-25 NOTE — Telephone Encounter (Signed)
 Damonie, It takes 15 business days to process your order not including the weekends. Your order got started late even though it was sent on 03/24/23. On 04/13/23 they said they were processing the order so it has only been 8 business days today 04/25/23. I will contact them on 05/05/23 to see where they are in your process. I apologize for the delay in getting your device.

## 2023-05-03 DIAGNOSIS — M25561 Pain in right knee: Secondary | ICD-10-CM | POA: Diagnosis not present

## 2023-05-03 DIAGNOSIS — W19XXXA Unspecified fall, initial encounter: Secondary | ICD-10-CM | POA: Diagnosis not present

## 2023-05-03 DIAGNOSIS — Z13220 Encounter for screening for lipoid disorders: Secondary | ICD-10-CM | POA: Diagnosis not present

## 2023-05-03 DIAGNOSIS — Z1329 Encounter for screening for other suspected endocrine disorder: Secondary | ICD-10-CM | POA: Diagnosis not present

## 2023-05-03 DIAGNOSIS — G2581 Restless legs syndrome: Secondary | ICD-10-CM | POA: Diagnosis not present

## 2023-05-03 DIAGNOSIS — Z130 Encounter for screening for diseases of the blood and blood-forming organs and certain disorders involving the immune mechanism: Secondary | ICD-10-CM | POA: Diagnosis not present

## 2023-05-03 DIAGNOSIS — G4700 Insomnia, unspecified: Secondary | ICD-10-CM | POA: Diagnosis not present

## 2023-05-03 DIAGNOSIS — R7303 Prediabetes: Secondary | ICD-10-CM | POA: Diagnosis not present

## 2023-05-03 DIAGNOSIS — Z79899 Other long term (current) drug therapy: Secondary | ICD-10-CM | POA: Diagnosis not present

## 2023-05-15 ENCOUNTER — Ambulatory Visit: Payer: Self-pay

## 2023-05-15 ENCOUNTER — Institutional Professional Consult (permissible substitution): Payer: 59 | Admitting: Psychology

## 2023-05-15 NOTE — Telephone Encounter (Signed)
 DME  CHANGE:  Upon patient request DME selection is [facility]. Patient understands he will be contacted by [facility] to set up his cpap.[facility]. Patient understands to call if [facility] does not contact him with new setup in a timely manner. Patient understands they will be called once confirmation has been received from [facility] that they have received their new machine to schedule 10 week follow up appointment.  [facility] notified of new cpap order  Please add to airview Patient was grateful for the call and thanked me.

## 2023-05-22 ENCOUNTER — Encounter: Payer: 59 | Admitting: Psychology

## 2023-05-22 NOTE — Telephone Encounter (Addendum)
 Reached out to patient to inquire if she had been contacted by [facility] regarding her cpap machine. She had not been contacted so I reached out to [facility] on her behalf but I was not able to speak to anyone.

## 2023-06-08 ENCOUNTER — Encounter: Payer: Self-pay | Admitting: Gastroenterology

## 2023-06-12 ENCOUNTER — Other Ambulatory Visit: Payer: Self-pay | Admitting: Hematology

## 2023-06-12 DIAGNOSIS — D0511 Intraductal carcinoma in situ of right breast: Secondary | ICD-10-CM

## 2023-07-03 ENCOUNTER — Ambulatory Visit
Admission: EM | Admit: 2023-07-03 | Discharge: 2023-07-03 | Disposition: A | Discharge status: Home / Self Care | Attending: Family Medicine | Admitting: Family Medicine

## 2023-07-03 DIAGNOSIS — H60502 Unspecified acute noninfective otitis externa, left ear: Secondary | ICD-10-CM | POA: Diagnosis not present

## 2023-07-03 MED ORDER — CIPROFLOXACIN-DEXAMETHASONE 0.3-0.1 % OT SUSP: 4.0000 [drp] | Freq: Two times a day (BID) | OTIC | 0 refills | Status: DC

## 2023-07-03 NOTE — ED Triage Notes (Signed)
 Pt reports left ear pain x 1 day. Sharp pains in the ear.

## 2023-07-04 NOTE — ED Provider Notes (Signed)
 [facility]    CSN: [id number] Arrival date & time: 07/03/23  1841      History   Chief Complaint No chief complaint on file.   HPI Cassandra Fowler is a 78 y.o. female.   Patient presenting today with 1 day history of sharp left ear pain.  Denies bleeding, drainage, headache, nausea, dizziness, congestion.  So far not trying anything over-the-counter for symptoms.    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 The report date is 05/09/2020     The Multi-Cancer + RNA Panel offered by [facility] includes sequencing and/or deletion/duplication analysis of the following 84 genes:  AIP*, ALK, APC*, ATM*, AXIN2*,    GERD (gastroesophageal reflux disease)    GIST (gastrointestinal stroma tumor), malignant, colon    s/p resection of 2 tumors in June 2021 at [facility] [place] resection of 2 tumors in June 2021 at [facility] in [place]    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   Pre-diabetes    Pulmonary nodule, left 11/25/2020   6 mm LUL on CT 05/20/2020   Sclerosing adenosis of right breast    Tobacco use 08/05/2021    Patient Active Problem List   Diagnosis Date Noted   Osteopenia after menopause 03/07/2023   Engages in vaping  04/25/2022   HTN (hypertension) 04/25/2022   DOE (dyspnea on exertion) 04/25/2022   Subjective memory complaints    Pre-diabetes 03/30/2022   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   Tobacco use 08/05/2021   Family history of breast cancer 11/25/2020   Family history of colon cancer 11/25/2020   Family history of uterine cancer 11/25/2020   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   Pulmonary nodule, left 11/25/2020   Breast neoplasm, Tis (DCIS), 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   Sclerosing adenosis of right breast    Achalasia 05/13/2020   Fatty liver 05/13/2020   Abnormal gallbladder ultrasound 05/13/2020   Genetic testing 05/09/2020   Family history of lung cancer    Common bile duct dilatation 03/10/2020   Dyslipidemia 03/10/2020   Dysphagia 01/17/2020   GIST (gastrointestinal stroma tumor), malignant, colon 01/17/2020   GERD (gastroesophageal reflux disease) 01/17/2020    Past Surgical History:  Procedure Laterality Date   BREAST BIOPSY Right 09/06/2019   [facility]; evaluation of suspicious calcification of right breast; pathology with atypical ductal hyperplasia, focal cystic dilation of ducts with columnar cell change of epithelium, presence of microcalcifications.   BREAST BIOPSY Right 11/06/2020   Procedure: BREAST BIOPSY;  Surgeon: Mavis Anes, MD;  Location: [facility];  Service: General;  Laterality: Right;   BREAST LUMPECTOMY WITH RADIOFREQUENCY TAG IDENTIFICATION Right 11/06/2020   Procedure: BREAST LUMPECTOMY WITH RADIOFREQUENCY TAG IDENTIFICATION;  Surgeon: Mavis Anes, MD;  Location: [facility];  Service: General;  Laterality: Right;   CATARACT EXTRACTION Right 03/28/2017   [facility] [place]    CATARACT EXTRACTION Left 03/14/2017   [facility] [place]    COLONOSCOPY  01/28/2013   Dr. Debby JINNY Needy, [place], [place]; Hyperplastic polyp x2, sessile serrated adenoma x2, tubular adenoma with low-grade dysplasia x1.  Recommend repeat  colonoscopy in 3 years.   COLONOSCOPY WITH PROPOFOL  N/A 04/02/2020   Surgeon: Shaaron Lamar HERO, MD; Diverticulosis in the sigmoid and descending colon, cecal AVM, three 5-8 millimeters polyps in the rectum, mid rectum, and ascending colon resected and retrieved.  Pathology with 2 tubular adenomas, 1 hyperplastic polyp.  Recommended repeat colonoscopy in 5 years if health permits.   ESOPHAGOGASTRODUODENOSCOPY  04/24/2019   [place] ; 2.4 cm gastric submucosal mass in the fundus consistent with GIST.  FNA consistent with GIST.   ESOPHAGOGASTRODUODENOSCOPY  01/28/2013   Dr. Debby JINNY Devers in [place], [place]; normal esophagus, hernia at GE junction, normal examined stomach, normal examined duodenum.  Does not appear dilation was performed.   ESOPHAGOGASTRODUODENOSCOPY (EGD) WITH PROPOFOL  N/A 04/02/2020   Surgeon: Shaaron Lamar HERO, MD;  Erosive reflux esophagitis, somewhat dilated distal esophagus s/p 60 French Maloney dilation, stigmata of prior gastric surgery, no evidence of recurrent/persisting GIST, normal examined duodenum.    GIST tumor resection  06/19/2019   [facility] [place] , robotic assisted laparoscopic partial gastrectomy x2.  Pathology consistent with GIST   HELLER MYOTOMY  06/19/2019   [facility] [place] ; esophageal myotomy with hiatal hernia repair and dor fundoplication   left hand surgery     MALONEY DILATION N/A 04/02/2020   Procedure: MALONEY DILATION;  Surgeon: Shaaron Lamar HERO, MD;  Location: [facility];  Service: Endoscopy;  Laterality: N/A;   POLYPECTOMY  04/02/2020   Procedure: POLYPECTOMY;  Surgeon: Shaaron Lamar HERO, MD;  Location: [facility];  Service: Endoscopy;;   tubal ligation     UMBILICAL HERNIA REPAIR     1970s   UPPER ESOPHAGEAL ENDOSCOPIC ULTRASOUND (EUS)  04/24/2019   [facility] [place] ; endoscopic findings: Tortuous esophagus s/p balloon dilation to 18 mm with no effect s/p biopsy, submucosal mass in the fundus, normal duodenum;  endosonographic findings: 2.4 cm x 2.4 cm submucosal mass in the fundus s/p FNA, cholelithiasis, no hepatic, CBD, or pancreatic abnormalities.  Pathology: GIST, hepatic parenchyma with mild steato-fibrosis, reflux esophagitis   YAG laser posterior capsulotomy Left 08/30/2018   [facility] [place] , left eye.    OB History   No obstetric history on file.      Home Medications    Prior to Admission medications   Medication Sig Start Date End Date Taking? Authorizing Provider  ciprofloxacin-dexamethasone  (CIPRODEX) OTIC suspension Place 4 drops into the left ear 2 (two) times daily. 07/03/23  Yes Stuart Vernell Norris, PA-C  acetaminophen  (TYLENOL ) 500 MG tablet Take 500 mg by mouth every 6 (six) hours as needed for moderate pain.    [provider]  albuterol  (VENTOLIN  HFA) 108 (90 Base) MCG/ACT inhaler Inhale 2 puffs into the lungs every 4 (four) hours as needed for wheezing or shortness of breath. 11/10/22   Raford Lenis, MD  ascorbic acid (VITAMIN C) 1000 MG tablet Take 1 tablet by mouth daily.    [provider]  Aspirin-Caffeine (BAYER BACK & BODY PO) Take by mouth daily as needed.    [provider]  Calcium  Carbonate-Vitamin D3 600-400 MG-UNIT TABS Take 1 tablet by mouth daily.    [provider]  Cholecalciferol 50 MCG (2000 UT)  TABS Take by mouth daily.    [provider]  cyanocobalamin  (VITAMIN B12) 1000 MCG tablet Take 1,000 mcg by mouth daily.    [provider]  losartan (COZAAR) 50 MG tablet Take 50 mg by mouth daily. 08/22/20   [provider]  ondansetron  (ZOFRAN -ODT) 4 MG disintegrating tablet Take 1 tablet (4 mg total) by mouth every 8 (eight) hours as needed for nausea or vomiting. 01/17/23   Stuart Vernell Norris, PA-C  pantoprazole  (PROTONIX ) 40 MG tablet Take 1 tablet (40 mg total) by mouth daily. 03/19/21   Rudy Josette RAMAN, PA-C  Probiotic Product [facility]) CAPS Take 1 capsule by mouth  daily.    [provider]  rosuvastatin  (CRESTOR ) 10 MG tablet Take 10 mg by mouth daily.    [provider]  tamoxifen  (NOLVADEX ) 20 MG tablet Take 1 tablet by mouth daily. 06/12/23   Katragadda, Sreedhar, MD  tizanidine (ZANAFLEX) 2 MG capsule Take 2 mg by mouth as needed for muscle spasms.    [provider]    Family History Family History  Problem Relation Age of Onset   Stroke Mother    Hypertension Mother    Hearing loss Mother    Cancer Mother        d. 94 where the stomach meets the intestine apple-core   Heart disease Mother    Varicose Veins Mother    Dementia Mother    Stroke Father    Heart disease Father    Hearing loss Father    Lung cancer Brother 47       smoker   Hearing loss Brother    Depression Maternal Grandmother    Diabetes Maternal Grandfather    Colon cancer Paternal Grandmother        dx in her 1s   Hypertension Daughter    Heart disease Daughter    Hearing loss Daughter    Diabetes Daughter    Depression Daughter    Vaginal cancer Daughter        dx <53   Uterine cancer Daughter        dx <53   Breast cancer Daughter        dx <53   Drug abuse Daughter    Kidney disease Daughter    Miscarriages / [place] Daughter    Breast cancer Maternal Aunt        unknown age of diagnosis   Breast cancer Maternal Aunt        unknown age of diagnosis   Breast cancer Cousin        unknown age of diagnosis (maternal first cousin)   Breast cancer Cousin        unknown age of diagnosis (maternal first cousin)   Miscarriages / [place] Granddaughter    Hearing loss Granddaughter    Drug abuse Granddaughter    Depression Granddaughter     Social History Social History   Tobacco Use   Smoking status: Every Day    Types: E-cigarettes   Smokeless tobacco: Never   Tobacco comments:    Daily vaping; 1 cartridge lasts about a month  Vaping Use   Vaping status: Never Used  Substance Use Topics   Alcohol use: Not  Currently    Comment: glass of wine occ   Drug use: Never     Allergies   Penicillins, Somatropin, Tilactase, Tape, Indomethacin, Tolectin [tolmetin], Sulfa antibiotics, and Wound dressing adhesive   Review of Systems Review of Systems Per HPI  Physical Exam Triage Vital Signs ED Triage Vitals  Encounter Vitals Group     BP 07/03/23 1948 127/82     Girls Systolic BP Percentile --      Girls Diastolic BP Percentile --      Boys Systolic BP Percentile --      Boys Diastolic BP Percentile --      Pulse Rate 07/03/23 1948 77     Resp 07/03/23 1948 18     Temp 07/03/23 1948 98.6 F (37 C)     Temp Source 07/03/23 1948 Oral     SpO2 07/03/23 1948 94 %     Weight --      Height --      Head Circumference --      Peak Flow --      Pain Score 07/03/23 1949 8     Pain Loc --      Pain Education --      Exclude from Growth Chart --    No data found.  Updated Vital Signs BP 127/82 (BP Location: Right Arm)   Pulse 77   Temp 98.6 F (37 C) (Oral)   Resp 18   SpO2 94%   Visual Acuity Right Eye Distance:   Left Eye Distance:   Bilateral Distance:    Right Eye Near:   Left Eye Near:    Bilateral Near:     Physical Exam Vitals and nursing note reviewed.  Constitutional:      Appearance: Normal appearance. She is not ill-appearing.  HENT:     Head: Atraumatic.     Right Ear: Tympanic membrane normal.     Ears:     Comments: Left EAC significantly edematous, crusted, erythematous, tender to palpation Minimally able to visualize left TM given extent of edema and pain with insertion of otoscope but what is visible is benign appearing    Mouth/Throat:     Mouth: Mucous membranes are moist.   Eyes:     Extraocular Movements: Extraocular movements intact.     Conjunctiva/sclera: Conjunctivae normal.    Cardiovascular:     Rate and Rhythm: Normal rate.  Pulmonary:     Effort: Pulmonary effort is normal.   Musculoskeletal:        General: Normal range of motion.      Cervical back: Normal range of motion and neck supple.   Skin:    General: Skin is warm and dry.   Neurological:     Mental Status: She is alert and oriented to person, place, and time.   Psychiatric:        Mood and Affect: Mood normal.        Thought Content: Thought content normal.        Judgment: Judgment normal.      UC Treatments / Results  Labs (all labs ordered are listed, but only abnormal results are displayed) Labs Reviewed - No data to display  EKG   Radiology No results found.  Procedures Procedures (including critical care time)  Medications Ordered in UC Medications - No data to display  Initial Impression / Assessment and Plan / UC Course  I have reviewed the triage vital signs and the nursing notes.  Pertinent labs & imaging results that were available during my care of the patient were reviewed by me and considered in my medical decision making (see chart for details).     Consistent with otitis externa, treat with Ciprodex drops, over-the-counter pain relievers, supportive home care.  Return for worsening symptoms.  Final Clinical Impressions(s) / UC Diagnoses   Final diagnoses:  Acute otitis externa of left ear, unspecified type   Discharge Instructions   None    ED Prescriptions     Medication Sig Dispense Auth. Provider   ciprofloxacin-dexamethasone  (CIPRODEX) OTIC suspension Place 4 drops into the left ear 2 (two) times daily. 7.5 mL Stuart Vernell Norris, [place]      PDMP not reviewed this encounter.   Stuart Vernell Norris, [place] 07/04/23 1948

## 2023-07-25 ENCOUNTER — Encounter: Payer: Self-pay | Admitting: Physician Assistant

## 2023-07-25 ENCOUNTER — Ambulatory Visit (INDEPENDENT_AMBULATORY_CARE_PROVIDER_SITE_OTHER): Admitting: Physician Assistant

## 2023-07-25 VITALS — BP 133/84 | HR 95 | Resp 20 | Ht 58.5 in | Wt 155.0 lb

## 2023-07-25 DIAGNOSIS — R4189 Other symptoms and signs involving cognitive functions and awareness: Secondary | ICD-10-CM | POA: Diagnosis not present

## 2023-07-25 NOTE — Progress Notes (Signed)
 Assessment/Plan:      Memory Difficulties Cassandra Fowler is a delightful 78 y.o. LH female with a history of hypertension, hyperlipidemia,  R breast cancer on tamoxifen , GERD, GIST, Prediabetes, OSA on CPAP depression, with neuropsychological evaluation with a functioning generally within normal limits, with overall performance not suggestive of Alzheimer's disease or any other form of neurodegenerative illness at the present, seen today in follow-up for evaluation of memory concerns. Memory is stable, MMSE is  29/30. Patient is not on antidementia medication at this time .  She is little to participate in her ADLs and to drive (although this is less frequent than prior).  Mood is controlled.   Recommendations:   Follow up in 6  months. Recommend good control of cardiovascular risk factors Patient has an appointment on February 2026 with repeat neuropsych evaluation for diagnostic clarity Continue to control mood as per PCP Recommend using hearing aids to improve comprehension    Subjective:   This patient is accompanied in the office by her daughter who supplements the history. Previous records as well as any outside records available were reviewed prior to todays visit.   Patient was last seen on 02/24/23, MOCA 25/30 .    Any changes in memory since last visit? About the same .  She reports some difficulties with recent conversations and names but not different from before .  She is not very active, watches TV , play bingo on the computer, plays on the computer and sleeps most of the day.  repeats oneself?  Endorsed by her daughter, frequently . Disoriented when walking into a room?  Patient denies   Misplacing objects?  Endorsed all over the place  She loses the hearing aids and finds them in the bathroom  Wandering behavior?   Denies. Any personality changes since last visit? Denies.  As before, she is very argumentative with her daughter. Any worsening depression?:   She has a history of depression but was never treated.  She has never had psychotherapy Hallucinations or paranoia?  As before, she states that she has spirits in the house, dead family members live with them get there for a second or two and then they leave but they are not scary to me  Seizures?   Denies.    Any sleep changes?  Does not sleep very well, takes hydroxyzine. She has vivid dreams, denies REM behavior or sleepwalking   Sleep apnea?  OSA, not yet on CPAP, awaiting its delivery  Any hygiene concerns?  She needs to be reminded otherwise 2 weeks may have passed by and she may not take a shower  Independent of bathing and dressing?  Endorsed  Does the patient needs help with medications? Patient is in charge Who is in charge of the finances?  Daughter is in charge Any changes in appetite?  Not eating a lot, I eat when I walk. Patient have trouble swallowing?  She has a history of esophageal stricture, receives periodic Botox. Due for another one soon is starting to bother me again.  Does the patient cook?  Yes, she denies any kitchen accidents Denies.   Any headaches?   Occasional R parietal headaches, takes tylenol  and allegra as needed  Vision changes? Denies. Chronic pain?  And follows, due to arthritis.   Ambulates with difficulty?  She has polyarthritis, does not like using the walker, thus limiting her mobility. Uses the cane when going out.   Recent falls or head injuries?  1 month  ago she  had a mechanical fall as her foot caught the door frame, no head injury of LOC.   Unilateral weakness, numbness or tingling?  Denies.   Any tremors?  Denies.   Any anosmia?    Denies.   Any incontinence of urine?  Urge incontinence, wears Depends Any bowel dysfunction? Occasional diarrhea     Patient lives with her daughter.  Does the patient drive?  Yes, very short distances, less and less.   MRI brain 04/15/2022, personally reviewed  without evidence of acute intracranial  abnormalities, remarkable for mild chronic small vessel ischemic changes within the cerebral white matter, mild generalized parenchymal atrophy     Initial evaluation 03/25/2022  How long did patient have memory difficulties?  About 3 years. Patient has some difficulty remembering recent conversations and people names, although is able to participate in most activities.  She has sometimes trouble recognizing people, or remembering appointments and medications.  Her LTM is better than the short-term memory.  She enjoys doing crossword puzzles and word finding.  She also enjoys drawing and painting. repeats oneself?  Endorsed Disoriented when walking into a room?  Patient denies, trying to adapt to the new home, as they recently moved from [place].   Leaving objects in unusual places?  Endorsed, for example losing the phone and finding it in the microwave, coffee cups in the oven etc.  Wandering behavior? denies   Any personality changes since last visit?  Patient denies, but daughter reports that she has more mood swings for the last 1-1/2 years, and she can get argumentative.   Any history of depression?: Endorsed, depression runs in the family , although she has not been formally diagnosed or treated.  She does not have a psychotherapist. Hallucinations or paranoia?  denies   Seizures? denies    Any sleep changes?  Most of the time -daughter says. Denies  vivid dreams, REM behavior or sleepwalking   Sleep apnea? They are going  to check on that because she snores very loud Any hygiene concerns? Endorsed for the last 1.5 y, needs reminder, 2 weeks come back by and she does not take a shower .  Independent of bathing and dressing?  Endorsed, has arthritis so she needs help with buttoning.  Does the patient need help with medications?  Daughter is in charge over the last 6 months because she was missing doses. She is now on pillpack's  Who is in charge of the finances?  Daughter is in charge  over the last 5 months because she was missing payments Any changes in appetite?  Eats well, she may forget to eat at times, but she may be able to snack.    Patient have trouble swallowing?  denies   Does the patient cook?  Any kitchen accidents such as leaving the stove on? Patient denies   Any headaches? She has GERD and achalasia so she had esophageal dilatation and  Botox injections with good results  Chronic  pain?  Endorsed due to arthritis  Ambulates with difficulty? Uses a walker and cane for stability. She is going to try PT as OP  Recent falls or head injuries? denies     Vision changes? Had scar tissue on the R after cataracts, L has retinal detachment history, defers eye surgery because she has to sleep on her stomach  for 30 days and does not want to do it.   Unilateral weakness, numbness or tingling?  denies   Any tremors?  Denies  Any anosmia?  Always been off Any incontinence of urine?Endorsed, uses Depends  Any bowel dysfunction?    denies      Patient lives  daughter  History of heavy alcohol intake? denies   History of heavy tobacco use? denies   Family history of dementia?  Her mother had dementia ?type  Dose patient drive? short distances, only during the day, not frequently , lately she does not feel comfortable anymore    Neuropsychological evaluation on 03/31/2022. Briefly, results suggested neuropsychological functioning generally within normal limits relative to age-matched peers. Some performance variability was exhibited across executive functioning, particularly surrounding cognitive flexibility and response inhibition. Performances across tasks assessing verbal reasoning and safety/judgment were appropriate. Performances were also appropriate across processing speed, attention/concentration, receptive and expressive language, visuospatial abilities, and all aspects of learning and memory.        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 The report date is 05/09/2020     The Multi-Cancer + RNA Panel offered by [facility] includes sequencing and/or deletion/duplication analysis of the following 84 genes:  AIP*, ALK, APC*, ATM*, AXIN2*,    GERD (gastroesophageal reflux disease)    GIST (gastrointestinal stroma tumor), malignant, colon    s/p resection [facility] 2 [facility] [place] [facility] [place] tumors in June 2021 at 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   BREAST BIOPSY Right 11/06/2020   Procedure: BREAST BIOPSY;  Surgeon: Mavis Anes, MD;  Location: [facility];  Service: General;  Laterality: Right;   BREAST LUMPECTOMY WITH RADIOFREQUENCY TAG IDENTIFICATION Right 11/06/2020   Procedure: BREAST LUMPECTOMY WITH  RADIOFREQUENCY TAG IDENTIFICATION;  Surgeon: Mavis Anes, MD;  Location: [facility];  Service: General;  Laterality: Right;   CATARACT EXTRACTION Right 03/28/2017   [facility] [place]    CATARACT EXTRACTION Left 03/14/2017   [facility] [place]    COLONOSCOPY  01/28/2013   Dr. Debby JINNY Needy, [place], [place]; Hyperplastic polyp x2, sessile serrated adenoma x2, tubular adenoma with low-grade dysplasia x1.  Recommend repeat colonoscopy in 3 years.   COLONOSCOPY WITH PROPOFOL  N/A 04/02/2020   Surgeon: Shaaron Lamar HERO, MD; Diverticulosis in the sigmoid and descending colon, cecal AVM, three 5-8 millimeters polyps in the rectum, mid rectum, and ascending colon resected  and retrieved.  Pathology with 2 tubular adenomas, 1 hyperplastic polyp.  Recommended repeat colonoscopy in 5 years if health permits.   ESOPHAGOGASTRODUODENOSCOPY  04/24/2019   [place] ; 2.4 cm gastric submucosal mass in the fundus consistent with GIST.  FNA consistent with GIST.   ESOPHAGOGASTRODUODENOSCOPY  01/28/2013   Dr. Debby PARAS Devers in [place], [place]; normal esophagus, hernia at GE junction, normal examined stomach, normal examined duodenum.  Does not appear dilation was performed.   ESOPHAGOGASTRODUODENOSCOPY (EGD) WITH PROPOFOL  N/A 04/02/2020   Surgeon: Shaaron Lamar HERO, MD;  Erosive reflux esophagitis, somewhat dilated distal esophagus s/p 60 French Maloney dilation, stigmata of prior gastric surgery, no evidence of recurrent/persisting GIST, normal examined duodenum.    GIST tumor resection  06/19/2019   [facility] [place] , robotic assisted laparoscopic partial gastrectomy x2.  Pathology consistent with GIST   HELLER MYOTOMY  06/19/2019   [facility] [place] ; esophageal myotomy with hiatal hernia repair and dor fundoplication   left hand surgery     MALONEY DILATION N/A 04/02/2020   Procedure: MALONEY DILATION;  Surgeon: Shaaron Lamar HERO, MD;  Location: [facility];  Service: Endoscopy;   Laterality: N/A;   POLYPECTOMY  04/02/2020   Procedure: POLYPECTOMY;  Surgeon: Shaaron Lamar HERO, MD;  Location: [facility];  Service: Endoscopy;;   tubal ligation     UMBILICAL HERNIA REPAIR     1970s   UPPER ESOPHAGEAL ENDOSCOPIC ULTRASOUND (EUS)  04/24/2019   [facility] [place] ; endoscopic findings: Tortuous esophagus s/p balloon dilation to 18 mm with no effect s/p biopsy, submucosal mass in the fundus, normal duodenum; endosonographic findings: 2.4 cm x 2.4 cm submucosal mass in the fundus s/p FNA, cholelithiasis, no hepatic, CBD, or pancreatic abnormalities.  Pathology: GIST, hepatic parenchyma with mild steato-fibrosis, reflux esophagitis   YAG laser posterior capsulotomy Left 08/30/2018   [facility] [place] , left eye.     PREVIOUS MEDICATIONS:   CURRENT MEDICATIONS:  Outpatient Encounter Medications as of 07/25/2023  Medication Sig   acetaminophen  (TYLENOL ) 500 MG tablet Take 500 mg by mouth every 6 (six) hours as needed for moderate pain.   albuterol  (VENTOLIN  HFA) 108 (90 Base) MCG/ACT inhaler Inhale 2 puffs into the lungs every 4 (four) hours as needed for wheezing or shortness of breath.   ascorbic acid (VITAMIN C) 1000 MG tablet Take 1 tablet by mouth daily.   Aspirin-Caffeine (BAYER BACK & BODY PO) Take by mouth daily as needed.   Calcium  Carbonate-Vitamin D3 600-400 MG-UNIT TABS Take 1 tablet by mouth daily.   Cholecalciferol 50 MCG (2000 UT) TABS Take by mouth daily.   ciprofloxacin -dexamethasone  (CIPRODEX ) OTIC suspension Place 4 drops into the left ear 2 (two) times daily.   cyanocobalamin  (VITAMIN B12) 1000 MCG tablet Take 1,000 mcg by mouth daily.   losartan (COZAAR) 50 MG tablet Take 50 mg by mouth daily.   ondansetron  (ZOFRAN -ODT) 4 MG disintegrating tablet Take 1 tablet (4 mg total) by mouth every 8 (eight) hours as needed for nausea or vomiting.   pantoprazole  (PROTONIX ) 40 MG tablet Take 1 tablet (40 mg total) by mouth daily.   Probiotic Product  (PHILLIPS COLON HEALTH) CAPS Take 1 capsule by mouth daily.   rosuvastatin  (CRESTOR ) 10 MG tablet Take 10 mg by mouth daily.   tamoxifen  (NOLVADEX ) 20 MG tablet Take 1 tablet by mouth daily.   tizanidine (ZANAFLEX) 2 MG capsule Take 2 mg by mouth as needed for muscle spasms.   No facility-administered encounter medications on file as of 07/25/2023.  Objective:     PHYSICAL EXAMINATION:    VITALS:   Vitals:   07/25/23 0919  BP: 133/84  Pulse: 95  Resp: 20  SpO2: 98%  Weight: 155 lb (70.3 kg)  Height: 4' 10.5 (1.486 m)    GEN:  The patient appears stated age and is in NAD. HEENT:  Normocephalic, atraumatic.   Neurological examination:  General: NAD, well-groomed, appears stated age. Orientation: The patient is alert. Oriented to person, place and date. Cranial nerves: There is good facial symmetry, edentulous.  The speech is fluent and clear. No aphasia or dysarthria. Fund of knowledge is appropriate. Recent and remote memory is normal.  Attention and concentration are normal.  Able to name objects and repeat phrases.  Hearing is decreased to conversational tone .   Delayed recall 3/3 Sensation: Sensation is intact to light touch throughout Motor: Strength is at least antigravity x4. DTR's 1/4 in UE/LE      03/25/2022    8:00 AM  Montreal Cognitive Assessment   Visuospatial/ Executive (0/5) 4  Naming (0/3) 3  Attention: Read list of digits (0/2) 2  Attention: Read list of letters (0/1) 1  Attention: Serial 7 subtraction starting at 100 (0/3) 3  Language: Repeat phrase (0/2) 2  Language : Fluency (0/1) 1  Abstraction (0/2) 0  Delayed Recall (0/5) 3  Orientation (0/6) 6  Total 25  Adjusted Score (based on education) 25       07/25/2023    9:00 AM 02/24/2023   12:00 PM  MMSE - Mini Mental State Exam  Orientation to time 5 5  Orientation to Place 5 5  Registration 3 3  Attention/ Calculation 5 5  Recall 3 3  Language- name 2 objects 2 2  Language- repeat 1 1   Language- follow 3 step command 3 3  Language- read & follow direction 1 1  Write a sentence 1 1  Copy design 1 1  Total score 30 30       Movement examination: Tone: There is normal tone in the UE/LE Abnormal movements:  no tremor.  No myoclonus.  No asterixis.   Coordination:  There is no decremation with RAM's. Normal finger to nose.  Severe arthritic changes in both hands. Gait and Station: The patient has no difficulty arising out of a deep-seated chair without the use of the hands. The patient's stride length is good, uses cane for stability.  Gait is cautious and narrow.   Thank you for allowing us  the opportunity to participate in the care of this nice patient. Please do not hesitate to contact us  for any questions or concerns.   Total time spent on today's visit was 27 minutes dedicated to this patient today, preparing to see patient, examining the patient, ordering tests and/or medications and counseling the patient, documenting clinical information in the EHR or other health record, independently interpreting results and communicating results to the patient/family, discussing treatment and goals, answering patient's questions and coordinating care.  Cc:  Gerome Tillman CROME, FNP  Cassandra Fowler 07/25/2023 9:47 AM

## 2023-07-25 NOTE — Patient Instructions (Signed)
 It was a pleasure to see you today at our office.   Recommendations:  Neurocognitive evaluation is scheduled for Feb 2026 Use the hearing aids  Agree with CPAP Follow up  after neuropsych evaluation    Whom to call:  Memory  decline, memory medications: Call our office [phone]   For psychiatric meds, mood meds: Please have your primary care physician manage these medications.       For assessment of decision of mental capacity and competency:  Call Dr. Rosaline Nine, geriatric psychiatrist at [phone]  For guidance in geriatric dementia issues please call [facility] [phone]     If you have any severe symptoms of a stroke, or other severe issues such as confusion,severe chills or fever, etc call 911 or go to the ER as you may need to be evaluated further        You have been referred for a neuropsychological evaluation (i.e., evaluation of memory and thinking abilities). Please bring someone with you to this appointment if possible, as it is helpful for the doctor to hear from both you and another adult who knows you well. Please bring eyeglasses and hearing aids if you wear them.    The evaluation will take approximately 3 hours and has two parts:   The first part is a clinical interview with the neuropsychologist (Dr. Richie or Dr. Jackquline). During the interview, the neuropsychologist will speak with you and the individual you brought to the appointment.    The second part of the evaluation is testing with the doctor's technician Neal or Luke). During the testing, the technician will ask you to remember different types of material, solve problems, and answer some questionnaires. Your family member will not be present for this portion of the evaluation.   Please note: We must reserve several hours of the neuropsychologist's time and the psychometrician's time for your evaluation appointment. As such, there is a No-Show fee of $100. If you are unable to  attend any of your appointments, please contact our office as soon as possible to reschedule.      RECOMMENDATIONS FOR ALL PATIENTS WITH MEMORY PROBLEMS: 1. Continue to exercise (Recommend 30 minutes of walking everyday, or 3 hours every week) 2. Increase social interactions - continue going to [place] and enjoy social gatherings with friends and family 3. Eat healthy, avoid fried foods and eat more fruits and vegetables 4. Maintain adequate blood pressure, blood sugar, and blood cholesterol level. Reducing the risk of stroke and cardiovascular disease also helps promoting better memory. 5. Avoid stressful situations. Live a simple life and avoid aggravations. Organize your time and prepare for the next day in anticipation. 6. Sleep well, avoid any interruptions of sleep and avoid any distractions in the bedroom that may interfere with adequate sleep quality 7. Avoid sugar, avoid sweets as there is a strong link between excessive sugar intake, diabetes, and cognitive impairment We discussed the Mediterranean diet, which has been shown to help patients reduce the risk of progressive memory disorders and reduces cardiovascular risk. This includes eating fish, eat fruits and green leafy vegetables, nuts like almonds and hazelnuts, walnuts, and also use olive oil. Avoid fast foods and fried foods as much as possible. Avoid sweets and sugar as sugar use has been linked to worsening of memory function.  There is always a concern of gradual progression of memory problems. If this is the case, then we may need to adjust level of care according to patient needs. Support, both  to the patient and caregiver, should then be put into place.      FALL PRECAUTIONS: Be cautious when walking. Scan the area for obstacles that may increase the risk of trips and falls. When getting up in the mornings, sit up at the edge of the bed for a few minutes before getting out of bed. Consider elevating the bed at the head end  to avoid drop of blood pressure when getting up. Walk always in a well-lit room (use night lights in the walls). Avoid area rugs or power cords from appliances in the middle of the walkways. Use a walker or a cane if necessary and consider physical therapy for balance exercise. Get your eyesight checked regularly.  FINANCIAL OVERSIGHT: Supervision, especially oversight when making financial decisions or transactions is also recommended.  HOME SAFETY: Consider the safety of the kitchen when operating appliances like stoves, microwave oven, and blender. Consider having supervision and share cooking responsibilities until no longer able to participate in those. Accidents with firearms and other hazards in the house should be identified and addressed as well.   ABILITY TO BE LEFT ALONE: If patient is unable to contact 911 operator, consider using LifeLine, or when the need is there, arrange for someone to stay with patients. Smoking is a fire hazard, consider supervision or cessation. Risk of wandering should be assessed by caregiver and if detected at any point, supervision and safe proof recommendations should be instituted.  MEDICATION SUPERVISION: Inability to self-administer medication needs to be constantly addressed. Implement a mechanism to ensure safe administration of the medications.   DRIVING: Regarding driving, in patients with progressive memory problems, driving will be impaired. We advise to have someone else do the driving if trouble finding directions or if minor accidents are reported. Independent driving assessment is available to determine safety of driving.   If you are interested in the driving assessment, you can contact the following:  [facility] [employer] [facility] in [place] [phone]  [facility] [phone]  [facility] [phone] or [phone]    Mediterranean Diet A Mediterranean diet refers to  food and lifestyle choices that are based on the traditions of countries located on the [employer] [place]. This way of eating has been shown to help prevent certain conditions and improve outcomes for people who have chronic diseases, like kidney disease and heart disease. What are tips for following this plan? Lifestyle  Cook and eat meals together with your family, when possible. Drink enough fluid to keep your urine clear or pale yellow. Be physically active every day. This includes: Aerobic exercise like running or swimming. Leisure activities like gardening, walking, or housework. Get 7-8 hours of sleep each night. If recommended by your health care provider, drink red wine in moderation. This means 1 glass a day for nonpregnant women and 2 glasses a day for men. A glass of wine equals 5 oz (150 mL). Reading food labels  Check the serving size of packaged foods. For foods such as rice and pasta, the serving size refers to the amount of cooked product, not dry. Check the total fat in packaged foods. Avoid foods that have saturated fat or trans fats. Check the ingredients list for added sugars, such as corn syrup. Shopping  At the grocery store, buy most of your food from the areas near the walls of the store. This includes: Fresh fruits and vegetables (produce). Grains, beans, nuts, and seeds. Some of these may be available in unpackaged  forms or large amounts (in bulk). Fresh seafood. Poultry and eggs. Low-fat dairy products. Buy whole ingredients instead of prepackaged foods. Buy fresh fruits and vegetables in-season from local farmers markets. Buy frozen fruits and vegetables in resealable bags. If you do not have access to quality fresh seafood, buy precooked frozen shrimp or canned fish, such as tuna, salmon, or sardines. Buy small amounts of raw or cooked vegetables, salads, or olives from the deli or salad bar at your store. Stock your pantry so you always have certain foods on  hand, such as olive oil, canned tuna, canned tomatoes, rice, pasta, and beans. Cooking  Cook foods with extra-virgin olive oil instead of using butter or other vegetable oils. Have meat as a side dish, and have vegetables or grains as your main dish. This means having meat in small portions or adding small amounts of meat to foods like pasta or stew. Use beans or vegetables instead of meat in common dishes like chili or lasagna. Experiment with different cooking methods. Try roasting or broiling vegetables instead of steaming or sauteing them. Add frozen vegetables to soups, stews, pasta, or rice. Add nuts or seeds for added healthy fat at each meal. You can add these to yogurt, salads, or vegetable dishes. Marinate fish or vegetables using olive oil, lemon juice, garlic, and fresh herbs. Meal planning  Plan to eat 1 vegetarian meal one day each week. Try to work up to 2 vegetarian meals, if possible. Eat seafood 2 or more times a week. Have healthy snacks readily available, such as: Vegetable sticks with hummus. Greek yogurt. Fruit and nut trail mix. Eat balanced meals throughout the week. This includes: Fruit: 2-3 servings a day Vegetables: 4-5 servings a day Low-fat dairy: 2 servings a day Fish, poultry, or lean meat: 1 serving a day Beans and legumes: 2 or more servings a week Nuts and seeds: 1-2 servings a day Whole grains: 6-8 servings a day Extra-virgin olive oil: 3-4 servings a day Limit red meat and sweets to only a few servings a month What are my food choices? Mediterranean diet Recommended Grains: Whole-grain pasta. Brown rice. Bulgar wheat. Polenta. Couscous. Whole-wheat bread. [place] Madeira. Vegetables: Artichokes. Beets. Broccoli. Cabbage. Carrots. Eggplant. Green beans. Chard. Kale. Spinach. Onions. Leeks. Peas. Squash. Tomatoes. Peppers. Radishes. Fruits: Apples. Apricots. Avocado. Berries. Bananas. Cherries. Dates. Figs. Grapes. Lemons. Melon. Oranges. Peaches.  Plums. Pomegranate. Meats and other protein foods: Beans. Almonds. Sunflower seeds. Pine nuts. Peanuts. Cod. Salmon. Scallops. Shrimp. Tuna. Tilapia. Clams. Oysters. Eggs. Dairy: Low-fat milk. Cheese. Greek yogurt. Beverages: Water. Red wine. Herbal tea. Fats and oils: Extra virgin olive oil. Avocado oil. Grape seed oil. Sweets and desserts: [place] yogurt with honey. Baked apples. Poached pears. Trail mix. Seasoning and other foods: Basil. Cilantro. Coriander. Cumin. Mint. Parsley. Sage. Rosemary. Tarragon. Garlic. Oregano. Thyme. Pepper. Balsalmic vinegar. Tahini. Hummus. Tomato sauce. Olives. Mushrooms. Limit these Grains: Prepackaged pasta or rice dishes. Prepackaged cereal with added sugar. Vegetables: Deep fried potatoes (french fries). Fruits: Fruit canned in syrup. Meats and other protein foods: Beef. Pork. Lamb. Poultry with skin. Hot dogs. [place]. Dairy: Ice cream. Sour cream. Whole milk. Beverages: Juice. Sugar-sweetened soft drinks. Beer. Liquor and spirits. Fats and oils: Butter. Canola oil. Vegetable oil. Beef fat (tallow). Lard. Sweets and desserts: Cookies. Cakes. Pies. Candy. Seasoning and other foods: Mayonnaise. Premade sauces and marinades. The items listed may not be a complete list. Talk with your dietitian about what dietary choices are right for you. Summary The Mediterranean diet includes  both food and lifestyle choices. Eat a variety of fresh fruits and vegetables, beans, nuts, seeds, and whole grains. Limit the amount of red meat and sweets that you eat. Talk with your health care provider about whether it is safe for you to drink red wine in moderation. This means 1 glass a day for nonpregnant women and 2 glasses a day for men. A glass of wine equals 5 oz (150 mL). This information is not intended to replace advice given to you by your health care provider. Make sure you discuss any questions you have with your health care provider. Document Released: 08/20/2015  Document Revised: 09/22/2015 Document Reviewed: 08/20/2015 Elsevier Interactive Patient Education  2017 [employer].   [address] [facility] [phone]

## 2023-07-27 ENCOUNTER — Other Ambulatory Visit: Payer: Self-pay

## 2023-07-27 ENCOUNTER — Emergency Department (HOSPITAL_COMMUNITY)
Admission: EM | Admit: 2023-07-27 | Discharge: 2023-07-28 | Disposition: A | Discharge status: Home / Self Care | Attending: Emergency Medicine | Admitting: Emergency Medicine

## 2023-07-27 ENCOUNTER — Encounter (HOSPITAL_COMMUNITY): Payer: Self-pay | Admitting: Emergency Medicine

## 2023-07-27 ENCOUNTER — Emergency Department (HOSPITAL_COMMUNITY)

## 2023-07-27 DIAGNOSIS — I6201 Nontraumatic acute subdural hemorrhage: Secondary | ICD-10-CM | POA: Diagnosis not present

## 2023-07-27 DIAGNOSIS — Z79899 Other long term (current) drug therapy: Secondary | ICD-10-CM | POA: Diagnosis not present

## 2023-07-27 DIAGNOSIS — W19XXXA Unspecified fall, initial encounter: Secondary | ICD-10-CM | POA: Diagnosis not present

## 2023-07-27 DIAGNOSIS — F17210 Nicotine dependence, cigarettes, uncomplicated: Secondary | ICD-10-CM | POA: Diagnosis not present

## 2023-07-27 DIAGNOSIS — I6782 Cerebral ischemia: Secondary | ICD-10-CM | POA: Diagnosis not present

## 2023-07-27 DIAGNOSIS — Y92007 Garden or yard of unspecified non-institutional (private) residence as the place of occurrence of the external cause: Secondary | ICD-10-CM | POA: Insufficient documentation

## 2023-07-27 DIAGNOSIS — S199XXA Unspecified injury of neck, initial encounter: Secondary | ICD-10-CM | POA: Diagnosis not present

## 2023-07-27 DIAGNOSIS — Z853 Personal history of malignant neoplasm of breast: Secondary | ICD-10-CM | POA: Diagnosis not present

## 2023-07-27 DIAGNOSIS — S065X0A Traumatic subdural hemorrhage without loss of consciousness, initial encounter: Secondary | ICD-10-CM | POA: Diagnosis not present

## 2023-07-27 DIAGNOSIS — W01198A Fall on same level from slipping, tripping and stumbling with subsequent striking against other object, initial encounter: Secondary | ICD-10-CM | POA: Insufficient documentation

## 2023-07-27 DIAGNOSIS — I10 Essential (primary) hypertension: Secondary | ICD-10-CM | POA: Diagnosis not present

## 2023-07-27 DIAGNOSIS — G319 Degenerative disease of nervous system, unspecified: Secondary | ICD-10-CM | POA: Diagnosis not present

## 2023-07-27 DIAGNOSIS — Y9389 Activity, other specified: Secondary | ICD-10-CM | POA: Insufficient documentation

## 2023-07-27 DIAGNOSIS — S0990XA Unspecified injury of head, initial encounter: Secondary | ICD-10-CM | POA: Diagnosis not present

## 2023-07-27 LAB — BASIC METABOLIC PANEL WITH GFR
Anion gap: 12 (ref 5–15)
BUN: 11 mg/dL (ref 8–23)
CO2: 24 mmol/L (ref 22–32)
Calcium: 9.1 mg/dL (ref 8.9–10.3)
Chloride: 108 mmol/L (ref 98–111)
Creatinine, Ser: 0.67 mg/dL (ref 0.44–1.00)
GFR, Estimated: >60 mL/min (ref >60)
Glucose, Bld: 81 mg/dL (ref 70–99)
Potassium: 3.5 mmol/L (ref 3.5–5.1)
Sodium: 144 mmol/L (ref 135–145)

## 2023-07-27 LAB — CBC
HCT: 40.8 % (ref 36.0–46.0)
Hemoglobin: 13.4 g/dL (ref 12.0–15.0)
MCH: 31 pg (ref 26.0–34.0)
MCHC: 32.8 g/dL (ref 30.0–36.0)
MCV: 94.4 fL (ref 80.0–100.0)
Platelets: 143 K/uL — ABNORMAL LOW (ref 150–400)
RBC: 4.32 MIL/uL (ref 3.87–5.11)
RDW: 13.1 % (ref 11.5–15.5)
WBC: 11.7 K/uL — ABNORMAL HIGH (ref 4.0–10.5)
nRBC: 0 % (ref 0.0–0.2)

## 2023-07-27 MED ORDER — ACETAMINOPHEN 500 MG PO TABS
1000.0000 mg | ORAL_TABLET | Freq: Once | ORAL | Status: AC
  Administered 2023-07-28: 1000 mg via ORAL
  Filled 2023-07-27: qty 2

## 2023-07-27 NOTE — Progress Notes (Signed)
 Erroneous encounter

## 2023-07-27 NOTE — ED Provider Notes (Signed)
 [place] [facility] AT [facility] Provider Note  CSN: [id number] Arrival date & time: 07/27/23 1930  Chief Complaint(s) Fall  HPI Cassandra Fowler is a 78 y.o. female with past medical history as below, significant for LD, GERD, tobacco use, prediabetes who presents to the ED with complaint of fall, head injury   Pt was taking out her recycling, garbage bag was heaving and while lifting it she lost her balance and fell backwards and hit her head on the ground, grass. No LOC. She is on ASA but no thinners. No loc, she had headache initially which has dissipated. No numbness or weakness to extremities, no vision changes..  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 The report date is 05/09/2020     The Multi-Cancer + RNA Panel offered by [facility] includes sequencing and/or deletion/duplication analysis of the following 84 genes:  AIP*, ALK, APC*, ATM*, AXIN2*,    GERD (gastroesophageal reflux disease)    GIST (gastrointestinal stroma tumor), malignant, colon    s/p resection of 2 [facility] [place] tumors in June 2021 at  [facility] in [place]    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   Pre-diabetes    Pulmonary nodule, left 11/25/2020   6 mm LUL on CT 05/20/2020   Sclerosing adenosis of right breast    Tobacco use 08/05/2021   Patient Active Problem List   Diagnosis Date Noted   Osteopenia after menopause 03/07/2023   Engages in vaping 04/25/2022   HTN (hypertension) 04/25/2022   DOE (dyspnea on exertion) 04/25/2022   Subjective memory complaints    Pre-diabetes 03/30/2022   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   Tobacco use 08/05/2021   Family history of breast cancer 11/25/2020   Family history of colon cancer 11/25/2020   Family history of uterine cancer 11/25/2020   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   Pulmonary nodule, left 11/25/2020   Breast neoplasm, Tis (DCIS), 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 Authorizing Provider  acetaminophen  (TYLENOL ) 500 MG tablet Take 500 mg by mouth every 6 (six) hours as needed for moderate pain.    [provider]  albuterol  (  VENTOLIN  HFA) 108 (90 Base) MCG/ACT inhaler Inhale 2 puffs into the lungs every 4 (four) hours as needed for wheezing or shortness of breath. 11/10/22   Raford Lenis, MD  ascorbic acid (VITAMIN C) 1000 MG tablet Take 1 tablet by mouth daily.    [provider]  Aspirin-Caffeine (BAYER BACK & BODY PO) Take by mouth daily as needed.    [provider]  Calcium  Carbonate-Vitamin D3 600-400 MG-UNIT  TABS Take 1 tablet by mouth daily.    [provider]  Cholecalciferol 50 MCG (2000 UT) TABS Take by mouth daily.    [provider]  ciprofloxacin -dexamethasone  (CIPRODEX ) OTIC suspension Place 4 drops into the left ear 2 (two) times daily. 07/03/23   Stuart Vernell Norris, PA-C  cyanocobalamin  (VITAMIN B12) 1000 MCG tablet Take 1,000 mcg by mouth daily.    [provider]  losartan (COZAAR) 50 MG tablet Take 50 mg by mouth daily. 08/22/20   [provider]  ondansetron  (ZOFRAN -ODT) 4 MG disintegrating tablet Take 1 tablet (4 mg total) by mouth every 8 (eight) hours as needed for nausea or vomiting. 01/17/23   Stuart Vernell Norris, PA-C  pantoprazole  (PROTONIX ) 40 MG tablet Take 1 tablet (40 mg total) by mouth daily. 03/19/21   Rudy Josette RAMAN, PA-C  Probiotic Product [facility] COLON HEALTH) CAPS Take 1 capsule by mouth daily.    [provider]  rosuvastatin  (CRESTOR ) 10 MG tablet Take 10 mg by mouth daily.    [provider]  tamoxifen  (NOLVADEX ) 20 MG tablet Take 1 tablet by mouth daily. 06/12/23   Katragadda, Sreedhar, MD  tizanidine (ZANAFLEX) 2 MG capsule Take 2 mg by mouth as needed for muscle spasms.    [provider]                                                                                                                                    Past Surgical History Past Surgical History:  Procedure Laterality Date   BREAST BIOPSY Right 09/06/2019   [facility]; evaluation of suspicious calcification of right breast; pathology with atypical ductal hyperplasia, focal cystic dilation of ducts with columnar cell change of epithelium, presence of microcalcifications.   BREAST BIOPSY Right 11/06/2020   Procedure: BREAST BIOPSY;  Surgeon: Mavis Anes, MD;  Location: [facility];  Service: General;  Laterality: Right;   BREAST LUMPECTOMY WITH RADIOFREQUENCY TAG IDENTIFICATION Right 11/06/2020   Procedure: BREAST LUMPECTOMY WITH  RADIOFREQUENCY TAG IDENTIFICATION;  Surgeon: Mavis Anes, MD;  Location: [facility];  Service: General;  Laterality: Right;   CATARACT EXTRACTION Right 03/28/2017   [facility] [place]    CATARACT EXTRACTION Left 03/14/2017   [facility] [place]    COLONOSCOPY  01/28/2013   Dr. Debby JINNY Needy, [place], [place]; Hyperplastic polyp x2, sessile serrated adenoma x2, tubular adenoma with low-grade dysplasia x1.  Recommend repeat colonoscopy in 3 years.   COLONOSCOPY WITH PROPOFOL  N/A 04/02/2020  Surgeon: Shaaron Lamar HERO, MD; Diverticulosis in the sigmoid and descending colon, cecal AVM, three 5-8 millimeters polyps in the rectum, mid rectum, and ascending colon resected and retrieved.  Pathology with 2 tubular adenomas, 1 hyperplastic polyp.  Recommended repeat colonoscopy in 5 years if health permits.   ESOPHAGOGASTRODUODENOSCOPY  04/24/2019   [place] ; 2.4 cm gastric submucosal mass in the fundus consistent with GIST.  FNA consistent with GIST.   ESOPHAGOGASTRODUODENOSCOPY  01/28/2013   Dr. Debby PARAS Devers in [place], [place]; normal esophagus, hernia at GE junction, normal examined stomach, normal examined duodenum.  Does not appear dilation was performed.   ESOPHAGOGASTRODUODENOSCOPY (EGD) WITH PROPOFOL  N/A 04/02/2020   Surgeon: Shaaron Lamar HERO, MD;  Erosive reflux esophagitis, somewhat dilated distal esophagus s/p 60 French Maloney dilation, stigmata of prior gastric surgery, no evidence of recurrent/persisting GIST, normal examined duodenum.    GIST tumor resection  06/19/2019   [facility] [place] , robotic assisted laparoscopic partial gastrectomy x2.  Pathology consistent with GIST   HELLER MYOTOMY  06/19/2019   [facility] [place] ; esophageal myotomy with hiatal hernia repair and dor fundoplication   left hand surgery     MALONEY DILATION N/A 04/02/2020   Procedure: MALONEY DILATION;  Surgeon: Shaaron Lamar HERO, MD;  Location: [facility];  Service: Endoscopy;   Laterality: N/A;   POLYPECTOMY  04/02/2020   Procedure: POLYPECTOMY;  Surgeon: Shaaron Lamar HERO, MD;  Location: [facility];  Service: Endoscopy;;   tubal ligation     UMBILICAL HERNIA REPAIR     1970s   UPPER ESOPHAGEAL ENDOSCOPIC ULTRASOUND (EUS)  04/24/2019   [facility] [place] ; endoscopic findings: Tortuous esophagus s/p balloon dilation to 18 mm with no effect s/p biopsy, submucosal mass in the fundus, normal duodenum; endosonographic findings: 2.4 cm x 2.4 cm submucosal mass in the fundus s/p FNA, cholelithiasis, no hepatic, CBD, or pancreatic abnormalities.  Pathology: GIST, hepatic parenchyma with mild steato-fibrosis, reflux esophagitis   YAG laser posterior capsulotomy Left 08/30/2018   [facility] [place] , left eye.   Family History Family History  Problem Relation Age of Onset   Stroke Mother    Hypertension Mother    Hearing loss Mother    Cancer Mother        d. 94 where the stomach meets the intestine apple-core   Heart disease Mother    Varicose Veins Mother    Dementia Mother    Stroke Father    Heart disease Father    Hearing loss Father    Lung cancer Brother 93       smoker   Hearing loss Brother    Depression Maternal Grandmother    Diabetes Maternal Grandfather    Colon cancer Paternal Grandmother        dx in her 85s   Hypertension Daughter    Heart disease Daughter    Hearing loss Daughter    Diabetes Daughter    Depression Daughter    Vaginal cancer Daughter        dx <53   Uterine cancer Daughter        dx <53   Breast cancer Daughter        dx <53   Drug abuse Daughter    Kidney disease Daughter    Miscarriages / [place] Daughter    Breast cancer Maternal Aunt        unknown age of diagnosis   Breast cancer Maternal Aunt        unknown age of diagnosis  Breast cancer Cousin        unknown age of diagnosis (maternal first cousin)   Breast cancer Cousin        unknown age of diagnosis (maternal first cousin)    Miscarriages / [place] Granddaughter    Hearing loss Granddaughter    Drug abuse Granddaughter    Depression Granddaughter     Social History Social History   Tobacco Use   Smoking status: Every Day    Types: E-cigarettes   Smokeless tobacco: Never   Tobacco comments:    Daily vaping; 1 cartridge lasts about a month  Vaping Use   Vaping status: Never Used  Substance Use Topics   Alcohol use: Not Currently    Comment: glass of wine occ   Drug use: Never   Allergies Penicillins, Somatropin, Tilactase, Tape, Indomethacin, Tolectin [tolmetin], Sulfa antibiotics, and Wound dressing adhesive  Review of Systems A thorough review of systems was obtained and all systems are negative except as noted in the HPI and PMH.   Physical Exam Vital Signs  I have reviewed the triage vital signs BP (!) 144/82 (BP Location: Left Arm)   Pulse 70   Temp 98 F (36.7 C) (Oral)   Resp 18   Ht 4' 10.5 (1.486 m)   Wt 70.3 kg   SpO2 97%   BMI 31.84 kg/m  Physical Exam Vitals and nursing note reviewed.  Constitutional:      General: She is not in acute distress.    Appearance: Normal appearance. She is well-developed. She is not ill-appearing.  HENT:     Head: Normocephalic and atraumatic.     Right Ear: External ear normal.     Left Ear: External ear normal.     Nose: Nose normal.     Mouth/Throat:     Mouth: Mucous membranes are moist.  Eyes:     General: No scleral icterus.       Right eye: No discharge.        Left eye: No discharge.     Extraocular Movements: Extraocular movements intact.     Pupils: Pupils are equal, round, and reactive to light.  Cardiovascular:     Rate and Rhythm: Normal rate.  Pulmonary:     Effort: Pulmonary effort is normal. No respiratory distress.     Breath sounds: No stridor.  Abdominal:     General: Abdomen is flat. There is no distension.     Tenderness: There is no guarding.  Musculoskeletal:        General: No deformity.     Cervical  back: No rigidity.  Skin:    General: Skin is warm and dry.     Coloration: Skin is not cyanotic, jaundiced or pale.  Neurological:     Mental Status: She is alert and oriented to person, place, and time.     GCS: GCS eye subscore is 4. GCS verbal subscore is 5. GCS motor subscore is 6.     Cranial Nerves: Cranial nerves 2-12 are intact.     Sensory: Sensation is intact.     Motor: Motor function is intact.     Coordination: Coordination is intact.     Comments: Gait testing deferred secondary to patient safety. Strength 5/5 to BLUE/BLLE, equal and symmetric    Psychiatric:        Speech: Speech normal.        Behavior: Behavior normal. Behavior is cooperative.     ED Results and Treatments Labs (all  labs ordered are listed, but only abnormal results are displayed) Labs Reviewed  CBC - Abnormal; Notable for the following components:      Result Value   WBC 11.7 (*)    Platelets 143 (*)    All other components within normal limits  BASIC METABOLIC PANEL WITH GFR                                                                                                                          Radiology CT Head Wo Contrast Result Date: 07/27/2023 CLINICAL DATA:  Head trauma EXAM: CT HEAD WITHOUT CONTRAST CT CERVICAL SPINE WITHOUT CONTRAST TECHNIQUE: Multidetector CT imaging of the head and cervical spine was performed following the standard protocol without intravenous contrast. Multiplanar CT image reconstructions of the cervical spine were also generated. RADIATION DOSE REDUCTION: This exam was performed according to the departmental dose-optimization program which includes automated exposure control, adjustment of the mA and/or kV according to patient size and/or use of iterative reconstruction technique. COMPARISON:  MRI 04/15/2022 FINDINGS: CT HEAD FINDINGS Brain: No acute territorial infarction or intracranial mass. Mild atrophy. Minimal periventricular white matter hypodensity likely  chronic small vessel ischemic change. The ventricles are nonenlarged. Subtle asymmetrical thickening along the right tentorium, best seen on coronal and sagittal views, coronal series 4, image 41, sagittal series 5, image 25, this measures 3 mm maximum thickness. No mass effect or midline shift. Vascular: No hyperdense vessels.  Carotid vascular calcification Skull: Normal. Negative for fracture or focal lesion. Sinuses/Orbits: No acute finding. Other: Traumatic Brain Injury Risk Stratification Skull Fracture: No - Low/mBIG 1 Subdural Hematoma (SDH): No - Low Subarachnoid Hemorrhage [facility]): No Epidural Hematoma (EDH): No - Low/mBIG 1 Cerebral contusion, intra-axial, intraparenchymal Hemorrhage (IPH): No Intraventricular Hemorrhage (IVH): No - Low/mBIG 1 Midline Shift > 1mm or Edema/effacement of sulci/vents: No - Low/mBIG 1 ---------------------------------------------------- CT CERVICAL SPINE FINDINGS Alignment: Straightening of the cervical spine. No subluxation. Facet alignment is normal Skull base and vertebrae: No acute fracture. No primary bone lesion or focal pathologic process. Soft tissues and spinal canal: No prevertebral fluid or swelling. No visible canal hematoma. Disc levels: Advanced disc space narrowing and degenerative change C3-C4 and C7-T1. Multilevel facet degenerative changes with foraminal narrowing. Upper chest: Negative. Other: None IMPRESSION: 1. Subtle asymmetrical thickening along the right tentorium, suspicious for trace subdural hematoma, measuring 3 mm maximum thickness. No significant mass effect. 2. Atrophy and chronic small vessel ischemic changes of the white matter. 3. Straightening of the cervical spine with multilevel degenerative changes. No acute osseous abnormality. Critical Value/emergent results were called by telephone at the time of interpretation on 07/27/2023 at 9:58 pm to provider JAYSON PEREYRA , who verbally acknowledged these results. Electronically Signed   By: Luke Bun M.D.   On: 07/27/2023 21:58   CT Cervical Spine Wo Contrast Result Date: 07/27/2023 CLINICAL DATA:  Head trauma EXAM: CT HEAD WITHOUT CONTRAST CT CERVICAL SPINE WITHOUT CONTRAST TECHNIQUE: Multidetector CT imaging of the head  and cervical spine was performed following the standard protocol without intravenous contrast. Multiplanar CT image reconstructions of the cervical spine were also generated. RADIATION DOSE REDUCTION: This exam was performed according to the departmental dose-optimization program which includes automated exposure control, adjustment of the mA and/or kV according to patient size and/or use of iterative reconstruction technique. COMPARISON:  MRI 04/15/2022 FINDINGS: CT HEAD FINDINGS Brain: No acute territorial infarction or intracranial mass. Mild atrophy. Minimal periventricular white matter hypodensity likely chronic small vessel ischemic change. The ventricles are nonenlarged. Subtle asymmetrical thickening along the right tentorium, best seen on coronal and sagittal views, coronal series 4, image 41, sagittal series 5, image 25, this measures 3 mm maximum thickness. No mass effect or midline shift. Vascular: No hyperdense vessels.  Carotid vascular calcification Skull: Normal. Negative for fracture or focal lesion. Sinuses/Orbits: No acute finding. Other: Traumatic Brain Injury Risk Stratification Skull Fracture: No - Low/mBIG 1 Subdural Hematoma (SDH): No - Low Subarachnoid Hemorrhage [facility]): No Epidural Hematoma (EDH): No - Low/mBIG 1 Cerebral contusion, intra-axial, intraparenchymal Hemorrhage (IPH): No Intraventricular Hemorrhage (IVH): No - Low/mBIG 1 Midline Shift > 1mm or Edema/effacement of sulci/vents: No - Low/mBIG 1 ---------------------------------------------------- CT CERVICAL SPINE FINDINGS Alignment: Straightening of the cervical spine. No subluxation. Facet alignment is normal Skull base and vertebrae: No acute fracture. No primary bone lesion or focal pathologic  process. Soft tissues and spinal canal: No prevertebral fluid or swelling. No visible canal hematoma. Disc levels: Advanced disc space narrowing and degenerative change C3-C4 and C7-T1. Multilevel facet degenerative changes with foraminal narrowing. Upper chest: Negative. Other: None IMPRESSION: 1. Subtle asymmetrical thickening along the right tentorium, suspicious for trace subdural hematoma, measuring 3 mm maximum thickness. No significant mass effect. 2. Atrophy and chronic small vessel ischemic changes of the white matter. 3. Straightening of the cervical spine with multilevel degenerative changes. No acute osseous abnormality. Critical Value/emergent results were called by telephone at the time of interpretation on 07/27/2023 at 9:58 pm to provider JAYSON PEREYRA , who verbally acknowledged these results. Electronically Signed   By: Luke Bun M.D.   On: 07/27/2023 21:58    Pertinent labs & imaging results that were available during my care of the patient were reviewed by me and considered in my medical decision making (see MDM for details).  Medications Ordered in ED Medications  acetaminophen  (TYLENOL ) tablet 1,000 mg (has no administration in time range)                                                                                                                                     Procedures .Critical Care  Performed by: PEREYRA JAYSON LABOR, DO Authorized by: PEREYRA JAYSON LABOR, DO   Critical care provider statement:    Critical care time (minutes):  30   Critical care time was exclusive of:  Separately billable procedures and treating other patients   Critical care was necessary to treat or  prevent imminent or life-threatening deterioration of the following conditions:  CNS failure or compromise   Critical care was time spent personally by me on the following activities:  Development of treatment plan with patient or surrogate, discussions with consultants, evaluation of patient's response to  treatment, examination of patient, ordering and review of laboratory studies, ordering and review of radiographic studies, ordering and performing treatments and interventions, pulse oximetry, re-evaluation of patient's condition, review of old charts and obtaining history from patient or surrogate   (including critical care time)  Medical Decision Making / ED Course    Medical Decision Making:    Izabel Chim is a 78 y.o. female with past medical history as below, significant for LD, GERD, tobacco use, prediabetes who presents to the ED with complaint of fall, head injury . The complaint involves an extensive differential diagnosis and also carries with it a high risk of complications and morbidity.  Serious etiology was considered. Ddx includes but is not limited to: Differential diagnoses for head trauma includes subdural hematoma, epidural hematoma, acute concussion, traumatic subarachnoid hemorrhage, cerebral contusions, etc.   Complete initial physical exam performed, notably the patient was in NAD, resting comfortbaly.    Reviewed and confirmed nursing documentation for past medical history, family history, social history.  Vital signs reviewed.    Fall w/ head injury SDH > - traumatic SDH from GLF - no thinners, neuro non-focal, GCS 15, she has mild headache, give apap - NSGY recommends rpt CTH in 4-6 hrs, if neg can f/u with pcp > rpt ordered - spoke w/ daughter at bedside with whom pt lives       Handoff Dr Geroldine pending rpt CTH and recheck               Additional history obtained: -Additional history obtained from na -External records from outside source obtained and reviewed including: Chart review including previous notes, labs, imaging, consultation notes including  Home meds Primary care documentation   Lab Tests: -I ordered, reviewed, and interpreted labs.   The pertinent results include:   Labs Reviewed  CBC - Abnormal; Notable for the  following components:      Result Value   WBC 11.7 (*)    Platelets 143 (*)    All other components within normal limits  BASIC METABOLIC PANEL WITH GFR    Notable for labs stable  EKG   EKG Interpretation Date/Time:    Ventricular Rate:    PR Interval:    QRS Duration:    QT Interval:    QTC Calculation:   R Axis:      Text Interpretation:           Imaging Studies ordered: I ordered imaging studies including CTH CT cervical spine I independently visualized the following imaging with scope of interpretation limited to determining acute life threatening conditions related to emergency care; findings noted above I agree with the radiologist interpretation If any imaging was obtained with contrast I closely monitored patient for any possible adverse reaction a/w contrast administration in the emergency department   Medicines ordered and prescription drug management: Meds ordered this encounter  Medications   acetaminophen  (TYLENOL ) tablet 1,000 mg    -I have reviewed the patients home medicines and have made adjustments as needed   Consultations Obtained: I requested consultation with the NSGY,  and discussed lab and imaging findings as well as pertinent plan - they recommend: rpt  cth 4-6 hrs   Cardiac Monitoring: Continuous pulse oximetry  interpreted by myself, 98% on RA.    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 The report date is 05/09/2020     The Multi-Cancer + RNA Panel offered by [facility] includes sequencing and/or deletion/duplication analysis of the following 84 genes:  AIP*, ALK, APC*, ATM*, AXIN2*,    GERD (gastroesophageal reflux disease)    GIST (gastrointestinal stroma tumor), malignant, colon    s/p [facility] 2 [facility] [place] of [place] [facility] in [place]    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   Pre-diabetes    Pulmonary nodule, left 11/25/2020   6 mm LUL on CT 05/20/2020   Sclerosing adenosis of right breast    Tobacco use 08/05/2021      Dispostion: Disposition decision including need for hospitalization was considered, and patient disposition pending at time of sign out.    Final Clinical Impression(s) / ED Diagnoses Final diagnoses:  Traumatic subdural hematoma without loss of consciousness, initial encounter (HCC)        Elnor Jayson LABOR, DO 07/27/23 [id number]

## 2023-07-27 NOTE — Discharge Instructions (Signed)
 It was a pleasure caring for you today in the emergency department.  Please return to the emergency department for any worsening or worrisome symptoms.

## 2023-07-27 NOTE — ED Triage Notes (Signed)
 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 No LOC. Pt endorses swelling to crown of head/ right. No blood thinners. Pain 8/10. Pt ambulatory

## 2023-07-27 NOTE — ED Notes (Signed)
 Patient transported to CT

## 2023-07-28 ENCOUNTER — Emergency Department (HOSPITAL_COMMUNITY)

## 2023-07-28 DIAGNOSIS — I6201 Nontraumatic acute subdural hemorrhage: Secondary | ICD-10-CM | POA: Diagnosis not present

## 2023-07-28 NOTE — ED Provider Notes (Signed)
  Physical Exam  BP (!) 151/86 (BP Location: Left Arm)   Pulse (!) 56   Temp 98.1 F (36.7 C) (Oral)   Resp 20   Ht 4' 10.5 (1.486 m)   Wt 70.3 kg   SpO2 96%   BMI 31.84 kg/m   Physical Exam Vitals and nursing note reviewed.  Constitutional:      Appearance: Normal appearance.  HENT:     Head: Normocephalic and atraumatic.  Pulmonary:     Effort: Pulmonary effort is normal.  Neurological:     General: No focal deficit present.     Mental Status: She is alert and oriented to person, place, and time.     Procedures  Procedures  ED Course / MDM    Medical Decision Making Amount and/or Complexity of Data Reviewed Labs: ordered. Radiology: ordered.  Risk OTC drugs.   Care assumed from Dr. Elnor at shift change.  Patient presenting here after a fall and was found to have a subdural hematoma.  The finding was discussed with neurosurgery who is recommended a repeat CT scan at 2 AM.  This has been obtained and shows no interval change.  Patient remains GCS of 15 and is neurologically intact.  She will be discharged with as needed return.       Geroldine Berg, MD 07/28/23 [phone]

## 2023-08-03 NOTE — Telephone Encounter (Addendum)
 UPDATE: Called [employer] spoke to Olla and she was able to pull the patients information out of Epic and she will expedite the order and [employer] will call the patient for setup.

## 2023-08-04 DIAGNOSIS — B372 Candidiasis of skin and nail: Secondary | ICD-10-CM | POA: Diagnosis not present

## 2023-08-04 DIAGNOSIS — G4700 Insomnia, unspecified: Secondary | ICD-10-CM | POA: Diagnosis not present

## 2023-08-04 DIAGNOSIS — W19XXXA Unspecified fall, initial encounter: Secondary | ICD-10-CM | POA: Diagnosis not present

## 2023-08-04 DIAGNOSIS — G4733 Obstructive sleep apnea (adult) (pediatric): Secondary | ICD-10-CM | POA: Diagnosis not present

## 2023-08-11 DIAGNOSIS — G4733 Obstructive sleep apnea (adult) (pediatric): Secondary | ICD-10-CM | POA: Diagnosis not present

## 2023-08-21 DIAGNOSIS — M25561 Pain in right knee: Secondary | ICD-10-CM | POA: Diagnosis not present

## 2023-08-21 DIAGNOSIS — R531 Weakness: Secondary | ICD-10-CM | POA: Diagnosis not present

## 2023-08-21 DIAGNOSIS — R296 Repeated falls: Secondary | ICD-10-CM | POA: Diagnosis not present

## 2023-08-25 ENCOUNTER — Ambulatory Visit: Payer: 59 | Admitting: Physician Assistant

## 2023-09-04 ENCOUNTER — Other Ambulatory Visit: Payer: Self-pay

## 2023-09-04 DIAGNOSIS — D0511 Intraductal carcinoma in situ of right breast: Secondary | ICD-10-CM

## 2023-09-05 ENCOUNTER — Other Ambulatory Visit (HOSPITAL_COMMUNITY): Payer: Self-pay | Admitting: Oncology

## 2023-09-05 ENCOUNTER — Inpatient Hospital Stay: Payer: 59

## 2023-09-05 ENCOUNTER — Inpatient Hospital Stay: Payer: 59 | Attending: Hematology | Admitting: Oncology

## 2023-09-05 VITALS — BP 147/128 | HR 61 | Temp 97.8°F | Resp 16

## 2023-09-05 DIAGNOSIS — C49A4 Gastrointestinal stromal tumor of large intestine: Secondary | ICD-10-CM | POA: Diagnosis not present

## 2023-09-05 DIAGNOSIS — Z803 Family history of malignant neoplasm of breast: Secondary | ICD-10-CM | POA: Diagnosis not present

## 2023-09-05 DIAGNOSIS — Z800 Family history of malignant neoplasm of digestive organs: Secondary | ICD-10-CM | POA: Diagnosis not present

## 2023-09-05 DIAGNOSIS — D0511 Intraductal carcinoma in situ of right breast: Secondary | ICD-10-CM

## 2023-09-05 DIAGNOSIS — M8580 Other specified disorders of bone density and structure, unspecified site: Secondary | ICD-10-CM | POA: Diagnosis not present

## 2023-09-05 DIAGNOSIS — Z79810 Long term (current) use of selective estrogen receptor modulators (SERMs): Secondary | ICD-10-CM | POA: Diagnosis not present

## 2023-09-05 DIAGNOSIS — Z801 Family history of malignant neoplasm of trachea, bronchus and lung: Secondary | ICD-10-CM | POA: Insufficient documentation

## 2023-09-05 LAB — CBC WITH DIFFERENTIAL/PLATELET
Abs Immature Granulocytes: 0.05 K/uL (ref 0.00–0.07)
Basophils Absolute: 0.1 K/uL (ref 0.0–0.1)
Basophils Relative: 1 %
Eosinophils Absolute: 0.3 K/uL (ref 0.0–0.5)
Eosinophils Relative: 3 %
HCT: 39.9 % (ref 36.0–46.0)
Hemoglobin: 13.5 g/dL (ref 12.0–15.0)
Immature Granulocytes: 0 %
Lymphocytes Relative: 19 %
Lymphs Abs: 2.4 K/uL (ref 0.7–4.0)
MCH: 30.8 pg (ref 26.0–34.0)
MCHC: 33.8 g/dL (ref 30.0–36.0)
MCV: 90.9 fL (ref 80.0–100.0)
Monocytes Absolute: 0.8 K/uL (ref 0.1–1.0)
Monocytes Relative: 6 %
Neutro Abs: 9.2 K/uL — ABNORMAL HIGH (ref 1.7–7.7)
Neutrophils Relative %: 71 %
Platelets: 244 K/uL (ref 150–400)
RBC: 4.39 MIL/uL (ref 3.87–5.11)
RDW: 13.2 % (ref 11.5–15.5)
WBC: 12.8 K/uL — ABNORMAL HIGH (ref 4.0–10.5)
nRBC: 0 % (ref 0.0–0.2)

## 2023-09-05 LAB — COMPREHENSIVE METABOLIC PANEL WITH GFR
ALT: 15 U/L (ref 0–44)
AST: 16 U/L (ref 15–41)
Albumin: 4.1 g/dL (ref 3.5–5.0)
Alkaline Phosphatase: 54 U/L (ref 38–126)
Anion gap: 10 (ref 5–15)
BUN: 10 mg/dL (ref 8–23)
CO2: 24 mmol/L (ref 22–32)
Calcium: 9.2 mg/dL (ref 8.9–10.3)
Chloride: 108 mmol/L (ref 98–111)
Creatinine, Ser: 0.68 mg/dL (ref 0.44–1.00)
GFR, Estimated: >60 mL/min (ref >60)
Glucose, Bld: 95 mg/dL (ref 70–99)
Potassium: 3.6 mmol/L (ref 3.5–5.1)
Sodium: 142 mmol/L (ref 135–145)
Total Bilirubin: 0.8 mg/dL (ref 0.0–1.2)
Total Protein: 7 g/dL (ref 6.5–8.1)

## 2023-09-05 LAB — VITAMIN D 25 HYDROXY (VIT D DEFICIENCY, FRACTURES): Vit D, 25-Hydroxy: 46.84 ng/mL (ref 30–100)

## 2023-09-05 NOTE — Assessment & Plan Note (Addendum)
-   Reviewed DEXA scan from 02/28/2023: T-score -2.2, not significantly changed from the last DEXA scan. - Vitamin D  level is 40.4.  Continue calcium  and vitamin D  supplements.

## 2023-09-05 NOTE — Assessment & Plan Note (Addendum)
-   Based on very low risk disease and low percent metastatic great, imatinib  discontinued in October 2022. - Reviewed CT CAP from 02/28/2023: Stable postsurgical changes with no evidence of recurrence.  Fatty liver infiltration.  Other benign findings were discussed with the patient. - Will repeat imaging in 1 year.

## 2023-09-05 NOTE — Patient Instructions (Signed)
 Osteoporosis- T score -2.2  Continue Calcium  and Vitamin D .

## 2023-09-05 NOTE — Progress Notes (Signed)
 [facility] OFFICE PROGRESS NOTE  Gerome Tillman CROME, FNP  ASSESSMENT & PLAN:    Assessment & Plan Breast neoplasm, Tis (DCIS), right - She is tolerating tamoxifen  very well.  No significant side effects reported. - Mammogram from 02/28/2023: BI-RADS Category 2.  She will repeat screening mammogram in 1 year. - Reviewed labs today: Normal LFTs.  CBC grossly normal. - RTC 6 months for follow-up with repeat labs and exam.  GIST (gastrointestinal stroma tumor), malignant, colon - Based on very low risk disease and low percent metastatic great, imatinib  discontinued in October 2022. - Reviewed CT CAP from 02/28/2023: Stable postsurgical changes with no evidence of recurrence.  Fatty liver infiltration.  Other benign findings were discussed with the patient. - Will repeat imaging in 1 year. Osteopenia, unspecified location - Reviewed DEXA scan from 02/28/2023: T-score -2.2, not significantly changed from the last DEXA scan. - Vitamin D  level is 40.4.  Continue calcium  and vitamin D  supplements.  Orders Placed This Encounter  Procedures   MM DIAG BREAST TOMO BILATERAL    Standing Status:   Future    Expected Date:   03/07/2024    Expiration Date:   09/04/2024    Reason for Exam (SYMPTOM  OR DIAGNOSIS REQUIRED):   breast cancer monitoring    Preferred imaging location?:   [facility]   CT CHEST ABDOMEN PELVIS W CONTRAST    Standing Status:   Future    Expected Date:   03/07/2024    Expiration Date:   09/04/2024    If indicated for the ordered procedure, I authorize the administration of contrast media per Radiology protocol:   Yes    Does the patient have a contrast media/X-ray dye allergy?:   No    Preferred imaging location?:   [facility]    If indicated for the ordered procedure, I authorize the administration of oral contrast media per Radiology protocol:   Yes   CBC with Differential    Standing Status:   Future    Expected Date:   03/07/2024    Expiration  Date:   06/05/2024   Comprehensive metabolic panel    Standing Status:   Future    Expected Date:   03/07/2024    Expiration Date:   06/05/2024   Vitamin D  25 hydroxy    Standing Status:   Future    Expected Date:   03/07/2024    Expiration Date:   06/05/2024    INTERVAL HISTORY: Patient returns for follow-up.  Patient was evaluated in the emergency room on 07/27/2023 through 07/28/2023 for traumatic subdural hematoma without loss of consciousness.  Patient fell at home while taking out the recycling and hit her head on the cement.  Reports overall she is doing well.  Appetite is 75% energy levels are 50%.  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  Has chronic cough, headaches and numbness and burning in her hands.  Denies any new lumps or bumps, abdominal pain or changes in her stools.  We reviewed cbc. Cmp, vit d.  SUMMARY OF HEMATOLOGIC HISTORY: Oncology History  GIST (gastrointestinal stroma tumor), malignant, colon  01/17/2020 Initial Diagnosis   GIST (gastrointestinal stroma tumor), malignant, colon (HCC)   05/09/2020 Genetic Testing   Negative genetic testing:  No pathogenic variants detected on the [facility] Multi-Cancer + RNA panel. 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 The report date is 05/09/2020  The Multi-Cancer + RNA Panel offered by [facility] includes sequencing and/or deletion/duplication analysis of the following 84 genes:  AIP*, ALK, APC*, ATM*, AXIN2*, BAP1*, BARD1*, BLM*, BMPR1A*, BRCA1*, BRCA2*, BRIP1*, CASR, CDC73*, CDH1*, CDK4, CDKN1B*, CDKN1C*, CDKN2A, CEBPA, CHEK2*, CTNNA1*, DICER1*, DIS3L2*, EGFR, EPCAM, FH*, FLCN*, GATA2*, GPC3, GREM1, HOXB13, HRAS, KIT, MAX*, MEN1*, MET, MITF, MLH1*, MSH2*, MSH3*, MSH6*, MUTYH*, NBN*, NF1*, NF2*, NTHL1*, PALB2*, PDGFRA, PHOX2B, PMS2*, POLD1*, POLE*, POT1*, PRKAR1A*, PTCH1*, PTEN*, RAD50*, RAD51C*, RAD51D*, RB1*, RECQL4, RET, RUNX1*, SDHA*, SDHAF2*,  SDHB*, SDHC*, SDHD*, SMAD4*, SMARCA4*, SMARCB1*, SMARCE1*, STK11*, SUFU*, TERC, TERT, TMEM127*, Tp53*, TSC1*, TSC2*, VHL*, WRN*, and WT1.  RNA analysis is performed for * genes.      1. Right breast DCIS: - Had initial biopsy on 09/06/2018 one of the right breast in [place]  consistent with ADH. - On 11/06/2020, lumpectomy. - Pathology consistent with 1.5 cm DCIS, margins negative, intermediate grade, pTis P NX.  She did not receive radiation therapy.  Tamoxifen  started on 04/12/2021.   2. Social/family history: - She is retired Lawyer.  Quit smoking 3 years ago, smoked 1 pack/day. - Mother had colon cancer, brother had lung cancer.  Maternal cousin had ovarian/cervical cancer.  Daughter had ovarian/cervical/vaginal cancer.  Maternal aunt had breast cancer.  3.  Multifocal gastric GIST tumors: -CT CAP on 05/30/2019 at [facility] in [place]  shows 4 x 2 x 2 cm mass in fundus with no metastatic disease.  6 mm solitary pulmonary nodule in the left upper lobe. -On 06/19/2019-partial gastrectomy x2, hiatal hernia repair, esophageal myotomy - Pathology shows 2 gastrointestinal stromal tumors, smaller lesion measuring 0.7 cm and larger lesion measuring 4.7 cm, mitotic rate less than 5/18mm2 for both lesions, low-grade, very low risk, no lymph nodes found (PT 1 and PT 2 N0). -She was evaluated by Dr. Lamar Ka of medical oncology at [facility] and was started on adjuvant imatinib  400 mg daily. - She appears to be in the very low risk range with a 1.9% metastatic rate.  This prognostic assessment applies best to KIT negative or PDGFR positive GIST's, whereas SDH deficient GIST's are more unpredictable.  Unsure why she was started on imatinib .  Was possible that she was part of clinical trial.  CBC    Component Value Date/Time   WBC 12.8 (H) 09/05/2023 1343   RBC 4.39 09/05/2023 1343   HGB 13.5 09/05/2023 1343   HCT 39.9 09/05/2023 1343   PLT 244 09/05/2023 1343   MCV 90.9 09/05/2023 1343    MCH 30.8 09/05/2023 1343   MCHC 33.8 09/05/2023 1343   RDW 13.2 09/05/2023 1343   LYMPHSABS 2.4 09/05/2023 1343   MONOABS 0.8 09/05/2023 1343   EOSABS 0.3 09/05/2023 1343   BASOSABS 0.1 09/05/2023 1343       Latest Ref Rng & Units 09/05/2023    1:43 PM 07/27/2023   10:09 PM 01/20/2023    8:08 AM  CMP  Glucose 70 - 99 mg/dL 95  81  898   BUN 8 - 23 mg/dL 10  11  11    Creatinine 0.44 - 1.00 mg/dL 9.31  9.32  9.28   Sodium 135 - 145 mmol/L 142  144  138   Potassium 3.5 - 5.1 mmol/L 3.6  3.5  3.7   Chloride 98 - 111 mmol/L 108  108  102   CO2 22 - 32 mmol/L 24  24  27    Calcium  8.9 - 10.3 mg/dL 9.2  9.1  9.5   Total Protein 6.5 - 8.1 g/dL  7.0   6.7   Total Bilirubin 0.0 - 1.2 mg/dL 0.8   0.5   Alkaline Phos 38 - 126 U/L 54   45   AST 15 - 41 U/L 16   14   ALT 0 - 44 U/L 15   14      Lab Results  Component Value Date   VITAMINB12 175 (L) 03/25/2022    Vitals:   09/05/23 1446 09/05/23 1451  BP: (!) 157/106 (!) 147/128  Pulse: 61   Resp: 16   Temp: 97.8 F (36.6 C)   SpO2: 100%     Review of System:  Review of Systems  Constitutional:  Positive for malaise/fatigue.  Musculoskeletal:  Positive for falls and joint pain.  Neurological:  Positive for dizziness, tingling and sensory change.    Physical Exam: Physical Exam Constitutional:      Appearance: Normal appearance.  HENT:     Head: Normocephalic and atraumatic.  Eyes:     Pupils: Pupils are equal, round, and reactive to light.  Cardiovascular:     Rate and Rhythm: Normal rate and regular rhythm.     Heart sounds: Normal heart sounds. No murmur heard. Pulmonary:     Effort: Pulmonary effort is normal.     Breath sounds: Normal breath sounds. No wheezing.  Abdominal:     General: Bowel sounds are normal. There is no distension.     Palpations: Abdomen is soft.     Tenderness: There is no abdominal tenderness.  Musculoskeletal:        General: Normal range of motion.     Cervical back: Normal range of  motion.  Skin:    General: Skin is warm and dry.     Findings: No rash.  Neurological:     Mental Status: She is alert and oriented to person, place, and time.     Gait: Gait is intact.  Psychiatric:        Mood and Affect: Mood and affect normal.        Cognition and Memory: Memory normal.        Judgment: Judgment normal.      I spent 20 minutes dedicated to the care of this patient (face-to-face and non-face-to-face) on the date of the encounter to include what is described in the assessment and plan.,  Delon Hope, NP 09/05/2023 3:16 PM

## 2023-09-05 NOTE — Assessment & Plan Note (Addendum)
-   She is tolerating tamoxifen  very well.  No significant side effects reported. - Mammogram from 02/28/2023: BI-RADS Category 2.  She will repeat screening mammogram in 1 year. - Reviewed labs today: Normal LFTs.  CBC grossly normal. - RTC 6 months for follow-up with repeat labs and exam.

## 2023-09-11 DIAGNOSIS — G4733 Obstructive sleep apnea (adult) (pediatric): Secondary | ICD-10-CM | POA: Diagnosis not present

## 2023-09-12 ENCOUNTER — Other Ambulatory Visit: Payer: Self-pay | Admitting: *Deleted

## 2023-09-12 DIAGNOSIS — D0511 Intraductal carcinoma in situ of right breast: Secondary | ICD-10-CM

## 2023-09-12 MED ORDER — TAMOXIFEN CITRATE 20 MG PO TABS: 20.0000 mg | ORAL_TABLET | Freq: Every day | ORAL | 0 refills | Status: DC

## 2023-09-12 NOTE — Telephone Encounter (Signed)
Tamoxifen refill approved.  Patient is tolerating and is to continue therapy. 

## 2023-10-04 NOTE — Telephone Encounter (Signed)
 Late Entry: Fax came for patient from [facility] Compl[employer] stating the patient had not met her compliance at 45 days in. I reached out to the patient spoke to Per DPR her daughter Leeroy and she states the patient has been wearing her unit every night. I reached out to [employer] and robin confirmed that she has been improving in compliance since getting a new mask.

## 2023-11-23 ENCOUNTER — Telehealth: Payer: Self-pay

## 2023-11-28 NOTE — Progress Notes (Deleted)
 Sleep Medicine CONSULT Note    Date:  11/28/2023   ID:  Cassandra Fowler, DOB March 10, 1945, MRN [id number]  PCP:  Gerome Tillman CROME, FNP  Cardiologist: Diannah SHAUNNA Maywood, MD   No chief complaint on file.   History of Present Illness:  Cassandra Fowler is a 78 y.o. female who is being seen today for the evaluation of OSA at the request of Vishnu Mallipeddi, MD.  This is a 78yo female with a hx of Breast CA, achalasia, HLD, GERD, pre DM, snoring and excessive daytime sleepiness.  A HST was ordered by her Cardiologist for a Stop Bang score of 3 and this showed mild OSA with an AHI of 7.4/hr and O2 sat nadir of 83% with moderate snoring.  Her REM AHI was high at 18.9/hr consistent with moderate REM OSA. She underwent CPAP titration and was started on CPAP at 14cm H2O.  She is now referred for sleep medicine consultation for treatment of OSA.   She is doing well with her PAP device and thinks that she has gotten used to it.  She tolerates the mask and feels the pressure is adequate.  Since going on PAP seh feels rested in the am and has no significant daytime sleepiness.  She denies any significant mouth or nasal dryness or nasal congestion.  SHe does not think that he snores. An Epworth Sleepiness Scale score was calculated the office today and this endorsed at ### arguing against residual daytime sleepiness. Patient denies any episodes of bruxism, restless legs, No gagging hallucinations or cataplectic events.    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 The report date is 05/09/2020     The Multi-Cancer + RNA Panel offered by [facility] includes sequencing and/or deletion/duplication analysis of the following 84 genes:  AIP*, ALK, APC*, ATM*, AXIN2*,    GERD (gastroesophageal reflux disease)    GIST (gastrointestinal stroma tumor), malignant, colon    s/p resection of 2 tumors [place] [facility] [place] tumors in June 2021 at [facility] in [place]    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   Pre-diabetes    Pulmonary nodule, left 11/25/2020   6 mm LUL on CT 05/20/2020   Sclerosing adenosis of right breast    Tobacco use 08/05/2021    Past Surgical History:  Procedure Laterality Date   BREAST BIOPSY Right 09/06/2019   [facility]; evaluation of suspicious calcification of right breast; pathology with atypical ductal hyperplasia, focal cystic dilation of ducts with columnar cell change of epithelium, presence of microcalcifications.   BREAST BIOPSY Right 11/06/2020   Procedure: BREAST BIOPSY;  Surgeon: Mavis Anes, MD;  Location: [facility] ORS;  Service: General;  Laterality: Right;  BREAST LUMPECTOMY WITH RADIOFREQUENCY TAG IDENTIFICATION Right 11/06/2020   Procedure: BREAST LUMPECTOMY WITH RADIOFREQUENCY TAG IDENTIFICATION;  Surgeon: Mavis Anes, MD;  Location: [facility];  Service: General;  Laterality: Right;   CATARACT EXTRACTION Right 03/28/2017   [facility] [place]    CATARACT EXTRACTION Left 03/14/2017   [facility] [place]    COLONOSCOPY  01/28/2013   Dr. Debby JINNY Needy, [place], [place]; Hyperplastic polyp x2, sessile serrated adenoma x2, tubular adenoma with low-grade dysplasia x1.   Recommend repeat colonoscopy in 3 years.   COLONOSCOPY WITH PROPOFOL  N/A 04/02/2020   Surgeon: Shaaron Lamar HERO, MD; Diverticulosis in the sigmoid and descending colon, cecal AVM, three 5-8 millimeters polyps in the rectum, mid rectum, and ascending colon resected and retrieved.  Pathology with 2 tubular adenomas, 1 hyperplastic polyp.  Recommended repeat colonoscopy in 5 years if health permits.   ESOPHAGOGASTRODUODENOSCOPY  04/24/2019   [place] ; 2.4 cm gastric submucosal mass in the fundus consistent with GIST.  FNA consistent with GIST.   ESOPHAGOGASTRODUODENOSCOPY  01/28/2013   Dr. Debby JINNY Devers in [place], [place]; normal esophagus, hernia at GE junction, normal examined stomach, normal examined duodenum.  Does not appear dilation was performed.   ESOPHAGOGASTRODUODENOSCOPY (EGD) WITH PROPOFOL  N/A 04/02/2020   Surgeon: Shaaron Lamar HERO, MD;  Erosive reflux esophagitis, somewhat dilated distal esophagus s/p 60 French Maloney dilation, stigmata of prior gastric surgery, no evidence of recurrent/persisting GIST, normal examined duodenum.    GIST tumor resection  06/19/2019   [facility] [place] , robotic assisted laparoscopic partial gastrectomy x2.  Pathology consistent with GIST   HELLER MYOTOMY  06/19/2019   [facility] [place] ; esophageal myotomy with hiatal hernia repair and dor fundoplication   left hand surgery     MALONEY DILATION N/A 04/02/2020   Procedure: MALONEY DILATION;  Surgeon: Shaaron Lamar HERO, MD;  Location: [facility];  Service: Endoscopy;  Laterality: N/A;   POLYPECTOMY  04/02/2020   Procedure: POLYPECTOMY;  Surgeon: Shaaron Lamar HERO, MD;  Location: [facility];  Service: Endoscopy;;   tubal ligation     UMBILICAL HERNIA REPAIR     1970s   UPPER ESOPHAGEAL ENDOSCOPIC ULTRASOUND (EUS)  04/24/2019   [facility] [place] ; endoscopic findings: Tortuous esophagus s/p balloon dilation to 18 mm with no effect s/p biopsy, submucosal mass in the fundus,  normal duodenum; endosonographic findings: 2.4 cm x 2.4 cm submucosal mass in the fundus s/p FNA, cholelithiasis, no hepatic, CBD, or pancreatic abnormalities.  Pathology: GIST, hepatic parenchyma with mild steato-fibrosis, reflux esophagitis   YAG laser posterior capsulotomy Left 08/30/2018   [facility] [place] , left eye.    Current Medications: No outpatient medications have been marked as taking for the 11/29/23 encounter (Appointment) with Shlomo Wilbert SAUNDERS, MD.    Allergies:   Penicillins, Somatropin, Tilactase, Tape, Indomethacin, Tolectin [tolmetin], Sulfa antibiotics, and Wound dressing adhesive   Social History   Socioeconomic History   Marital status: Widowed    Spouse name: Not on file   Number of children: 1   Years of education: 12   Highest education level: High school graduate  Occupational History   Occupation: retired  Tobacco Use   Smoking status: Every Day    Types: E-cigarettes   Smokeless tobacco: Never   Tobacco comments:    Daily vaping; 1 cartridge lasts about a month  Vaping Use   Vaping status: Never Used  Substance and Sexual Activity   Alcohol use: Not Currently    Comment: glass of wine occ   Drug use:  Never   Sexual activity: Not Currently    Birth control/protection: Post-menopausal, Abstinence  Other Topics Concern   Not on file  Social History Narrative   Left handed   One child   Lives with daughter   Drinks caffeine prn   Mobile home   Social Drivers of Health   Financial Resource Strain: Not on file  Food Insecurity: Low Risk  (12/13/2022)   Received from [facility]   Hunger Vital Sign    Within the past 12 months, you worried that your food would run out before you got money to buy more: Never true    Within the past 12 months, the food you bought just didn't last and you didn't have money to get more. : Never true  Transportation Needs: No Transportation Needs (12/13/2022)   Received from [employer]    In the past 12 months, has lack of reliable transportation kept you from medical appointments, meetings, work or from getting things needed for daily living? : No  Physical Activity: Inactive (03/05/2020)   Exercise Vital Sign    Days of Exercise per Week: 0 days    Minutes of Exercise per Session: 0 min  Stress: Not on file  Social Connections: Not on file     Family History:  The patient's family history includes Breast cancer in her cousin, cousin, daughter, maternal aunt, and maternal aunt; Cancer in her mother; Colon cancer in her paternal grandmother; Dementia in her mother; Depression in her daughter, granddaughter, and maternal grandmother; Diabetes in her daughter and maternal grandfather; Drug abuse in her daughter and granddaughter; Hearing loss in her brother, daughter, father, granddaughter, and mother; Heart disease in her daughter, father, and mother; Hypertension in her daughter and mother; Kidney disease in her daughter; Lung cancer (age of onset: 77) in her brother; Miscarriages / Stillbirths in her daughter and granddaughter; Stroke in her father and mother; Uterine cancer in her daughter; Vaginal cancer in her daughter; Varicose Veins in her mother.   ROS:   Please see the history of present illness.    ROS All other systems reviewed and are negative.      No data to display             PHYSICAL EXAM:   VS:  There were no vitals taken for this visit.   GEN: Well nourished, well developed, in no acute distress  HEENT: normal  Neck: no JVD, carotid bruits, or masses Cardiac: RRR; no murmurs, rubs, or gallops,no edema.  Intact distal pulses bilaterally.  Respiratory:  clear to auscultation bilaterally, normal work of breathing GI: soft, nontender, nondistended, + BS MS: no deformity or atrophy  Skin: warm and dry, no rash Neuro:  Alert and Oriented x 3, Strength and sensation are intact Psych: euthymic mood, full affect  Wt Readings from Last 3  Encounters:  07/27/23 155 lb (70.3 kg)  07/25/23 155 lb (70.3 kg)  03/09/23 164 lb (74.4 kg)      Studies/Labs Reviewed:   HST, CPAP titration, PAP download  Recent Labs: 09/05/2023: ALT 15; BUN 10; Creatinine, Ser 0.68; Hemoglobin 13.5; Platelets 244; Potassium 3.6; Sodium 142     ASSESSMENT:    No diagnosis found.   PLAN:  In order of problems listed above:  OSA - The patient is tolerating PAP therapy well without any problems. The PAP download performed by his DME was personally reviewed and interpreted by me today and showed an AHI of ***/hr  on *** cm H2O with ***% compliance in using more than 4 hours nightly.  The patient has been using and benefiting from PAP use and will continue to benefit from therapy.   HTN -BP controlled on exam today -continue Losartan 50mg  daily with PRN refills  0ime Spent: 210 minutes total time of encounter, including 15 minutes spent in face-to-face patient care on the date of this encounter. This time includes coordination of care and counseling regarding above mentioned problem list. Remainder of non-face-to-face time involved reviewing chart documents/testing relevant to the patient encounter and documentation in the medical record. I have independently reviewed documentation from referring provider  Medication Adjustments/Labs and Tests Ordered: Current medicines are reviewed at length with the patient today.  Concerns regarding medicines are outlined above.  Medication changes, Labs and Tests ordered today are listed in the Patient Instructions below.  There are no Patient Instructions on file for this visit.   Signed, Wilbert Bihari, MD  11/28/2023 7:00 PM    [facility] [address] [place], [place], [place]  [address] Phone: [phone]; Fax: [phone]

## 2023-11-29 ENCOUNTER — Telehealth: Payer: Self-pay

## 2023-11-29 ENCOUNTER — Ambulatory Visit: Admitting: Cardiology

## 2023-11-29 DIAGNOSIS — G4733 Obstructive sleep apnea (adult) (pediatric): Secondary | ICD-10-CM

## 2023-11-29 DIAGNOSIS — I10 Essential (primary) hypertension: Secondary | ICD-10-CM

## 2023-11-29 NOTE — Progress Notes (Signed)
 Update: MLH1 c.973C>T VUS reclassified to benign. Report date is 09/12/2023.

## 2023-11-29 NOTE — Telephone Encounter (Signed)
 Spoke to patient and pt daughter, advised that Medicare does not cover virtual visits anymore and this needs to be a in person appointment. Agreed with plan of care.   Scheduling, please contact patient to schedule a in person visit for sleep clinic with Dr. Shlomo. Thank you!

## 2023-11-29 NOTE — Telephone Encounter (Signed)
 Left message to call back.

## 2023-11-29 NOTE — Telephone Encounter (Signed)
 Left message to call back to prepare for virtual visit.

## 2023-12-12 ENCOUNTER — Telehealth: Payer: Self-pay

## 2023-12-12 NOTE — Progress Notes (Signed)
   12/12/2023  Patient ID: Cassandra Fowler, female   DOB: 1945/03/01, 78 y.o.   MRN: [id number]  Contacted patient regarding medication adherence from a quality report. The patient is listed as at-risk MAC for 2025.    Per [facility] and payer portal fill history:  1. Rosuvastatin  10 mg - last filled 09/07/23 for a 30-day supply.   I spoke to the patient's daughter, Leeroy, who helps manage her medications. She authorized a refill of the rosuvastatin . I will assist them in coordinating the refill with her pharmacy.  Thank you for allowing pharmacy to be a part of this patient's care.   Heather Factor, PharmD Clinical Pharmacist  [phone]

## 2023-12-12 NOTE — Progress Notes (Signed)
   12/12/2023  Patient ID: Cassandra Fowler, female   DOB: 09/06/1945, 78 y.o.   MRN: [id number]  [facility] confirmed that the rosuvastatin  was discontinued by prescriber Jenkins Pizza, FNP in September. I will update the patient's daughter. The patient will fail the MAC measure.   Heather Factor, PharmD Clinical Pharmacist  [phone]

## 2023-12-14 ENCOUNTER — Other Ambulatory Visit: Payer: Self-pay | Admitting: Oncology

## 2023-12-14 ENCOUNTER — Other Ambulatory Visit: Payer: Self-pay | Admitting: *Deleted

## 2023-12-14 DIAGNOSIS — D0511 Intraductal carcinoma in situ of right breast: Secondary | ICD-10-CM

## 2023-12-14 NOTE — Telephone Encounter (Signed)
 Patient tolerating Tamoxifen .  Per OVN she is to continue therapy.

## 2024-01-08 ENCOUNTER — Encounter: Payer: Self-pay | Admitting: *Deleted

## 2024-01-15 ENCOUNTER — Ambulatory Visit: Attending: Internal Medicine | Admitting: Internal Medicine

## 2024-01-15 NOTE — Progress Notes (Signed)
 Erroneous encounter - please disregard.

## 2024-01-16 ENCOUNTER — Encounter: Payer: Self-pay | Admitting: Internal Medicine

## 2024-02-06 ENCOUNTER — Other Ambulatory Visit: Payer: Self-pay

## 2024-02-06 ENCOUNTER — Emergency Department (HOSPITAL_COMMUNITY)

## 2024-02-06 ENCOUNTER — Encounter (HOSPITAL_COMMUNITY): Payer: Self-pay | Admitting: Emergency Medicine

## 2024-02-06 ENCOUNTER — Emergency Department (HOSPITAL_COMMUNITY)
Admission: EM | Admit: 2024-02-06 | Discharge: 2024-02-06 | Disposition: A | Discharge status: Home / Self Care | Attending: Emergency Medicine | Admitting: Emergency Medicine

## 2024-02-06 DIAGNOSIS — W000XXA Fall on same level due to ice and snow, initial encounter: Secondary | ICD-10-CM | POA: Insufficient documentation

## 2024-02-06 DIAGNOSIS — W19XXXA Unspecified fall, initial encounter: Secondary | ICD-10-CM

## 2024-02-06 DIAGNOSIS — S0990XA Unspecified injury of head, initial encounter: Secondary | ICD-10-CM | POA: Insufficient documentation

## 2024-02-06 DIAGNOSIS — F17200 Nicotine dependence, unspecified, uncomplicated: Secondary | ICD-10-CM | POA: Diagnosis not present

## 2024-02-06 MED ORDER — LORATADINE 10 MG PO TABS
10.0000 mg | ORAL_TABLET | Freq: Every day | ORAL | Status: DC
  Administered 2024-02-06: 10 mg via ORAL
  Filled 2024-02-06: qty 1

## 2024-02-06 NOTE — ED Notes (Signed)
 Granddaughter Dianah) requests update upon patient dispo

## 2024-02-06 NOTE — ED Notes (Signed)
 Pt was given a walker, as she walks with one at baseline. She proceeded to exit the bed and walk to the bathroom (using the walker) without any assistance. No abnormalities with gait or new distress noted.

## 2024-02-06 NOTE — Discharge Instructions (Signed)
 Avoid strenuous activity for 1 week.  You may take Tylenol  as directed for pain.  Please follow-up with your primary care provider for recheck.  Return to emergency department for any new or worsening symptoms

## 2024-02-06 NOTE — ED Triage Notes (Signed)
 Pt was outside smoking and slipped on ice. Pt is ambulatory and has no complaints.

## 2024-02-16 ENCOUNTER — Encounter: Payer: Self-pay | Admitting: Internal Medicine

## 2024-03-01 ENCOUNTER — Institutional Professional Consult (permissible substitution): Payer: 59 | Admitting: Psychology

## 2024-03-01 ENCOUNTER — Ambulatory Visit: Payer: Self-pay

## 2024-03-04 ENCOUNTER — Ambulatory Visit: Admitting: Cardiology

## 2024-03-05 ENCOUNTER — Encounter (HOSPITAL_COMMUNITY)

## 2024-03-05 ENCOUNTER — Other Ambulatory Visit (HOSPITAL_COMMUNITY)

## 2024-03-05 ENCOUNTER — Inpatient Hospital Stay

## 2024-03-11 ENCOUNTER — Encounter: Payer: 59 | Admitting: Psychology

## 2024-03-12 ENCOUNTER — Inpatient Hospital Stay: Admitting: Oncology

## 2024-03-12 ENCOUNTER — Ambulatory Visit: Admitting: Oncology
